# Patient Record
Sex: Male | Born: 1940 | Race: Black or African American | Hispanic: No | Marital: Married | Smoking: Former smoker
Health system: Southern US, Community
[De-identification: ages and names within clinical notes are randomized; demographics above are authoritative.]

## PROBLEM LIST (undated history)

## (undated) DIAGNOSIS — R011 Cardiac murmur, unspecified: Secondary | ICD-10-CM

## (undated) DIAGNOSIS — I639 Cerebral infarction, unspecified: Secondary | ICD-10-CM

## (undated) DIAGNOSIS — I219 Acute myocardial infarction, unspecified: Secondary | ICD-10-CM

## (undated) DIAGNOSIS — I351 Nonrheumatic aortic (valve) insufficiency: Secondary | ICD-10-CM

## (undated) DIAGNOSIS — J449 Chronic obstructive pulmonary disease, unspecified: Secondary | ICD-10-CM

## (undated) DIAGNOSIS — E559 Vitamin D deficiency, unspecified: Secondary | ICD-10-CM

## (undated) DIAGNOSIS — Z961 Presence of intraocular lens: Secondary | ICD-10-CM

## (undated) DIAGNOSIS — I10 Essential (primary) hypertension: Secondary | ICD-10-CM

## (undated) DIAGNOSIS — Z9981 Dependence on supplemental oxygen: Secondary | ICD-10-CM

## (undated) DIAGNOSIS — I340 Nonrheumatic mitral (valve) insufficiency: Secondary | ICD-10-CM

## (undated) DIAGNOSIS — R918 Other nonspecific abnormal finding of lung field: Secondary | ICD-10-CM

## (undated) DIAGNOSIS — I712 Thoracic aortic aneurysm, without rupture, unspecified: Secondary | ICD-10-CM

## (undated) DIAGNOSIS — I722 Aneurysm of renal artery: Secondary | ICD-10-CM

## (undated) DIAGNOSIS — C3490 Malignant neoplasm of unspecified part of unspecified bronchus or lung: Secondary | ICD-10-CM

## (undated) DIAGNOSIS — T8182XA Emphysema (subcutaneous) resulting from a procedure, initial encounter: Secondary | ICD-10-CM

## (undated) DIAGNOSIS — I4891 Unspecified atrial fibrillation: Secondary | ICD-10-CM

## (undated) DIAGNOSIS — J189 Pneumonia, unspecified organism: Secondary | ICD-10-CM

## (undated) DIAGNOSIS — I671 Cerebral aneurysm, nonruptured: Secondary | ICD-10-CM

## (undated) DIAGNOSIS — I509 Heart failure, unspecified: Secondary | ICD-10-CM

## (undated) DIAGNOSIS — I723 Aneurysm of iliac artery: Secondary | ICD-10-CM

## (undated) HISTORY — DX: Cerebral aneurysm, nonruptured: I67.1

## (undated) HISTORY — DX: Thoracic aortic aneurysm, without rupture, unspecified: I71.20

## (undated) HISTORY — DX: Heart failure, unspecified: I50.9

## (undated) HISTORY — DX: Essential (primary) hypertension: I10

## (undated) HISTORY — PX: CORONARY ANGIOPLASTY WITH STENT PLACEMENT: SHX49

## (undated) HISTORY — PX: AORTIC VALVE REPLACEMENT: SHX41

## (undated) HISTORY — DX: Other nonspecific abnormal finding of lung field: R91.8

## (undated) HISTORY — DX: Presence of intraocular lens: Z96.1

## (undated) HISTORY — DX: Nonrheumatic aortic (valve) insufficiency: I35.1

## (undated) HISTORY — DX: Aneurysm of iliac artery: I72.3

## (undated) HISTORY — DX: Unspecified atrial fibrillation: I48.91

## (undated) HISTORY — DX: Aneurysm of renal artery: I72.2

## (undated) HISTORY — PX: INGUINAL HERNIA REPAIR: SUR1180

## (undated) HISTORY — PX: CATARACT EXTRACTION W/ INTRAOCULAR LENS  IMPLANT, BILATERAL: SHX1307

## (undated) HISTORY — DX: Emphysema (subcutaneous) resulting from a procedure, initial encounter: T81.82XA

## (undated) HISTORY — DX: Nonrheumatic mitral (valve) insufficiency: I34.0

## (undated) HISTORY — DX: Cerebral infarction, unspecified: I63.9

## (undated) HISTORY — PX: CARDIAC VALVE REPLACEMENT: SHX585

## (undated) HISTORY — DX: Thoracic aortic aneurysm, without rupture: I71.2

---

## 1997-11-12 ENCOUNTER — Encounter: Payer: Self-pay | Admitting: Internal Medicine

## 1997-11-12 ENCOUNTER — Ambulatory Visit (HOSPITAL_COMMUNITY): Admission: RE | Admit: 1997-11-12 | Discharge: 1997-11-12 | Payer: Self-pay | Admitting: Internal Medicine

## 2005-06-01 ENCOUNTER — Inpatient Hospital Stay (HOSPITAL_COMMUNITY): Admission: EM | Admit: 2005-06-01 | Discharge: 2005-06-04 | Payer: Self-pay | Admitting: Emergency Medicine

## 2007-12-12 ENCOUNTER — Encounter: Admission: RE | Admit: 2007-12-12 | Discharge: 2007-12-12 | Payer: Self-pay | Admitting: Family Medicine

## 2007-12-15 ENCOUNTER — Encounter: Admission: RE | Admit: 2007-12-15 | Discharge: 2007-12-15 | Payer: Self-pay | Admitting: Family Medicine

## 2010-02-02 ENCOUNTER — Encounter: Payer: Self-pay | Admitting: Neurosurgery

## 2010-05-30 NOTE — Discharge Summary (Signed)
NAMESHAILEN, THIELEN NO.:  [id number]   MEDICAL RECORD NO.:  [id number]          PATIENT TYPE:  INP   LOCATION:  3101                         FACILITY:  [facility]   PHYSICIAN:  Clydene Fake, M.D.  DATE OF BIRTH:  07-07-1940   DATE OF ADMISSION:  06/01/2005  DATE OF DISCHARGE:  06/04/2005                                 DISCHARGE SUMMARY   DIAGNOSIS:  Cerebellar intracranial hemorrhage.   REASON FOR ADMISSION:  This 70 year old gentleman felt some headache,  dizziness, and ataxia.  Went to the primary medical office and found to have  high blood pressure.  Given some Lasix and sent to the ER.  Blood pressure  was controlled.  CT was done showing a left cerebellar hemorrhage, minimal  mass effect to the left side of the vermis.  The patient was admitted to ICU  for observation.   Medicine consulted for blood pressure control.  The patient continued to be  neurologically stable.  CT scan of the head was repeated on Jun 03, 2005  showing no change in the hemorrhage, except there were a little slight edema  around it, basically as expected evolution, no new mass effect, no  hydrocephalus.  Blood pressure was controlled.  Medicines were changed to  hydrochlorothiazide 25 daily and Cozaar 100 mg daily.  He remained stable.  He has been up ambulating, neurologically intact, and we will discharge him  home in fair condition.  Follow up with primary doctor in one week for  continued watching of his blood pressure, that is, Dr. Parke Simmers, and to get an  MRI with and without contract and an MRA of his brain in about one month  when the clot resolves to look for any underlying lesions that may have  caused this, but most likely it was uncontrolled hypertension causing this  hemorrhage.           ______________________________  Clydene Fake, M.D.     JRH/MEDQ  D:  06/04/2005  T:  06/04/2005  Job:  [id number]   cc:   [facility]   Renaye Rakers, M.D.  Fax:  [phone]

## 2010-05-30 NOTE — Consult Note (Signed)
[name], Lane NO.:  [id number]   MEDICAL RECORD NO.:  [id number]          PATIENT TYPE:  INP   LOCATION:  1823                         FACILITY:  [facility]   PHYSICIAN:  Lonia Blood, M.D.DATE OF BIRTH:  July 17, 1940   DATE OF CONSULTATION:  06/01/2005  DATE OF DISCHARGE:                                   CONSULTATION   INTERNAL MEDICINE CONSULTATION   DATE OF CONSULTATION:  Jun 01, 2005.   PRIMARY CARE PHYSICIAN:  Renaye Rakers, M.D.   REASON FOR CONSULTATION:  Hypertensive crisis in the setting of intracranial  hemorrhage.   HISTORY OF PRESENT ILLNESS:  Cody Lane. Cody Lane is a very pleasant 70-year-  old gentleman with a long standing history of hypertension and tobacco  abuse.  He was in his usual state of health when he awoke this morning.  He  went to work.  At work he began to suddenly feel significantly off balance  and dizzy.  He tried to relax at work and this did not improve his symptoms.  He then left work and went to see his primary care physician.  There, he was  found to have an elevated blood pressure, though the exact measurement is  not available to me at this time. He was apparently treated in the office  with an antihypertensive and told to go home for the day to relax.  He did  return to work instead but reports that his intention was to only be there  temporarily to pick up a few things.  Nonetheless, upon returning to work he  felt the symptoms had gotten much worse.  As a result, he then presented to  the emergency room for evaluation.  Work up in the emergency room has  included a CT scan which has revealed a cerebellar hemorrhage.  Neurosurgery  has evaluated the patient.  The patient is being admitted to the  neurological intensive care unit for close observation.  It is not clear at  this time if this represents simple hypertensive intracranial hemorrhage or  if there is other pathology at play.  Nonetheless, [facility]  have been consulted, as a representative of Dr. Parke Simmers, to assist with the  acute control of the patient's hypertension.   At the present time the patient is resting comfortably in the emergency room  bed.  He remarkably has no headache. He does continue to feel somewhat  dizzy.  He has no chest pain, no shortness of breath, no fever, chills, no  nausea, vomiting.  He has no other neurological symptoms.   REVIEW OF SYSTEMS:  Comprehensive review of systems is otherwise  unremarkable with the exception of the elements noted in the history of  present illness above.   PAST MEDICAL HISTORY:  1.  Hypertension.  2.  Tobacco abuse in the amount of one pack per day since teen years.  3.  Borderline alcohol use with two shots of [place] daily.   OUTPATIENT MEDICATIONS:  1.  Lexapro 20 mg daily.  2.  Micardis/hydrochlorothiazide 80/12.5 mg daily.   ALLERGIES:  No known drug allergies.   FAMILY HISTORY:  The patient's father is deceased secondary to  cerebrovascular accident in his 57's.  The patient's mother died at age 48  due to a stroke.   SOCIAL HISTORY:  The patient lives in [place].  He inspects cars for a  living.  He is married. He has no children.   LABORATORY DATA:  Hemoglobin, white count, platelet count and MCV are all  normal.  Coag's are normal.  Point of care cardiac markers are negative x1.  Sodium, potassium, chloride, bicarb, BUN, and serum glucose are all normal.  Creatinine is borderline at 1.3, though this likely represents a normal GFR  in this large gentleman.   PHYSICAL EXAMINATION:  VITAL SIGNS:  Temperature 96.7, blood pressure  187/120 initially and now 154/75.  Heart rate 75.  Respiratory rate 16.  Oxygen saturation 87% on room air initially, now 93% on 2L per minute nasal  cannula.  HEENT:  Normocephalic, atraumatic. Pupils equal, round, reactive to light  and accommodation. Extraocular movements intact, bilaterally.  NECK:  No  lymphadenopathy, no jugular venous distention.  LUNGS:  Clear to auscultation bilaterally without wheezes or rhonchi.  CARDIOVASCULAR:  Regular rate and rhythm without appreciable murmur.  ABDOMEN:  Nontender, nondistended.  Soft, bowel sounds present. No  hepatosplenomegaly.  No rebound.  No ascites.  EXTREMITIES:  No significant cyanosis, clubbing or edema bilateral lower  extremities.  NEUROLOGICAL:  5/5 strength bilateral upper extremities and lower  extremities.  Intact sensation to touch throughout.  No Babinski.  Cranial  nerves II-XII are actually intact bilaterally.   ASSESSMENT AND RECOMMENDATIONS:  1.  Hypertensive crisis.  The patient has been dosed with intravenous      Lopressor during his emergency room stay.  This has done a great job in      controlling his blood pressure.  In the setting of long term      hypertension with an acute intracranial hemorrhage, we do not wish to      over-correct the patient's elevated blood pressure.  Our goal will be to      maintain his systolic blood pressure in the 140-160 range.  As the      patient's blood pressure has currently become well controlled on      intravenous beta blocker, I see no reason to change that treatment plan      at the present time.  I agree with continuing his p.o. Micardis.  I      agree with ART line for the most reliable and accurate blood pressure      monitoring.  If the patient's blood pressure becomes difficult to      control on his current regimen, my recommendation would be to initiate      intravenous nitroglycerin to allow for more aggressive control.  If this      fails, my next step would be Nipride.  2.  Mild hypoxia.  At presentation, the patient had an oxygen saturation of      87% on room air.  There is no clear reason for this.  At present he is      in the 93 to 95% range on 2L per minute nasal cannula.  There is no      evidence of jugular venous distention to suggest congestive heart      failure.  The possibility of some pulmonary edema related to the      patient's uncontrolled hypertension is  possible.  Physical examination,      however, does not reveal significant crackles.  We will simply monitor      his oxygen saturation's and followup with a chest x-ray in the morning.  3.  Tobacco abuse.  I have counseled the patient on the need to discontinue      tobacco use.  Tobacco cessation consultation will be requested.  4.  Marginal alcohol use. I have counseled the patient that one shot of      liquor a day would be a more appropriate usage and would help him avoid      potential long term side effects of excessive alcohol use.  He does not,      however, display symptoms of true alcoholism at the present time.      Lonia Blood, M.D.  Electronically Signed     JTM/MEDQ  D:  06/01/2005  T:  06/01/2005  Job:  [id number]   cc:   Renaye Rakers, M.D.  Fax: [phone]

## 2011-07-23 DIAGNOSIS — I671 Cerebral aneurysm, nonruptured: Secondary | ICD-10-CM

## 2011-07-23 HISTORY — DX: Cerebral aneurysm, nonruptured: I67.1

## 2013-08-17 DIAGNOSIS — Z961 Presence of intraocular lens: Secondary | ICD-10-CM

## 2013-08-17 HISTORY — DX: Presence of intraocular lens: Z96.1

## 2014-01-26 DIAGNOSIS — I340 Nonrheumatic mitral (valve) insufficiency: Secondary | ICD-10-CM

## 2014-01-26 DIAGNOSIS — I351 Nonrheumatic aortic (valve) insufficiency: Secondary | ICD-10-CM

## 2014-01-26 DIAGNOSIS — I509 Heart failure, unspecified: Secondary | ICD-10-CM

## 2014-01-26 HISTORY — DX: Nonrheumatic aortic (valve) insufficiency: I35.1

## 2014-01-26 HISTORY — DX: Nonrheumatic mitral (valve) insufficiency: I34.0

## 2014-01-26 HISTORY — DX: Heart failure, unspecified: I50.9

## 2014-03-26 ENCOUNTER — Encounter: Payer: Self-pay | Admitting: *Deleted

## 2014-03-26 ENCOUNTER — Non-Acute Institutional Stay (SKILLED_NURSING_FACILITY): Payer: Medicare Other | Admitting: Adult Health

## 2014-03-26 ENCOUNTER — Encounter: Payer: Self-pay | Admitting: Adult Health

## 2014-03-26 DIAGNOSIS — Z7901 Long term (current) use of anticoagulants: Secondary | ICD-10-CM

## 2014-03-26 DIAGNOSIS — I10 Essential (primary) hypertension: Secondary | ICD-10-CM | POA: Diagnosis not present

## 2014-03-26 DIAGNOSIS — R5381 Other malaise: Secondary | ICD-10-CM | POA: Diagnosis not present

## 2014-03-26 DIAGNOSIS — G4700 Insomnia, unspecified: Secondary | ICD-10-CM

## 2014-03-26 DIAGNOSIS — I509 Heart failure, unspecified: Secondary | ICD-10-CM

## 2014-03-26 DIAGNOSIS — I4891 Unspecified atrial fibrillation: Secondary | ICD-10-CM | POA: Diagnosis not present

## 2014-03-26 DIAGNOSIS — I351 Nonrheumatic aortic (valve) insufficiency: Secondary | ICD-10-CM | POA: Diagnosis not present

## 2014-03-26 DIAGNOSIS — E785 Hyperlipidemia, unspecified: Secondary | ICD-10-CM | POA: Insufficient documentation

## 2014-03-26 DIAGNOSIS — N2889 Other specified disorders of kidney and ureter: Secondary | ICD-10-CM | POA: Insufficient documentation

## 2014-03-26 DIAGNOSIS — I4821 Permanent atrial fibrillation: Secondary | ICD-10-CM | POA: Insufficient documentation

## 2014-03-26 NOTE — Progress Notes (Signed)
Patient ID: Cody Lane, male   DOB: Sep 06, 1940, 74 y.o.   MRN: [id number]   03/26/2014  Facility:  [facility] Location:  [place] Room Number: 1204-P LEVEL OF CARE:  SNF (31)   Chief Complaint  Patient presents with  . Hospitalization Follow-up    Aortic valve insufficiency S/P aortic valve replacement, kidney mass, hypertension long-term use of anticoagulant, atrial fibrillation, hyperlipidemia, CHF and insomnia    HISTORY OF PRESENT ILLNESS:  This is a 74 year old male who was admitted to [facility] on 03/23/14 from [place]. He was brought to the hospital by a friend due to falls twice a day and was having agitated behavior. He was noted to be easily winded with just few steps. He was noncompliant with meds and EF of 45%. He had aortic valve insufficiency and had aortic valve replacement done on 03/08/14. He has past medical history of hypertension, hyperlipidemia and insomnia. He has been admitted for a short-term rehabilitation.  PAST MEDICAL HISTORY:  Past Medical History  Diagnosis Date  . Hypertension   . 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 Aortic valve regurgitation 01/26/14  . Cataract 08/17/2013  . CHF (congestive heart failure) 01/26/2014  . Mitral valve regurgitation 01/26/2014  . Pseudophakia 08/17/2013    CURRENT MEDICATIONS: Reviewed per MAR/see medication list  No Known Allergies   REVIEW OF SYSTEMS:  GENERAL: no change in appetite, no fatigue, no weight changes, no fever, chills or weakness RESPIRATORY: no cough, SOB, DOE, wheezing, hemoptysis CARDIAC: no chest pain, edema or palpitations GI: no abdominal pain, diarrhea, constipation, heart burn, nausea or vomiting  PHYSICAL EXAMINATION  GENERAL: no acute distress, normal body habitus SKIN:  Chest midline incision with staples, bilateral abdominal upper quadrants noted to sutures, dry, no erythema EYES: conjunctivae normal, sclerae normal, normal eye lids NECK: supple,  trachea midline, no neck masses, no thyroid tenderness, no thyromegaly LYMPHATICS: no LAN in the neck, no supraclavicular LAN RESPIRATORY: breathing is even & unlabored, BS CTAB CARDIAC: RRR, no murmur,no extra heart sounds, no edema GI: abdomen soft, normal BS, no masses, no tenderness, no hepatomegaly, no splenomegaly EXTREMITIES: Able to move 4 extremities PSYCHIATRIC: the patient is alert & oriented to person, affect & behavior appropriate  LABS/RADIOLOGY: 02/28/14  creatinine 2.0 glucose 117 BUN 54 sodium 139 potassium 4.3 calcium 8.9 SGOT 112 SGPT 120 magnesium 2.4 WBC 4.5 hemoglobin 14.2 hematocrit 42.8 MCV 95 Chest x-ray shows emphysema; no consolidative opacities nor pleural effusions CT head shows no acute intracranial hemorrhage nor mass effect  ASSESSMENT/PLAN:  Physical deconditioning - for rehabilitation Aortic valve insufficiency S/P Aortic Valve Replacement - monitor at surgical site for any signs of infection; continue oxycodone 5 mg 1 tab by mouth every 4 hours when necessary Atrial fibrillation - rate controlled; continue amiodarone 200 mg by mouth daily and metoprolol 37.5 mg by mouth every 12 hours Kidney mass - incidental finding; will need CT when stable Hypertension - well controlled; continue amlodipine 10 mg by mouth daily and metoprolol 37.5 mg by mouth every 12 hours; BP/HR BID X 1 week CHF - continue Lasix 20 mg by mouth daily; weigh Q Mondays and Wednesdays Hyperlipidemia - continue atorvastatin 20 mg by mouth daily at bedtime Insomnia - continue melatonin 3 mg by mouth every morning and trazodone 50 mg by mouth daily at bedtime when necessary Long-term use of anticoagulant - INR 2.2 - therapeutic; Coumadin held 3 days due to supratherapeutic INR; decrease Coumadin to 6.5 mg by mouth  daily and recheck INR on 03/30/14     Goals of care:  Short-term rehabilitation   Labs/test ordered:  CBC and CMP  Spent 50 minutes in patient  care.   [facility], NP [employer] [phone]

## 2014-03-27 ENCOUNTER — Encounter: Payer: Self-pay | Admitting: *Deleted

## 2014-03-28 ENCOUNTER — Non-Acute Institutional Stay (SKILLED_NURSING_FACILITY): Payer: Medicare Other | Admitting: Internal Medicine

## 2014-03-28 DIAGNOSIS — G4700 Insomnia, unspecified: Secondary | ICD-10-CM

## 2014-03-28 DIAGNOSIS — I351 Nonrheumatic aortic (valve) insufficiency: Secondary | ICD-10-CM

## 2014-03-28 DIAGNOSIS — E785 Hyperlipidemia, unspecified: Secondary | ICD-10-CM

## 2014-03-28 DIAGNOSIS — I4891 Unspecified atrial fibrillation: Secondary | ICD-10-CM | POA: Diagnosis not present

## 2014-03-28 DIAGNOSIS — R5381 Other malaise: Secondary | ICD-10-CM

## 2014-03-28 DIAGNOSIS — I10 Essential (primary) hypertension: Secondary | ICD-10-CM | POA: Diagnosis not present

## 2014-03-28 DIAGNOSIS — N2889 Other specified disorders of kidney and ureter: Secondary | ICD-10-CM | POA: Diagnosis not present

## 2014-03-28 DIAGNOSIS — I509 Heart failure, unspecified: Secondary | ICD-10-CM | POA: Diagnosis not present

## 2014-03-29 NOTE — Progress Notes (Signed)
Patient ID: Cody Lane, male   DOB: 04/16/1940, 74 y.o.   MRN: [id number]     [facility]   PCP: No primary care provider on file.  Code Status: full code  No Known Allergies  Chief Complaint  Patient presents with  . New Admit To SNF     HPI:  74 year old patient is here for short term rehabilitation post hospital admission from [place] 02/28/14-03/23/14. He had aortic insufficiency and underwent aortic valve replacement. He was diagnosed with new  Left renal mass this admission. He is seen in his room today. He complaints of trouble with sleeping at night. Denies any other concerns. He has past medical history of hypertension, hyperlipidemia and insomnia.  Review of Systems:  Constitutional: Negative for fever, chills, diaphoresis.  HENT: Negative for headache, congestion, nasal discharge.  Respiratory: Negative for cough, shortness of breath and wheezing.   Cardiovascular: Negative for chest pain, palpitations, leg swelling.  Gastrointestinal: Negative for heartburn, nausea, vomiting, abdominal pain. Had bowel movement this am Genitourinary: Negative for dysuria Musculoskeletal: Negative for falls Psychiatric/Behavioral: Negative for depression  Past Medical History  Diagnosis Date  . Hypertension   . 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 Aortic valve regurgitation 01/26/14  . Cataract 08/17/2013  . CHF (congestive heart failure) 01/26/2014  . Mitral valve regurgitation 01/26/2014  . Pseudophakia 08/17/2013   Past Surgical History  Procedure Laterality Date  . Aortic valve replacement     Social History:   has no tobacco, alcohol, and drug history on file.  Family History  Problem Relation Age of Onset  . Family history unknown: Yes    Medications: Patient's Medications  New Prescriptions   No medications on file  Previous Medications   ACETAMINOPHEN (TYLENOL) 325 MG TABLET    Take 650 mg by mouth every 8 (eight) hours as  needed. 975 mg PO Q 8 hours PRN   ALBUTEROL (PROVENTIL HFA;VENTOLIN HFA) 108 (90 BASE) MCG/ACT INHALER    Inhale 2 puffs into the lungs every 6 (six) hours as needed for wheezing or shortness of breath.   AMIODARONE (PACERONE) 200 MG TABLET    Take 200 mg by mouth daily.   AMLODIPINE (NORVASC) 10 MG TABLET    Take 10 mg by mouth daily.   ASPIRIN EC 81 MG TABLET    Take 81 mg by mouth daily.   ATORVASTATIN (LIPITOR) 20 MG TABLET    Take 20 mg by mouth at bedtime.   FUROSEMIDE (LASIX) 20 MG TABLET    Take 20 mg by mouth daily.   MELATONIN 3 MG TABS    Take 3 mg by mouth at bedtime.   METOPROLOL TARTRATE (LOPRESSOR) 25 MG TABLET    Take 25 mg by mouth 2 (two) times daily. Take 1 1/2 tab of 25 mg/tab = 37.5 mg PO Q 12 hours   MULTIPLE VITAMINS-MINERALS (MULTIVITAMIN & MINERAL PO)    Take 1 tablet by mouth daily.   OXYCODONE (OXY IR/ROXICODONE) 5 MG IMMEDIATE RELEASE TABLET    Take 5 mg by mouth every 4 (four) hours as needed for severe pain.   THIAMINE 100 MG TABLET    Take 100 mg by mouth daily.   TRAZODONE (DESYREL) 50 MG TABLET    Take 50 mg by mouth at bedtime as needed for sleep.   WARFARIN (COUMADIN) 2.5 MG TABLET    Take 2.5 mg by mouth daily. 6.5 mg PO Q D  Modified Medications   No  medications on file  Discontinued Medications   No medications on file     Physical Exam: Filed Vitals:   03/28/14 1742  BP: 136/84  Pulse: 69  Temp: 96.7 F (35.9 C)  Resp: 16  SpO2: 95%    General- elderly male, in no acute distress Head- normocephalic, atraumatic Throat- moist mucus membrane Neck- no cervical lymphadenopathy Cardiovascular- normal s1,s2, no murmurs, palpable dorsalis pedis and radial pulses, trace leg edema Respiratory- bilateral clear to auscultation, no wheeze, no rhonchi, no crackles, no use of accessory muscles Abdomen- bowel sounds present, soft, non tender Musculoskeletal- able to move all 4 extremities, generalized weakness  Neurological- no focal deficit Skin- warm  and dry, sternal incision with staples in place, 2 abdominal incision with suture, groin incision healing well Psychiatry- alert and oriented to person, place and time, normal mood and affect  LABS/RADIOLOGY: 02/28/14  creatinine 2.0 glucose 117 BUN 54 sodium 139 potassium 4.3 calcium 8.9 SGOT 112 SGPT 120 magnesium 2.4 WBC 4.5 hemoglobin 14.2 hematocrit 42.8 MCV 95 Chest x-ray shows emphysema; no consolidative opacities nor pleural effusions CT head shows no acute intracranial hemorrhage nor mass effect  Assessment/Plan  Physical deconditioning Will have him work with physical therapy and occupational therapy team to help with gait training and muscle strengthening exercises.fall precautions. Skin care. Encourage to be out of bed.   Aortic valve insufficiency  S/P Aortic Valve Replacement. Has f/u with cardiology. To work with therapy team. continue oxycodone 5 mg 1 q4h prn pain, continue skin care  Atrial fibrillation rate controlled. continue amiodarone 200 mg daily and metoprolol 37.5 mg bid. inr today 2.2. Continue coumadin 6 mg daily and recheck inr 03/30/14  Kidney mass Incidental finding, will need f/u with ct scan once strength improves  Hypertension  continue amlodipine 10 mg daily and metoprolol 37.5 mg bid, monitor bp  CHF continue Lasix 20 mg daily with metoprolol 37.5 bid, monitor weight biweekly  Hyperlipidemia continue atorvastatin 20 mg daily  Insomnia continue melatonin 3 mg daily and prn trazodone   Goals of care: short term rehabilitation   Labs/tests ordered: cbc, cmp  Family/ staff Communication: reviewed care plan with patient and nursing supervisor    Blanchie Serve, MD  [facility] [phone] (Monday-Friday 8 am - 5 pm) [phone] (afterhours)

## 2014-04-06 ENCOUNTER — Emergency Department (HOSPITAL_COMMUNITY)
Admission: EM | Admit: 2014-04-06 | Discharge: 2014-04-07 | Disposition: A | Discharge status: Home / Self Care | Payer: Medicare Other | Source: Home / Self Care | Attending: Emergency Medicine | Admitting: Emergency Medicine

## 2014-04-06 ENCOUNTER — Emergency Department (HOSPITAL_COMMUNITY)
Admission: EM | Admit: 2014-04-06 | Discharge: 2014-04-06 | Disposition: A | Discharge status: Home / Self Care | Payer: Medicare Other | Attending: Emergency Medicine | Admitting: Emergency Medicine

## 2014-04-06 ENCOUNTER — Encounter (HOSPITAL_COMMUNITY): Payer: Self-pay

## 2014-04-06 ENCOUNTER — Encounter (HOSPITAL_COMMUNITY): Payer: Self-pay | Admitting: Emergency Medicine

## 2014-04-06 DIAGNOSIS — R040 Epistaxis: Secondary | ICD-10-CM

## 2014-04-06 DIAGNOSIS — I340 Nonrheumatic mitral (valve) insufficiency: Secondary | ICD-10-CM | POA: Insufficient documentation

## 2014-04-06 DIAGNOSIS — E559 Vitamin D deficiency, unspecified: Secondary | ICD-10-CM | POA: Insufficient documentation

## 2014-04-06 DIAGNOSIS — Z87891 Personal history of nicotine dependence: Secondary | ICD-10-CM | POA: Insufficient documentation

## 2014-04-06 DIAGNOSIS — Z9889 Other specified postprocedural states: Secondary | ICD-10-CM | POA: Insufficient documentation

## 2014-04-06 DIAGNOSIS — I10 Essential (primary) hypertension: Secondary | ICD-10-CM

## 2014-04-06 DIAGNOSIS — Z7901 Long term (current) use of anticoagulants: Secondary | ICD-10-CM

## 2014-04-06 DIAGNOSIS — Z79899 Other long term (current) drug therapy: Secondary | ICD-10-CM | POA: Diagnosis not present

## 2014-04-06 DIAGNOSIS — I509 Heart failure, unspecified: Secondary | ICD-10-CM

## 2014-04-06 DIAGNOSIS — Z7982 Long term (current) use of aspirin: Secondary | ICD-10-CM | POA: Insufficient documentation

## 2014-04-06 DIAGNOSIS — I351 Nonrheumatic aortic (valve) insufficiency: Secondary | ICD-10-CM | POA: Insufficient documentation

## 2014-04-06 HISTORY — DX: Vitamin D deficiency, unspecified: E55.9

## 2014-04-06 LAB — CBC WITH DIFFERENTIAL/PLATELET
BASOS ABS: 0.1 10*3/uL (ref 0.0–0.1)
Basophils Relative: 1 % (ref 0–1)
EOS PCT: 6 % — AB (ref 0–5)
Eosinophils Absolute: 0.6 10*3/uL (ref 0.0–0.7)
HEMATOCRIT: 37.4 % — AB (ref 39.0–52.0)
Hemoglobin: 11.9 g/dL — ABNORMAL LOW (ref 13.0–17.0)
LYMPHS PCT: 26 % (ref 12–46)
Lymphs Abs: 2.2 10*3/uL (ref 0.7–4.0)
MCH: 30.8 pg (ref 26.0–34.0)
MCHC: 31.8 g/dL (ref 30.0–36.0)
MCV: 96.9 fL (ref 78.0–100.0)
MONO ABS: 0.6 10*3/uL (ref 0.1–1.0)
Monocytes Relative: 7 % (ref 3–12)
Neutro Abs: 5.2 10*3/uL (ref 1.7–7.7)
Neutrophils Relative %: 60 % (ref 43–77)
Platelets: 243 10*3/uL (ref 150–400)
RBC: 3.86 MIL/uL — ABNORMAL LOW (ref 4.22–5.81)
RDW: 13.9 % (ref 11.5–15.5)
WBC: 8.7 10*3/uL (ref 4.0–10.5)

## 2014-04-06 LAB — PROTIME-INR
INR: 1.32 (ref 0.00–1.49)
PROTHROMBIN TIME: 16.5 s — AB (ref 11.6–15.2)

## 2014-04-06 MED ORDER — HYDROCODONE-ACETAMINOPHEN 5-325 MG PO TABS
2.0000 | ORAL_TABLET | Freq: Once | ORAL | Status: AC
  Administered 2014-04-06: 2 via ORAL
  Filled 2014-04-06: qty 2

## 2014-04-06 MED ORDER — OXYMETAZOLINE HCL 0.05 % NA SOLN
1.0000 | Freq: Once | NASAL | Status: AC
  Administered 2014-04-06: 1 via NASAL
  Filled 2014-04-06: qty 15

## 2014-04-06 NOTE — ED Notes (Signed)
[facility] paged for transport

## 2014-04-06 NOTE — ED Notes (Signed)
Attempted to d'c pt, he is requesting to see Dr Jeneen Rinks, he c/o pain and wants packing removed. Dr Jeneen Rinks notified.

## 2014-04-06 NOTE — ED Notes (Signed)
Patient from [facility]

## 2014-04-06 NOTE — ED Notes (Signed)
Per EMS pt is a resident at [facility]  EMS called out tonight for epistaxis  Upon their arrival pt had no active bleeding  B/P 135/90  Pt started taking coumadin a couple weeks ago  Pt has been having off and on bleeding for the past 2 days  As EMS was pulling in to parking lot pt started having bleeding from left nare

## 2014-04-06 NOTE — ED Notes (Signed)
Report given to Kathlee Nations, Librarian, academic at [employer].

## 2014-04-06 NOTE — ED Notes (Signed)
Patients presents with nosebleed/seen here yesterday for same and packing placed.  Per [facility], oozing blood around packing since 1500 this afternoon.  Per [facility], states that they stopped Coumadin for one dose, but patient continued on aspirin.

## 2014-04-06 NOTE — ED Notes (Signed)
Bed: QX45 Expected date:  Expected time:  Means of arrival:  Comments: EMS 74 yo from SNF-nosebleed-on blood thinner

## 2014-04-06 NOTE — ED Provider Notes (Signed)
CSN: [id number]     Arrival date & time 04/06/14  0509 History   First MD Initiated Contact with Patient 04/06/14 [phone]     Chief Complaint  Patient presents with  . Epistaxis     HPI  She presents evaluation of bleeding. Has had bleeding from his left naris intimately since she has today. Worse through the night. He resides at [facility] facility. He is anticoagulated with warfarin for history of aortic valve replacement. Not dizzy lightheaded or syncopal. Has not been having any other episodes of bleeding. No blood in his stools or blood in his urine. Does bruise.  Past Medical History  Diagnosis Date  . Hypertension   . 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 Aortic valve regurgitation 01/26/14  . Cataract 08/17/2013  . CHF (congestive heart failure) 01/26/2014  . Mitral valve regurgitation 01/26/2014  . Pseudophakia 08/17/2013  . Vitamin D deficiency    Past Surgical History  Procedure Laterality Date  . Aortic valve replacement     Family History  Problem Relation Age of Onset  . Family history unknown: Yes   History  Substance Use Topics  . Smoking status: Former Research scientist (life sciences)  . Smokeless tobacco: Not on file  . Alcohol Use: No    Review of Systems  Constitutional: Negative for fever, chills, diaphoresis, appetite change and fatigue.  HENT: Positive for nosebleeds. Negative for mouth sores, sore throat and trouble swallowing.   Eyes: Negative for visual disturbance.  Respiratory: Negative for cough, chest tightness, shortness of breath and wheezing.   Cardiovascular: Negative for chest pain.  Gastrointestinal: Negative for nausea, vomiting, abdominal pain, diarrhea and abdominal distention.  Endocrine: Negative for polydipsia, polyphagia and polyuria.  Genitourinary: Negative for dysuria, frequency and hematuria.  Musculoskeletal: Negative for gait problem.  Skin: Negative for color change, pallor and rash.  Neurological: Negative for dizziness, syncope,  light-headedness and headaches.  Hematological: Bruises/bleeds easily.  Psychiatric/Behavioral: Negative for behavioral problems and confusion.      Allergies  Review of patient's allergies indicates no known allergies.  Home Medications   Prior to Admission medications   Medication Sig Start Date End Date Taking? Authorizing Provider  acetaminophen (TYLENOL) 325 MG tablet Take 650 mg by mouth every 8 (eight) hours as needed. 975 mg PO Q 8 hours PRN   Yes Historical Provider, MD  albuterol (PROVENTIL HFA;VENTOLIN HFA) 108 (90 BASE) MCG/ACT inhaler Inhale 2 puffs into the lungs every 6 (six) hours as needed for wheezing or shortness of breath.   Yes Historical Provider, MD  amiodarone (PACERONE) 200 MG tablet Take 200 mg by mouth daily.   Yes Historical Provider, MD  amLODipine (NORVASC) 10 MG tablet Take 10 mg by mouth daily.   Yes Historical Provider, MD  aspirin EC 81 MG tablet Take 81 mg by mouth daily.   Yes Historical Provider, MD  atorvastatin (LIPITOR) 20 MG tablet Take 20 mg by mouth at bedtime.   Yes Historical Provider, MD  furosemide (LASIX) 20 MG tablet Take 20 mg by mouth daily.   Yes Historical Provider, MD  Melatonin 3 MG TABS Take 3 mg by mouth at bedtime.   Yes Historical Provider, MD  metoprolol tartrate (LOPRESSOR) 25 MG tablet Take 25 mg by mouth 2 (two) times daily. Take 1 1/2 tab of 25 mg/tab = 37.5 mg PO Q 12 hours   Yes Historical Provider, MD  Multiple Vitamins-Minerals (MULTIVITAMIN & MINERAL PO) Take 1 tablet by mouth daily.   Yes Historical Provider, MD  oxyCODONE (OXY IR/ROXICODONE) 5 MG immediate release tablet Take 5 mg by mouth every 4 (four) hours as needed for severe pain.   Yes Historical Provider, MD  sennosides-docusate sodium (SENOKOT-S) 8.6-50 MG tablet Take 1 tablet by mouth every 12 (twelve) hours.   Yes Historical Provider, MD  thiamine 100 MG tablet Take 100 mg by mouth daily.   Yes Historical Provider, MD  traZODone (DESYREL) 50 MG tablet Take 50  mg by mouth at bedtime as needed for sleep.   Yes Historical Provider, MD  warfarin (COUMADIN) 5 MG tablet Take 5 mg by mouth daily.   Yes Historical Provider, MD   BP 118/96 mmHg  Pulse 76  Temp(Src) 98.2 F (36.8 C) (Oral)  Resp 18  SpO2 91% Physical Exam  Constitutional: He is oriented to person, place, and time. He appears well-developed and well-nourished. No distress.  HENT:  Head: Normocephalic.  Nose:    Mouth/Throat:    Eyes: Conjunctivae are normal. Pupils are equal, round, and reactive to light. No scleral icterus.  Neck: Normal range of motion. Neck supple. No thyromegaly present.  Cardiovascular: Normal rate and regular rhythm.  Exam reveals no gallop and no friction rub.   No murmur heard. Pulmonary/Chest: Effort normal and breath sounds normal. No respiratory distress. He has no wheezes. He has no rales.  Abdominal: Soft. Bowel sounds are normal. He exhibits no distension. There is no tenderness. There is no rebound.  Musculoskeletal: Normal range of motion.  Neurological: He is alert and oriented to person, place, and time.  Skin: Skin is warm and dry. No rash noted.  Psychiatric: He has a normal mood and affect. His behavior is normal.    ED Course  Procedures (including critical care time) Labs Review Labs Reviewed  CBC WITH DIFFERENTIAL/PLATELET - Abnormal; Notable for the following:    RBC 3.86 (*)    Hemoglobin 11.9 (*)    HCT 37.4 (*)    Eosinophils Relative 6 (*)    All other components within normal limits  PROTIME-INR - Abnormal; Notable for the following:    Prothrombin Time 16.5 (*)    All other components within normal limits    Imaging Review No results found.   EKG Interpretation None      MDM   Final diagnoses:  Epistaxis    Contemplation of the left naris for 10 minutes. Then removed. Bleeding noted on the anterior septum. Merocel gauze placed and infiltrated with Neo-Synephrine. After 30 minutes no residual bleeding noted.  Posterior pharynx normal. No clots no bleeding. INR only 1.32. Platelet count adequate at 243. Plan is home. Humidity. ENT follow-up. Return with any re- bleeding.    Tanna Furry, MD 04/06/14 [phone]

## 2014-04-06 NOTE — Discharge Instructions (Signed)
Hold your Coumadin for 48 hours, then restart. Do not remove gauze. Call Dr. Simeon Craft for appointment next week to remove gauze, and discuss cautery. If bleeding recurrs, pinch nose for 15 minutes. If bleeding continues, return to ER  Nosebleed A nosebleed can be caused by many things, including:  Getting hit hard in the nose.  Infections.  Dry nose.  Colds.  Medicines. Your doctor may do lab testing if you get nosebleeds a lot and the cause is not known. HOME CARE   If your nose was packed with material, keep it there until your doctor takes it out. Put the pack back in your nose if the pack falls out.  Do not blow your nose for 12 hours after the nosebleed.  Sit up and bend forward if your nose starts bleeding again. Pinch the front half of your nose nonstop for 20 minutes.  Put petroleum jelly inside your nose every morning if you have a dry nose.  Use a humidifier to make the air less dry.  Do not take aspirin.  Try not to strain, lift, or bend at the waist for many days after the nosebleed. GET HELP RIGHT AWAY IF:   Nosebleeds keep happening and are hard to stop or control.  You have bleeding or bruises that are not normal on other parts of the body.  You have a fever.  The nosebleeds get worse.  You get lightheaded, feel faint, sweaty, or throw up (vomit) blood. MAKE SURE YOU:   Understand these instructions.  Will watch your condition.  Will get help right away if you are not doing well or get worse. Document Released: 10/08/2007 Document Revised: 03/23/2011 Document Reviewed: 10/08/2007 [facility] Patient Information 2015 [place], [place]. This information is not intended to replace advice given to you by your health care provider. Make sure you discuss any questions you have with your health care provider.

## 2014-04-06 NOTE — ED Notes (Signed)
Bleeding to left nare controlled at this time.

## 2014-04-07 LAB — CBC
HCT: 38.7 % — ABNORMAL LOW (ref 39.0–52.0)
HEMOGLOBIN: 12.4 g/dL — AB (ref 13.0–17.0)
MCH: 31.2 pg (ref 26.0–34.0)
MCHC: 32 g/dL (ref 30.0–36.0)
MCV: 97.2 fL (ref 78.0–100.0)
Platelets: 239 10*3/uL (ref 150–400)
RBC: 3.98 MIL/uL — ABNORMAL LOW (ref 4.22–5.81)
RDW: 14 % (ref 11.5–15.5)
WBC: 8.7 10*3/uL (ref 4.0–10.5)

## 2014-04-07 LAB — PROTIME-INR
INR: 1.23 (ref 0.00–1.49)
Prothrombin Time: 15.7 seconds — ABNORMAL HIGH (ref 11.6–15.2)

## 2014-04-07 MED ORDER — TRANEXAMIC ACID 100 MG/ML IV SOLN
500.0000 mg | Freq: Once | INTRAVENOUS | Status: DC
  Filled 2014-04-07: qty 10

## 2014-04-07 NOTE — Discharge Instructions (Signed)
Nosebleed Cody Lane, keep the packing in place until you see ENT in the next 3 days. Your blood counts today are normal. If your bleeding returns and he cannot stop it at home come back to emergency department immediately. Thank you. A nosebleed can be caused by many things, including:  Getting hit hard in the nose.  Infections.  Dry nose.  Colds.  Medicines. Your doctor may do lab testing if you get nosebleeds a lot and the cause is not known. HOME CARE   If your nose was packed with material, keep it there until your doctor takes it out. Put the pack back in your nose if the pack falls out.  Do not blow your nose for 12 hours after the nosebleed.  Sit up and bend forward if your nose starts bleeding again. Pinch the front half of your nose nonstop for 20 minutes.  Put petroleum jelly inside your nose every morning if you have a dry nose.  Use a humidifier to make the air less dry.  Do not take aspirin.  Try not to strain, lift, or bend at the waist for many days after the nosebleed. GET HELP RIGHT AWAY IF:   Nosebleeds keep happening and are hard to stop or control.  You have bleeding or bruises that are not normal on other parts of the body.  You have a fever.  The nosebleeds get worse.  You get lightheaded, feel faint, sweaty, or throw up (vomit) blood. MAKE SURE YOU:   Understand these instructions.  Will watch your condition.  Will get help right away if you are not doing well or get worse. Document Released: 10/08/2007 Document Revised: 03/23/2011 Document Reviewed: 10/08/2007 [facility] Patient Information 2015 [place], [place]. This information is not intended to replace advice given to you by your health care provider. Make sure you discuss any questions you have with your health care provider.

## 2014-04-07 NOTE — ED Provider Notes (Signed)
CSN: [id number]     Arrival date & time 04/06/14  2344 History   First MD Initiated Contact with Patient 04/07/14 0113     Chief Complaint  Patient presents with  . Epistaxis     (Consider location/radiation/quality/duration/timing/severity/associated sxs/prior Treatment) HPI Cody Lane is a 74 y.o. male with past medical history of hypertension, CHF presenting today with epistaxis.  Patient was recent seen in this emergency department for the same, he was discharged home with a nasal packing. He states that he was not able to sleep all night tonight because he began bleeding again. Patient is on Coumadin but one dose was held last night. Patient states that his bleeding never stopped throughout the last 2 days however notes suggest otherwise. Patient is currently still complaining of epistaxis in the room. He denies any lightheadedness or shortness of breath. Patient has no pain anywhere. He has no further complaints.  10 Systems reviewed and are negative for acute change except as noted in the HPI.    Past Medical History  Diagnosis Date  . Hypertension   . 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 Aortic valve regurgitation 01/26/14  . Cataract 08/17/2013  . CHF (congestive heart failure) 01/26/2014  . Mitral valve regurgitation 01/26/2014  . Pseudophakia 08/17/2013  . Vitamin D deficiency    Past Surgical History  Procedure Laterality Date  . Aortic valve replacement     Family History  Problem Relation Age of Onset  . Family history unknown: Yes   History  Substance Use Topics  . Smoking status: Former Research scientist (life sciences)  . Smokeless tobacco: Not on file  . Alcohol Use: No    Review of Systems    Allergies  Review of patient's allergies indicates no known allergies.  Home Medications   Prior to Admission medications   Medication Sig Start Date End Date Taking? Authorizing Provider  acetaminophen (TYLENOL) 325 MG tablet Take 975 mg by mouth every 8 (eight) hours as needed for  mild pain.    Yes Historical Provider, MD  albuterol (PROVENTIL HFA;VENTOLIN HFA) 108 (90 BASE) MCG/ACT inhaler Inhale 2 puffs into the lungs every 6 (six) hours as needed for wheezing or shortness of breath.   Yes Historical Provider, MD  amiodarone (PACERONE) 200 MG tablet Take 200 mg by mouth daily.   Yes Historical Provider, MD  amLODipine (NORVASC) 10 MG tablet Take 10 mg by mouth daily.   Yes Historical Provider, MD  aspirin EC 81 MG tablet Take 81 mg by mouth daily.   Yes Historical Provider, MD  atorvastatin (LIPITOR) 20 MG tablet Take 20 mg by mouth at bedtime.   Yes Historical Provider, MD  furosemide (LASIX) 20 MG tablet Take 20 mg by mouth daily.   Yes Historical Provider, MD  Melatonin 3 MG TABS Take 3 mg by mouth at bedtime.   Yes Historical Provider, MD  metoprolol tartrate (LOPRESSOR) 25 MG tablet Take 37.5 mg by mouth 2 (two) times daily.    Yes Historical Provider, MD  Multiple Vitamins-Minerals (MULTIVITAMIN & MINERAL PO) Take 1 tablet by mouth daily.   Yes Historical Provider, MD  oxyCODONE (OXY IR/ROXICODONE) 5 MG immediate release tablet Take 5 mg by mouth every 4 (four) hours as needed for severe pain.   Yes Historical Provider, MD  sennosides-docusate sodium (SENOKOT-S) 8.6-50 MG tablet Take 1 tablet by mouth every 12 (twelve) hours.   Yes Historical Provider, MD  thiamine 100 MG tablet Take 100 mg by mouth daily.   Yes Historical Provider,  MD  traZODone (DESYREL) 50 MG tablet Take 50 mg by mouth at bedtime as needed for sleep.   Yes Historical Provider, MD  warfarin (COUMADIN) 5 MG tablet Take 5 mg by mouth daily.    Historical Provider, MD   BP 133/98 mmHg  Pulse 77  Temp(Src) 97.4 F (36.3 C) (Oral)  Resp 20  SpO2 90% Physical Exam  Constitutional: He is oriented to person, place, and time. Vital signs are normal. He appears well-developed and well-nourished.  Non-toxic appearance. He does not appear ill. No distress.  HENT:  Head: Normocephalic and atraumatic.   Mouth/Throat: No oropharyngeal exudate.  Bilateral nares with dried blood, left Carlton Adam has packing in place. No active bleeding seen in the oropharynx.  Eyes: Conjunctivae and EOM are normal. Pupils are equal, round, and reactive to light. No scleral icterus.  Neck: Normal range of motion. Neck supple. No tracheal deviation, no edema, no erythema and normal range of motion present. No thyroid mass and no thyromegaly present.  Cardiovascular: Normal rate, regular rhythm, S1 normal, S2 normal, normal heart sounds, intact distal pulses and normal pulses.  Exam reveals no gallop and no friction rub.   No murmur heard. Pulses:      Radial pulses are 2+ on the right side, and 2+ on the left side.       Dorsalis pedis pulses are 2+ on the right side, and 2+ on the left side.  Pulmonary/Chest: Effort normal and breath sounds normal. No respiratory distress. He has no wheezes. He has no rhonchi. He has no rales.  Abdominal: Soft. Normal appearance and bowel sounds are normal. He exhibits no distension, no ascites and no mass. There is no hepatosplenomegaly. There is no tenderness. There is no rebound, no guarding and no CVA tenderness.  Musculoskeletal: Normal range of motion. He exhibits no edema or tenderness.  Lymphadenopathy:    He has no cervical adenopathy.  Neurological: He is alert and oriented to person, place, and time. He has normal strength. No cranial nerve deficit or sensory deficit.  Skin: Skin is warm, dry and intact. No petechiae and no rash noted. He is not diaphoretic. No erythema. No pallor.  Psychiatric: He has a normal mood and affect. His behavior is normal. Judgment normal.  Nursing note and vitals reviewed.   ED Course  Procedures (including critical care time) Labs Review Labs Reviewed  CBC - Abnormal; Notable for the following:    RBC 3.98 (*)    Hemoglobin 12.4 (*)    HCT 38.7 (*)    All other components within normal limits  PROTIME-INR - Abnormal; Notable for the  following:    Prothrombin Time 15.7 (*)    All other components within normal limits    Imaging Review No results found.   EKG Interpretation None      MDM   Final diagnoses:  None    Patient does emergency primary for recurrent epistaxis. Upon my evaluation of the patient, there is no bleeding. I see no bleeding in either naris or the oropharynx. Patient was frustrated at this as he states when he goes home it will simply rebleed. I will observe the patient in the emergency department and there has been no recurrence. Repeat hemoglobin shows a higher value at 12.4 today compared to 11 yesterday. INR is 1.23 despite being on Coumadin therapy. I believe the patient is safe for discharge with ENT follow-up within 3 days. This was relayed to the patient. His vital signs were within  his normal limits and he is safe for discharge.  Everlene Balls, MD 04/07/14 [phone]

## 2014-04-09 ENCOUNTER — Emergency Department (HOSPITAL_COMMUNITY)
Admission: EM | Admit: 2014-04-09 | Discharge: 2014-04-09 | Disposition: A | Discharge status: Home / Self Care | Payer: Medicare Other | Attending: Emergency Medicine | Admitting: Emergency Medicine

## 2014-04-09 ENCOUNTER — Encounter (HOSPITAL_COMMUNITY): Payer: Self-pay | Admitting: Emergency Medicine

## 2014-04-09 DIAGNOSIS — I10 Essential (primary) hypertension: Secondary | ICD-10-CM | POA: Diagnosis not present

## 2014-04-09 DIAGNOSIS — Z8669 Personal history of other diseases of the nervous system and sense organs: Secondary | ICD-10-CM | POA: Diagnosis not present

## 2014-04-09 DIAGNOSIS — R011 Cardiac murmur, unspecified: Secondary | ICD-10-CM | POA: Diagnosis not present

## 2014-04-09 DIAGNOSIS — I509 Heart failure, unspecified: Secondary | ICD-10-CM | POA: Insufficient documentation

## 2014-04-09 DIAGNOSIS — Z7982 Long term (current) use of aspirin: Secondary | ICD-10-CM | POA: Insufficient documentation

## 2014-04-09 DIAGNOSIS — Z87891 Personal history of nicotine dependence: Secondary | ICD-10-CM | POA: Insufficient documentation

## 2014-04-09 DIAGNOSIS — Z7901 Long term (current) use of anticoagulants: Secondary | ICD-10-CM | POA: Insufficient documentation

## 2014-04-09 DIAGNOSIS — R040 Epistaxis: Secondary | ICD-10-CM | POA: Diagnosis present

## 2014-04-09 DIAGNOSIS — Z79899 Other long term (current) drug therapy: Secondary | ICD-10-CM | POA: Diagnosis not present

## 2014-04-09 LAB — CBC
HCT: 35.2 % — ABNORMAL LOW (ref 39.0–52.0)
Hemoglobin: 11.3 g/dL — ABNORMAL LOW (ref 13.0–17.0)
MCH: 30.9 pg (ref 26.0–34.0)
MCHC: 32.1 g/dL (ref 30.0–36.0)
MCV: 96.2 fL (ref 78.0–100.0)
PLATELETS: 186 10*3/uL (ref 150–400)
RBC: 3.66 MIL/uL — AB (ref 4.22–5.81)
RDW: 13.8 % (ref 11.5–15.5)
WBC: 9.4 10*3/uL (ref 4.0–10.5)

## 2014-04-09 LAB — PROTIME-INR
INR: 1.21 (ref 0.00–1.49)
Prothrombin Time: 15.5 seconds — ABNORMAL HIGH (ref 11.6–15.2)

## 2014-04-09 NOTE — Discharge Instructions (Signed)

## 2014-04-09 NOTE — ED Provider Notes (Signed)
CSN: [id number]     Arrival date & time 04/09/14  1513 History   First MD Initiated Contact with Patient 04/09/14 1526     Chief Complaint  Patient presents with  . Epistaxis     (Consider location/radiation/quality/duration/timing/severity/associated sxs/prior Treatment) HPI  74 year old male presents with intermittent epistaxis for the past 1 week. Patient was seen at [facility] on 3/25 and had Merocel gauze placed in the left side. Patient notes intermittent bleeding over the weekend. This morning he started having intermittent bleeding around 4 hours ago and someone at his facility place gauze up the right there. Endorses lightheadedness but states he always is lightheaded. Is currently on Coumadin, has had subtherapeutic INR the last couple times as been checked. Patient denies any chest pain or dyspnea. He is occasionally spitting up blood and states that now there is no more blood coming out of his nose but seems to be running down the back of his throat intermittently.  Past Medical History  Diagnosis Date  . Hypertension   . 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 Aortic valve regurgitation 01/26/14  . Cataract 08/17/2013  . CHF (congestive heart failure) 01/26/2014  . Mitral valve regurgitation 01/26/2014  . Pseudophakia 08/17/2013  . Vitamin D deficiency    Past Surgical History  Procedure Laterality Date  . Aortic valve replacement     Family History  Problem Relation Age of Onset  . Family history unknown: Yes   History  Substance Use Topics  . Smoking status: Former Research scientist (life sciences)  . Smokeless tobacco: Not on file  . Alcohol Use: No    Review of Systems  HENT: Positive for nosebleeds.   Respiratory: Negative for shortness of breath.   Cardiovascular: Negative for chest pain.  Gastrointestinal: Negative for vomiting.  Neurological: Positive for light-headedness (chronic).  All other systems reviewed and are negative.     Allergies  Review of patient's allergies  indicates no known allergies.  Home Medications   Prior to Admission medications   Medication Sig Start Date End Date Taking? Authorizing Provider  acetaminophen (TYLENOL) 325 MG tablet Take 975 mg by mouth every 8 (eight) hours.    Yes Historical Provider, MD  albuterol (PROVENTIL HFA;VENTOLIN HFA) 108 (90 BASE) MCG/ACT inhaler Inhale 2 puffs into the lungs every 6 (six) hours as needed for wheezing or shortness of breath.   Yes Historical Provider, MD  amiodarone (PACERONE) 200 MG tablet Take 200 mg by mouth daily.   Yes Historical Provider, MD  amLODipine (NORVASC) 10 MG tablet Take 10 mg by mouth daily.   Yes Historical Provider, MD  aspirin EC 81 MG tablet Take 81 mg by mouth daily.   Yes Historical Provider, MD  atorvastatin (LIPITOR) 20 MG tablet Take 20 mg by mouth at bedtime.   Yes Historical Provider, MD  furosemide (LASIX) 20 MG tablet Take 20 mg by mouth daily.   Yes Historical Provider, MD  Melatonin 3 MG TABS Take 3 mg by mouth at bedtime.   Yes Historical Provider, MD  metoprolol tartrate (LOPRESSOR) 25 MG tablet Take 37.5 mg by mouth 2 (two) times daily.    Yes Historical Provider, MD  Multiple Vitamins-Minerals (MULTIVITAMIN & MINERAL PO) Take 1 tablet by mouth daily.   Yes Historical Provider, MD  oxyCODONE (OXY IR/ROXICODONE) 5 MG immediate release tablet Take 5 mg by mouth every 4 (four) hours as needed for severe pain.   Yes Historical Provider, MD  sennosides-docusate sodium (SENOKOT-S) 8.6-50 MG tablet Take 1 tablet by mouth  2 (two) times daily as needed for constipation.    Yes Historical Provider, MD  thiamine 100 MG tablet Take 100 mg by mouth daily.   Yes Historical Provider, MD  traZODone (DESYREL) 50 MG tablet Take 50 mg by mouth at bedtime as needed for sleep.   Yes Historical Provider, MD  warfarin (COUMADIN) 5 MG tablet Take 5 mg by mouth daily.     Historical Provider, MD   BP 122/74 mmHg  Pulse 34  Temp(Src) 97 F (36.1 C)  Resp 23  Ht 6\' 5"  (1.956 m)  Wt  164 lb (74.39 kg)  BMI 19.44 kg/m2  SpO2 92% Physical Exam  Constitutional: He is oriented to person, place, and time. He appears well-developed and well-nourished. No distress.  HENT:  Head: Normocephalic and atraumatic.  Right Ear: External ear normal.  Left Ear: External ear normal.  Left nare with Merocel gauze that is red, no active bleeding out of left nare. Right nare with blood soaked gauze, when removed a large clot comes out but no bleeding seen. Right nare normal on examination. Occasionally spits blood.  Eyes: Right eye exhibits no discharge. Left eye exhibits no discharge.  Neck: Neck supple.  Cardiovascular: Normal rate, regular rhythm and intact distal pulses.   Murmur heard. Pulmonary/Chest: Effort normal.  Abdominal: Soft. He exhibits no distension. There is no tenderness.  Musculoskeletal: He exhibits no edema.  Neurological: He is alert and oriented to person, place, and time.  Skin: Skin is warm and dry. He is not diaphoretic. No pallor.  Nursing note and vitals reviewed.   ED Course  Procedures (including critical care time) Labs Review Labs Reviewed  CBC - Abnormal; Notable for the following:    RBC 3.66 (*)    Hemoglobin 11.3 (*)    HCT 35.2 (*)    All other components within normal limits  PROTIME-INR - Abnormal; Notable for the following:    Prothrombin Time 15.5 (*)    All other components within normal limits    Imaging Review No results found.   EKG Interpretation None      MDM   Final diagnoses:  Epistaxis    While in the ER the patient has not had any repeat bleeding out of his right nare. No bleeding past the packing in his left nare. He is occasionally having blood running down the back of his throat. I discussed that our options would be to take out the packing and replace it with likely an anterior/posterior pack. Patient declines this stating the first one hurt too bad. I offered pain medicines but he still declines. He wants  "definitive treatment". He has ENT follow-up tomorrow and does not have any significant blood loss. Due to this, he was to go home and will return if symptoms worsen.    Sherwood Gambler, MD 04/09/14 [phone]

## 2014-04-09 NOTE — ED Notes (Signed)
EMS - Patient coming from [facility] with nosebleed on and off x 1 week.

## 2014-04-09 NOTE — ED Notes (Signed)
[facility] called to notify them of patient's discharge, advised they would need EMS to bring him back to the facility. [facility] called for transport.

## 2014-04-09 NOTE — ED Notes (Signed)
[facility] at bedside.

## 2014-04-10 ENCOUNTER — Encounter (HOSPITAL_COMMUNITY): Payer: Self-pay | Admitting: Neurology

## 2014-04-10 ENCOUNTER — Emergency Department (HOSPITAL_COMMUNITY)
Admission: EM | Admit: 2014-04-10 | Discharge: 2014-04-10 | Disposition: A | Discharge status: Home / Self Care | Payer: Medicare Other | Attending: Emergency Medicine | Admitting: Emergency Medicine

## 2014-04-10 DIAGNOSIS — R040 Epistaxis: Secondary | ICD-10-CM | POA: Insufficient documentation

## 2014-04-10 DIAGNOSIS — I671 Cerebral aneurysm, nonruptured: Secondary | ICD-10-CM | POA: Diagnosis not present

## 2014-04-10 DIAGNOSIS — R011 Cardiac murmur, unspecified: Secondary | ICD-10-CM | POA: Diagnosis not present

## 2014-04-10 DIAGNOSIS — Z87891 Personal history of nicotine dependence: Secondary | ICD-10-CM | POA: Insufficient documentation

## 2014-04-10 DIAGNOSIS — I10 Essential (primary) hypertension: Secondary | ICD-10-CM | POA: Insufficient documentation

## 2014-04-10 DIAGNOSIS — I509 Heart failure, unspecified: Secondary | ICD-10-CM | POA: Diagnosis not present

## 2014-04-10 DIAGNOSIS — Z79899 Other long term (current) drug therapy: Secondary | ICD-10-CM | POA: Insufficient documentation

## 2014-04-10 DIAGNOSIS — Z7982 Long term (current) use of aspirin: Secondary | ICD-10-CM | POA: Diagnosis not present

## 2014-04-10 DIAGNOSIS — Z8669 Personal history of other diseases of the nervous system and sense organs: Secondary | ICD-10-CM | POA: Insufficient documentation

## 2014-04-10 DIAGNOSIS — Z8639 Personal history of other endocrine, nutritional and metabolic disease: Secondary | ICD-10-CM | POA: Insufficient documentation

## 2014-04-10 LAB — BASIC METABOLIC PANEL
ANION GAP: 9 (ref 5–15)
BUN: 27 mg/dL — AB (ref 6–23)
CO2: 25 mmol/L (ref 19–32)
Calcium: 9.3 mg/dL (ref 8.4–10.5)
Chloride: 106 mmol/L (ref 96–112)
Creatinine, Ser: 1.2 mg/dL (ref 0.50–1.35)
GFR calc non Af Amer: 58 mL/min — ABNORMAL LOW (ref >90)
GFR, EST AFRICAN AMERICAN: 67 mL/min — AB (ref >90)
GLUCOSE: 96 mg/dL (ref 70–99)
Potassium: 4.7 mmol/L (ref 3.5–5.1)
Sodium: 140 mmol/L (ref 135–145)

## 2014-04-10 LAB — PROTIME-INR
INR: 1.21 (ref 0.00–1.49)
PROTHROMBIN TIME: 15.4 s — AB (ref 11.6–15.2)

## 2014-04-10 LAB — CBC WITH DIFFERENTIAL/PLATELET
BASOS ABS: 0.1 10*3/uL (ref 0.0–0.1)
BASOS PCT: 1 % (ref 0–1)
Eosinophils Absolute: 0.4 10*3/uL (ref 0.0–0.7)
Eosinophils Relative: 5 % (ref 0–5)
HEMATOCRIT: 36.8 % — AB (ref 39.0–52.0)
Hemoglobin: 11.7 g/dL — ABNORMAL LOW (ref 13.0–17.0)
LYMPHS ABS: 2.1 10*3/uL (ref 0.7–4.0)
Lymphocytes Relative: 24 % (ref 12–46)
MCH: 30.6 pg (ref 26.0–34.0)
MCHC: 31.8 g/dL (ref 30.0–36.0)
MCV: 96.3 fL (ref 78.0–100.0)
MONO ABS: 0.6 10*3/uL (ref 0.1–1.0)
Monocytes Relative: 6 % (ref 3–12)
NEUTROS ABS: 5.6 10*3/uL (ref 1.7–7.7)
NEUTROS PCT: 64 % (ref 43–77)
PLATELETS: 195 10*3/uL (ref 150–400)
RBC: 3.82 MIL/uL — ABNORMAL LOW (ref 4.22–5.81)
RDW: 13.9 % (ref 11.5–15.5)
WBC: 8.7 10*3/uL (ref 4.0–10.5)

## 2014-04-10 NOTE — Discharge Instructions (Signed)

## 2014-04-10 NOTE — ED Notes (Signed)
Dr. Audie Pinto on the phone with Dr. Wilburn Cornelia, ENT.

## 2014-04-10 NOTE — ED Notes (Signed)
Dr. Wilburn Cornelia at bedside working with patient.

## 2014-04-10 NOTE — ED Notes (Signed)
Dr. Wilburn Cornelia has cauterized Cody Lane's nostril. No bleeding at current. Cody Lane is resting in bed, talking with wife.

## 2014-04-10 NOTE — ED Notes (Signed)
Pt comes from [facility], with nose bleed for several days. Had appt with Dr. Wilburn Cornelia at 1:30 but nose bled all through the night. Has cotton absorbent pad in both nostrils.

## 2014-04-10 NOTE — ED Provider Notes (Signed)
CSN: [id number]     Arrival date & time 04/10/14  1010 History   First MD Initiated Contact with Patient 04/10/14 1010     Chief Complaint  Patient presents with  . Epistaxis     HPI Pt comes from [facility], with nose bleed for several days. Had appt with Dr. Wilburn Cornelia at 1:30 but nose bled all through the night. Has cotton absorbent pad in both nostrils Past Medical History  Diagnosis Date  . Hypertension   . 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 Aortic valve regurgitation 01/26/14  . Cataract 08/17/2013  . CHF (congestive heart failure) 01/26/2014  . Mitral valve regurgitation 01/26/2014  . Pseudophakia 08/17/2013  . Vitamin D deficiency    Past Surgical History  Procedure Laterality Date  . Aortic valve replacement     Family History  Problem Relation Age of Onset  . Family history unknown: Yes   History  Substance Use Topics  . Smoking status: Former Research scientist (life sciences)  . Smokeless tobacco: Not on file  . Alcohol Use: No    Review of Systems  All other systems reviewed and are negative.     Allergies  Review of patient's allergies indicates no known allergies.  Home Medications   Prior to Admission medications   Medication Sig Start Date End Date Taking? Authorizing Provider  acetaminophen (TYLENOL) 325 MG tablet Take 975 mg by mouth every 8 (eight) hours.    Yes Historical Provider, MD  albuterol (PROVENTIL HFA;VENTOLIN HFA) 108 (90 BASE) MCG/ACT inhaler Inhale 2 puffs into the lungs every 6 (six) hours as needed for wheezing or shortness of breath.   Yes Historical Provider, MD  amiodarone (PACERONE) 200 MG tablet Take 200 mg by mouth daily.   Yes Historical Provider, MD  amLODipine (NORVASC) 10 MG tablet Take 10 mg by mouth daily.   Yes Historical Provider, MD  aspirin EC 81 MG tablet Take 81 mg by mouth daily.   Yes Historical Provider, MD  atorvastatin (LIPITOR) 20 MG tablet Take 20 mg by mouth at bedtime.   Yes Historical Provider, MD  furosemide (LASIX) 20 MG tablet  Take 20 mg by mouth daily.   Yes Historical Provider, MD  Melatonin 3 MG TABS Take 3 mg by mouth at bedtime.   Yes Historical Provider, MD  metoprolol tartrate (LOPRESSOR) 25 MG tablet Take 37.5 mg by mouth 2 (two) times daily.    Yes Historical Provider, MD  Multiple Vitamins-Minerals (MULTIVITAMIN & MINERAL PO) Take 1 tablet by mouth daily.   Yes Historical Provider, MD  oxyCODONE (OXY IR/ROXICODONE) 5 MG immediate release tablet Take 5 mg by mouth every 4 (four) hours as needed for severe pain.   Yes Historical Provider, MD  sennosides-docusate sodium (SENOKOT-S) 8.6-50 MG tablet Take 1 tablet by mouth 2 (two) times daily as needed for constipation.    Yes Historical Provider, MD  thiamine 100 MG tablet Take 100 mg by mouth daily.   Yes Historical Provider, MD  traZODone (DESYREL) 50 MG tablet Take 50 mg by mouth at bedtime as needed for sleep.   Yes Historical Provider, MD   BP 118/95 mmHg  Pulse 85  Temp(Src) 97.7 F (36.5 C) (Oral)  Resp 17  SpO2 94% Physical Exam  Constitutional: He is oriented to person, place, and time. He appears well-developed and well-nourished. No distress.  HENT:  Head: Normocephalic and atraumatic.  Right Ear: External ear normal.  Left Ear: External ear normal.  Left nare with Merocel gauze that is red, no  active bleeding out of left nare. Right nare with blood soaked gauze, when removed a large clot comes out but no bleeding seen. Right nare normal on examination. Occasionally spits blood.  Eyes: Right eye exhibits no discharge. Left eye exhibits no discharge.  Neck: Neck supple.  Cardiovascular: Normal rate, regular rhythm and intact distal pulses.   Murmur heard. Pulmonary/Chest: Effort normal.  Abdominal: Soft. He exhibits no distension. There is no tenderness.  Musculoskeletal: He exhibits no edema.  Neurological: He is alert and oriented to person, place, and time.  Skin: Skin is warm and dry. He is not diaphoretic. No pallor.  Nursing note and  vitals reviewed.   ED Course  Procedures (including critical care time) Labs Review Labs Reviewed  BASIC METABOLIC PANEL - Abnormal; Notable for the following:    BUN 27 (*)    GFR calc non Af Amer 58 (*)    GFR calc Af Amer 67 (*)    All other components within normal limits  CBC WITH DIFFERENTIAL/PLATELET - Abnormal; Notable for the following:    RBC 3.82 (*)    Hemoglobin 11.7 (*)    HCT 36.8 (*)    All other components within normal limits  PROTIME-INR - Abnormal; Notable for the following:    Prothrombin Time 15.4 (*)    All other components within normal limits    Imaging Review No results found.   EKG Interpretation None     Dr. Wilburn Cornelia from ear nose and throat contacted.  ENT nosebleed cart will be placed at bedside.  He will see patient in emergency department.  Cardiology was consulted concerning restarting his Coumadin.  Patient is in sinus rhythm.  He apparently has a tissue heart valve and according cardiology most likely will not need further anticoagulation.  We contacted his nursing home who have been in contact with his primary care doctors at the [place] and they confirmed that he does not need to be back on anticoagulation.  He was discharged in good condition. MDM   Final diagnoses:  Epistaxis        Leonard Schwartz, MD 04/10/14 1521

## 2014-04-10 NOTE — Consult Note (Signed)
ENT CONSULT:  Reason for Consult: Recurrent epistaxis Referring Physician: EDP  Cody Lane is an 74 y.o. male.  HPI: The patient presents to [facility] emergency department with acute epistaxis. The patient has a very complex past medical history which includes previous aortic valve replacement surgery 1 month ago, performed at the [facility], and chronic anticoagulant therapy. The patient is in a nursing care facility in [place] for postoperative rehabilitation. Over the last 5 days he has had recurrent severe epistaxis, he has been seen in the emergency room on a daily basis with recurrent bleeding. His anticoagulant therapy, Coumadin, was discontinued 4 days ago because of severe bleeding. The patient underwent anterior nasal cautery and nasal packing but continues to have significant recurrent bleeding. No other bleeding abnormalities, bruising, hematuria or other concern.  Past Medical History  Diagnosis Date  . Hypertension   . 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 Aortic valve regurgitation 01/26/14  . Cataract 08/17/2013  . CHF (congestive heart failure) 01/26/2014  . Mitral valve regurgitation 01/26/2014  . Pseudophakia 08/17/2013  . Vitamin D deficiency     Past Surgical History  Procedure Laterality Date  . Aortic valve replacement      Family History  Problem Relation Age of Onset  . Family history unknown: Yes    Social History:  reports that he has quit smoking. He does not have any smokeless tobacco history on file. He reports that he does not drink alcohol or use illicit drugs.  Allergies: No Known Allergies  Medications: I have reviewed the patient's current medications.  Results for orders placed or performed during the hospital encounter of 04/10/14 (from the past 48 hour(s))  Basic metabolic panel     Status: Abnormal   Collection Time: 04/10/14 10:30 AM  Result Value Ref Range   Sodium 140 135 - 145 mmol/L   Potassium 4.7 3.5 - 5.1 mmol/L   Chloride 106 96 - 112 mmol/L   CO2 25 19 - 32 mmol/L   Glucose, Bld 96 70 - 99 mg/dL   BUN 27 (H) 6 - 23 mg/dL   Creatinine, Ser 1.20 0.50 - 1.35 mg/dL   Calcium 9.3 8.4 - 10.5 mg/dL   GFR calc non Af Amer 58 (L) >90 mL/min   GFR calc Af Amer 67 (L) >90 mL/min    Comment: (NOTE) The eGFR has been calculated using the CKD EPI equation. This calculation has not been validated in all clinical situations. eGFR's persistently <90 mL/min signify possible Chronic Kidney Disease.    Anion gap 9 5 - 15  CBC with Differential/Platelet     Status: Abnormal   Collection Time: 04/10/14 10:30 AM  Result Value Ref Range   WBC 8.7 4.0 - 10.5 K/uL   RBC 3.82 (L) 4.22 - 5.81 MIL/uL   Hemoglobin 11.7 (L) 13.0 - 17.0 g/dL   HCT 36.8 (L) 39.0 - 52.0 %   MCV 96.3 78.0 - 100.0 fL   MCH 30.6 26.0 - 34.0 pg   MCHC 31.8 30.0 - 36.0 g/dL   RDW 13.9 11.5 - 15.5 %   Platelets 195 150 - 400 K/uL   Neutrophils Relative % 64 43 - 77 %   Neutro Abs 5.6 1.7 - 7.7 K/uL   Lymphocytes Relative 24 12 - 46 %   Lymphs Abs 2.1 0.7 - 4.0 K/uL   Monocytes Relative 6 3 - 12 %   Monocytes Absolute 0.6 0.1 - 1.0 K/uL   Eosinophils Relative 5 0 -  5 %   Eosinophils Absolute 0.4 0.0 - 0.7 K/uL   Basophils Relative 1 0 - 1 %   Basophils Absolute 0.1 0.0 - 0.1 K/uL  Protime-INR     Status: Abnormal   Collection Time: 04/10/14 10:30 AM  Result Value Ref Range   Prothrombin Time 15.4 (H) 11.6 - 15.2 seconds   INR 1.21 0.00 - 1.49    No results found.  ROS:ROS 12 systems reviewed and negative except as stated in HPI   Blood pressure 135/96, pulse 78, temperature 97.7 F (36.5 C), temperature source Oral, resp. rate 16, SpO2 96 %.  PHYSICAL EXAM: General appearance - alert, well appearing, and in mild distress Mental status - alert, oriented to person, place, and time Nose - normal and patent, no erythema, discharge or polyps and bilateral anterior nasal packing with active anterior posterior epistaxis. Mouth -  mucous membranes moist, pharynx normal without lesions and Active oropharyngeal bleeding with clots and coughing.  Studies Reviewed: Current blood work with stable hemoglobin and hematocrit, platelets and INR of 1.2  PROCEDURE: Endoscopic nasal cautery Packing removed from the patient's nasal cavity bilaterally and endoscopic nasal examination undertaken with flexible endoscope. There is a moderate amount of clotted material within the nasal passageway which was suctioned with active bleeding from the inferior mid aspect of the left floor of nose adjacent to the nasal septum.  Topical Xylocaine and Neo-Synephrine packing placed bilaterally for hemostasis and anesthesia.  0 nasal endoscope used to examine the patient bilaterally, old clotted blood suctioned from the middle meatus bilaterally, no active bleeding. The patient's primary bleeding site appears to be the left mid nasal passageway along the floor of the nose adjacent to the nasal septum. Using monopolar suction cautery at [address] the area of active bleeding was thoroughly cauterized.  No further bleeding, patient tolerated the procedure without difficulty or complication.   Assessment/Plan: The patient presents with a complex series of issues, he has undergone recent valve replacement surgery and has had severe epistaxis over the last several days, his anticoagulant therapy has been discontinued. The patient's active bleeding issue appears to be under good control after endoscopic cautery, no packing was placed. Recommend epistaxis precautions including frequent nasal saline spray, no nose blowing or nasal trauma and topical saline gel. Concerns discussed with Dr. Audie Lane, the ER physician, the patient will require close cardiac monitoring and restart of his anticoagulant therapy with significant risk for recurrent epistaxis. His cardiologist and cardiothoracic surgeon is at the [facility] in [place], he does not have any medical physicians in  [place]. It may be prudent to consider transferring the patient to the [place] for restart of his anticoagulant therapy and close monitoring for recurrent concerns.  [place], Cody Lane 04/10/2014, 1:22 PM

## 2014-04-10 NOTE — ED Notes (Signed)
Spoke with Kathlee Nations, at [facility]. Reports Dr. Wilburn Cornelia is in surgery need to make him aware pt is here so he can some and cauterize bleeding. Dr. Audie Pinto is aware.

## 2014-04-10 NOTE — ED Notes (Signed)
Spoke with Kathlee Nations, RN at [facility]. She has already contacted [facility] cardiology who reports to discontinue the coumadin. Facility made aware of plan. Pt to be discharged back to [facility] via his wife.

## 2014-04-12 ENCOUNTER — Encounter: Payer: Self-pay | Admitting: Adult Health

## 2014-04-12 ENCOUNTER — Non-Acute Institutional Stay (SKILLED_NURSING_FACILITY): Payer: Medicare Other | Admitting: Adult Health

## 2014-04-12 DIAGNOSIS — I351 Nonrheumatic aortic (valve) insufficiency: Secondary | ICD-10-CM

## 2014-04-12 DIAGNOSIS — R040 Epistaxis: Secondary | ICD-10-CM | POA: Diagnosis not present

## 2014-04-12 DIAGNOSIS — G4700 Insomnia, unspecified: Secondary | ICD-10-CM | POA: Diagnosis not present

## 2014-04-12 DIAGNOSIS — E785 Hyperlipidemia, unspecified: Secondary | ICD-10-CM | POA: Diagnosis not present

## 2014-04-12 DIAGNOSIS — N2889 Other specified disorders of kidney and ureter: Secondary | ICD-10-CM | POA: Diagnosis not present

## 2014-04-12 DIAGNOSIS — I10 Essential (primary) hypertension: Secondary | ICD-10-CM | POA: Diagnosis not present

## 2014-04-12 DIAGNOSIS — R5381 Other malaise: Secondary | ICD-10-CM

## 2014-04-12 DIAGNOSIS — I509 Heart failure, unspecified: Secondary | ICD-10-CM

## 2014-04-12 DIAGNOSIS — I4891 Unspecified atrial fibrillation: Secondary | ICD-10-CM

## 2014-04-12 NOTE — Progress Notes (Signed)
Patient ID: Cody Lane, male   DOB: 11-14-1940, 74 y.o.   MRN: [id number]   04/12/2014  Facility:  Nursing Home Location:  [place] Room Number: 1204-P LEVEL OF CARE:  SNF (31)   Chief Complaint  Patient presents with  . Discharge Note    Aortic valve insufficiency S/P aortic valve replacement, kidney mass, hypertension , atrial fibrillation, hyperlipidemia, Epistaxis, CHF and insomnia    HISTORY OF PRESENT ILLNESS:  This is a 74 year old male who is for discharge home with home health PT for balance, ambulation and strengthening and OT for self care skills.  He has been admitted to [facility] on 03/23/14 from [place]. He was brought to the hospital by a friend due to falls twice a day and was having agitated behavior. He was noted to be easily winded with just few steps. He was noncompliant with meds and EF of 45%. He had aortic valve insufficiency and had aortic valve replacement done on 03/08/14. He has past medical history of hypertension, hyperlipidemia and insomnia.   He had epistaxis and has to be sent to the ER 4X and had cauterization on the 4th trip to ER. Coumadin has been discontinued. Epistaxis has now been resolved.  Patient was admitted to this facility for short-term rehabilitation after the patient's recent hospitalization.  Patient has completed SNF rehabilitation and therapy has cleared the patient for discharge.  PAST MEDICAL HISTORY:  Past Medical History  Diagnosis Date  . Hypertension   . 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 Aortic valve regurgitation 01/26/14  . Cataract 08/17/2013  . CHF (congestive heart failure) 01/26/2014  . Mitral valve regurgitation 01/26/2014  . Pseudophakia 08/17/2013  . Vitamin D deficiency     CURRENT MEDICATIONS: Reviewed per MAR/see medication list  No Known Allergies   REVIEW OF SYSTEMS:  GENERAL: no change in appetite, no fatigue, no weight changes, no fever, chills or weakness RESPIRATORY:  no cough, SOB, DOE, wheezing, hemoptysis CARDIAC: no chest pain, edema or palpitations GI: no abdominal pain, diarrhea, constipation, heart burn, nausea or vomiting  PHYSICAL EXAMINATION  GENERAL: no acute distress, normal body habitus SKIN:  Chest midline incision , bilateral abdominal upper quadrants incisions are dry ,healed NECK: supple, trachea midline, no neck masses, no thyroid tenderness, no thyromegaly LYMPHATICS: no LAN in the neck, no supraclavicular LAN RESPIRATORY: breathing is even & unlabored, BS CTAB CARDIAC: RRR, no murmur,no extra heart sounds, no edema GI: abdomen soft, normal BS, no masses, no tenderness, no hepatomegaly, no splenomegaly EXTREMITIES: Able to move 4 extremities PSYCHIATRIC: the patient is alert & oriented to person, affect & behavior appropriate  LABS/RADIOLOGY: Labs reviewed: Basic Metabolic Panel:  Recent Labs  04/10/14 1030  NA 140  K 4.7  CL 106  CO2 25  GLUCOSE 96  BUN 27*  CREATININE 1.20  CALCIUM 9.3   CBC:  Recent Labs  04/06/14 0655 04/06/14 2359 04/09/14 1546 04/10/14 1030  WBC 8.7 8.7 9.4 8.7  NEUTROABS 5.2  --   --  5.6  HGB 11.9* 12.4* 11.3* 11.7*  HCT 37.4* 38.7* 35.2* 36.8*  MCV 96.9 97.2 96.2 96.3  PLT 243 239 186 195    02/28/14  creatinine 2.0 glucose 117 BUN 54 sodium 139 potassium 4.3 calcium 8.9 SGOT 112 SGPT 120 magnesium 2.4 WBC 4.5 hemoglobin 14.2 hematocrit 42.8 MCV 95 Chest x-ray shows emphysema; no consolidative opacities nor pleural effusions CT head shows no acute intracranial hemorrhage nor mass effect  ASSESSMENT/PLAN:  Physical deconditioning - for home health PT, OT and nursing  Aortic valve insufficiency S/P Aortic Valve Replacement - follow-up with cardiology 05/08/14; continue oxycodone 5 mg 1 tab by mouth every 4 hours when necessary Atrial fibrillation - rate controlled; continue amiodarone 200 mg by mouth daily, metoprolol 37.5 mg by mouth every 12 hours and ASA 81 mg PO Q D Kidney mass  - incidental finding; will need CT when stable Hypertension - well controlled; continue amlodipine 10 mg by mouth daily and metoprolol 37.5 mg by mouth every 12 hours CHF - continue Lasix 20 mg by mouth daily Hyperlipidemia - continue atorvastatin 20 mg by mouth daily at bedtime Insomnia - continue melatonin 3 mg by mouth every morning and trazodone 50 mg by mouth daily at bedtime when necessary Epistaxis - S/P cauterization and coumadin was discontinued   I have filled out patient's discharge paperwork and written prescriptions.  Patient will receive home health PT, OT and Nursing.  Total discharge time: Greater than 30 minutes  Discharge time involved coordination of the discharge process with social worker, nursing staff and therapy department. Medical justification for home health services verified.   [facility], NP [employer] [phone]

## 2017-10-12 DIAGNOSIS — I639 Cerebral infarction, unspecified: Secondary | ICD-10-CM

## 2017-10-12 HISTORY — DX: Cerebral infarction, unspecified: I63.9

## 2017-10-15 ENCOUNTER — Inpatient Hospital Stay (HOSPITAL_COMMUNITY)
Admission: EM | Admit: 2017-10-15 | Discharge: 2017-10-18 | DRG: 065 | Disposition: A | Discharge status: Home / Self Care | Payer: Medicare Other | Attending: Internal Medicine | Admitting: Internal Medicine

## 2017-10-15 ENCOUNTER — Observation Stay (HOSPITAL_COMMUNITY): Payer: Medicare Other

## 2017-10-15 ENCOUNTER — Encounter (HOSPITAL_COMMUNITY): Payer: Self-pay | Admitting: Emergency Medicine

## 2017-10-15 ENCOUNTER — Emergency Department (HOSPITAL_COMMUNITY): Payer: Medicare Other

## 2017-10-15 DIAGNOSIS — I679 Cerebrovascular disease, unspecified: Secondary | ICD-10-CM | POA: Diagnosis present

## 2017-10-15 DIAGNOSIS — R402253 Coma scale, best verbal response, oriented, at hospital admission: Secondary | ICD-10-CM | POA: Diagnosis present

## 2017-10-15 DIAGNOSIS — I482 Chronic atrial fibrillation, unspecified: Secondary | ICD-10-CM | POA: Diagnosis present

## 2017-10-15 DIAGNOSIS — R402363 Coma scale, best motor response, obeys commands, at hospital admission: Secondary | ICD-10-CM | POA: Diagnosis not present

## 2017-10-15 DIAGNOSIS — Z87891 Personal history of nicotine dependence: Secondary | ICD-10-CM | POA: Diagnosis not present

## 2017-10-15 DIAGNOSIS — I5022 Chronic systolic (congestive) heart failure: Secondary | ICD-10-CM | POA: Diagnosis not present

## 2017-10-15 DIAGNOSIS — I080 Rheumatic disorders of both mitral and aortic valves: Secondary | ICD-10-CM | POA: Diagnosis not present

## 2017-10-15 DIAGNOSIS — E785 Hyperlipidemia, unspecified: Secondary | ICD-10-CM | POA: Diagnosis present

## 2017-10-15 DIAGNOSIS — I110 Hypertensive heart disease with heart failure: Secondary | ICD-10-CM | POA: Diagnosis not present

## 2017-10-15 DIAGNOSIS — H9202 Otalgia, left ear: Secondary | ICD-10-CM | POA: Diagnosis not present

## 2017-10-15 DIAGNOSIS — R402143 Coma scale, eyes open, spontaneous, at hospital admission: Secondary | ICD-10-CM | POA: Diagnosis not present

## 2017-10-15 DIAGNOSIS — H532 Diplopia: Secondary | ICD-10-CM | POA: Diagnosis not present

## 2017-10-15 DIAGNOSIS — R590 Localized enlarged lymph nodes: Secondary | ICD-10-CM | POA: Diagnosis present

## 2017-10-15 DIAGNOSIS — N400 Enlarged prostate without lower urinary tract symptoms: Secondary | ICD-10-CM | POA: Diagnosis present

## 2017-10-15 DIAGNOSIS — R05 Cough: Secondary | ICD-10-CM | POA: Diagnosis present

## 2017-10-15 DIAGNOSIS — R918 Other nonspecific abnormal finding of lung field: Secondary | ICD-10-CM | POA: Diagnosis present

## 2017-10-15 DIAGNOSIS — H4902 Third [oculomotor] nerve palsy, left eye: Secondary | ICD-10-CM | POA: Diagnosis not present

## 2017-10-15 DIAGNOSIS — R42 Dizziness and giddiness: Secondary | ICD-10-CM

## 2017-10-15 DIAGNOSIS — I6381 Other cerebral infarction due to occlusion or stenosis of small artery: Principal | ICD-10-CM | POA: Diagnosis present

## 2017-10-15 DIAGNOSIS — I351 Nonrheumatic aortic (valve) insufficiency: Secondary | ICD-10-CM | POA: Diagnosis present

## 2017-10-15 DIAGNOSIS — Z823 Family history of stroke: Secondary | ICD-10-CM

## 2017-10-15 DIAGNOSIS — Z953 Presence of xenogenic heart valve: Secondary | ICD-10-CM

## 2017-10-15 DIAGNOSIS — I4821 Permanent atrial fibrillation: Secondary | ICD-10-CM

## 2017-10-15 DIAGNOSIS — I639 Cerebral infarction, unspecified: Secondary | ICD-10-CM

## 2017-10-15 DIAGNOSIS — R29700 NIHSS score 0: Secondary | ICD-10-CM | POA: Diagnosis present

## 2017-10-15 DIAGNOSIS — C7800 Secondary malignant neoplasm of unspecified lung: Secondary | ICD-10-CM | POA: Diagnosis present

## 2017-10-15 DIAGNOSIS — Z8679 Personal history of other diseases of the circulatory system: Secondary | ICD-10-CM

## 2017-10-15 DIAGNOSIS — I4581 Long QT syndrome: Secondary | ICD-10-CM | POA: Diagnosis not present

## 2017-10-15 DIAGNOSIS — R591 Generalized enlarged lymph nodes: Secondary | ICD-10-CM

## 2017-10-15 DIAGNOSIS — I10 Essential (primary) hypertension: Secondary | ICD-10-CM | POA: Diagnosis present

## 2017-10-15 DIAGNOSIS — Z79899 Other long term (current) drug therapy: Secondary | ICD-10-CM | POA: Diagnosis not present

## 2017-10-15 DIAGNOSIS — R059 Cough, unspecified: Secondary | ICD-10-CM | POA: Insufficient documentation

## 2017-10-15 DIAGNOSIS — I509 Heart failure, unspecified: Secondary | ICD-10-CM

## 2017-10-15 LAB — URINALYSIS, ROUTINE W REFLEX MICROSCOPIC
Bilirubin Urine: NEGATIVE
GLUCOSE, UA: NEGATIVE mg/dL
HGB URINE DIPSTICK: NEGATIVE
Ketones, ur: NEGATIVE mg/dL
Leukocytes, UA: NEGATIVE
Nitrite: NEGATIVE
PROTEIN: NEGATIVE mg/dL
SPECIFIC GRAVITY, URINE: 1.032 — AB (ref 1.005–1.030)
pH: 7 (ref 5.0–8.0)

## 2017-10-15 LAB — BASIC METABOLIC PANEL
ANION GAP: 11 (ref 5–15)
BUN: 16 mg/dL (ref 8–23)
CHLORIDE: 106 mmol/L (ref 98–111)
CO2: 26 mmol/L (ref 22–32)
CREATININE: 1.24 mg/dL (ref 0.61–1.24)
Calcium: 9.2 mg/dL (ref 8.9–10.3)
GFR calc Af Amer: >60 mL/min (ref >60)
GFR calc non Af Amer: 54 mL/min — ABNORMAL LOW (ref >60)
Glucose, Bld: 94 mg/dL (ref 70–99)
POTASSIUM: 4.2 mmol/L (ref 3.5–5.1)
Sodium: 143 mmol/L (ref 135–145)

## 2017-10-15 LAB — CBC
HCT: 44 % (ref 39.0–52.0)
Hemoglobin: 14.3 g/dL (ref 13.0–17.0)
MCH: 30.7 pg (ref 26.0–34.0)
MCHC: 32.5 g/dL (ref 30.0–36.0)
MCV: 94.4 fL (ref 78.0–100.0)
PLATELETS: 279 10*3/uL (ref 150–400)
RBC: 4.66 MIL/uL (ref 4.22–5.81)
RDW: 12.1 % (ref 11.5–15.5)
WBC: 8.7 10*3/uL (ref 4.0–10.5)

## 2017-10-15 LAB — CBG MONITORING, ED: Glucose-Capillary: 84 mg/dL (ref 70–99)

## 2017-10-15 MED ORDER — SODIUM CHLORIDE 0.9 % IJ SOLN
INTRAMUSCULAR | Status: AC
  Administered 2017-10-15: 18:00:00
  Filled 2017-10-15: qty 50

## 2017-10-15 MED ORDER — AMLODIPINE BESYLATE 5 MG PO TABS
10.0000 mg | ORAL_TABLET | Freq: Every day | ORAL | Status: DC
Start: 2017-10-16 — End: 2017-10-18
  Administered 2017-10-16 – 2017-10-18 (×3): 10 mg via ORAL
  Filled 2017-10-15 (×3): qty 2

## 2017-10-15 MED ORDER — FLUTICASONE PROPIONATE 50 MCG/ACT NA SUSP
1.0000 | Freq: Every day | NASAL | Status: DC | PRN
  Filled 2017-10-15: qty 16

## 2017-10-15 MED ORDER — IOPAMIDOL (ISOVUE-370) INJECTION 76%
INTRAVENOUS | Status: AC
  Administered 2017-10-15: 18:00:00
  Filled 2017-10-15: qty 100

## 2017-10-15 MED ORDER — ACETAMINOPHEN 650 MG RE SUPP: 650.0000 mg | RECTAL | Status: DC | PRN

## 2017-10-15 MED ORDER — ENOXAPARIN SODIUM 40 MG/0.4ML ~~LOC~~ SOLN
40.0000 mg | Freq: Every day | SUBCUTANEOUS | Status: DC
  Administered 2017-10-16: 40 mg via SUBCUTANEOUS
  Filled 2017-10-15: qty 0.4

## 2017-10-15 MED ORDER — TAMSULOSIN HCL 0.4 MG PO CAPS
0.4000 mg | ORAL_CAPSULE | Freq: Two times a day (BID) | ORAL | Status: DC
  Administered 2017-10-16 – 2017-10-18 (×5): 0.4 mg via ORAL
  Filled 2017-10-15 (×5): qty 1

## 2017-10-15 MED ORDER — ASPIRIN 325 MG PO TABS
325.0000 mg | ORAL_TABLET | Freq: Every day | ORAL | Status: DC
  Administered 2017-10-16 (×2): 325 mg via ORAL
  Filled 2017-10-15 (×2): qty 1

## 2017-10-15 MED ORDER — ADULT MULTIVITAMIN W/MINERALS CH
1.0000 | ORAL_TABLET | Freq: Every day | ORAL | Status: DC
  Administered 2017-10-16 – 2017-10-18 (×3): 1 via ORAL
  Filled 2017-10-15 (×4): qty 1

## 2017-10-15 MED ORDER — ALBUTEROL SULFATE (2.5 MG/3ML) 0.083% IN NEBU: 3.0000 mL | INHALATION_SOLUTION | Freq: Four times a day (QID) | RESPIRATORY_TRACT | Status: DC | PRN

## 2017-10-15 MED ORDER — IOPAMIDOL (ISOVUE-370) INJECTION 76%
100.0000 mL | Freq: Once | INTRAVENOUS | Status: AC | PRN
  Administered 2017-10-15: 100 mL via INTRAVENOUS

## 2017-10-15 MED ORDER — ACETAMINOPHEN 160 MG/5ML PO SOLN: 650.0000 mg | ORAL | Status: DC | PRN

## 2017-10-15 MED ORDER — GABAPENTIN 400 MG PO CAPS
400.0000 mg | ORAL_CAPSULE | Freq: Three times a day (TID) | ORAL | Status: DC
  Administered 2017-10-16 – 2017-10-18 (×8): 400 mg via ORAL
  Filled 2017-10-15 (×8): qty 1

## 2017-10-15 MED ORDER — LORAZEPAM 2 MG/ML IJ SOLN: 1.0000 mg | Freq: Four times a day (QID) | INTRAMUSCULAR | Status: DC | PRN

## 2017-10-15 MED ORDER — THIAMINE HCL 100 MG/ML IJ SOLN: 100.0000 mg | Freq: Every day | INTRAMUSCULAR | Status: DC

## 2017-10-15 MED ORDER — FOLIC ACID 1 MG PO TABS
1.0000 mg | ORAL_TABLET | Freq: Every day | ORAL | Status: DC
  Administered 2017-10-16 – 2017-10-18 (×3): 1 mg via ORAL
  Filled 2017-10-15 (×3): qty 1

## 2017-10-15 MED ORDER — VITAMIN B-1 100 MG PO TABS
100.0000 mg | ORAL_TABLET | Freq: Every day | ORAL | Status: DC
  Administered 2017-10-16 – 2017-10-18 (×3): 100 mg via ORAL
  Filled 2017-10-15 (×3): qty 1

## 2017-10-15 MED ORDER — CEFDINIR 300 MG PO CAPS
300.0000 mg | ORAL_CAPSULE | Freq: Two times a day (BID) | ORAL | Status: DC
  Administered 2017-10-16 – 2017-10-18 (×6): 300 mg via ORAL
  Filled 2017-10-15 (×7): qty 1

## 2017-10-15 MED ORDER — ASPIRIN 300 MG RE SUPP: 300.0000 mg | Freq: Every day | RECTAL | Status: DC

## 2017-10-15 MED ORDER — ACETAMINOPHEN 325 MG PO TABS: 650.0000 mg | ORAL_TABLET | ORAL | Status: DC | PRN

## 2017-10-15 MED ORDER — ATORVASTATIN CALCIUM 20 MG PO TABS
20.0000 mg | ORAL_TABLET | Freq: Every day | ORAL | Status: DC
  Administered 2017-10-16 – 2017-10-17 (×3): 20 mg via ORAL
  Filled 2017-10-15 (×3): qty 1
  Filled 2017-10-15 (×2): qty 2

## 2017-10-15 MED ORDER — METOPROLOL TARTRATE 25 MG PO TABS
37.5000 mg | ORAL_TABLET | Freq: Two times a day (BID) | ORAL | Status: DC
Start: 2017-10-16 — End: 2017-10-18
  Administered 2017-10-16 – 2017-10-18 (×6): 37.5 mg via ORAL
  Filled 2017-10-15 (×6): qty 2

## 2017-10-15 MED ORDER — LORAZEPAM 1 MG PO TABS: 1.0000 mg | ORAL_TABLET | Freq: Four times a day (QID) | ORAL | Status: DC | PRN

## 2017-10-15 NOTE — ED Notes (Signed)
ED Provider at bedside. 

## 2017-10-15 NOTE — ED Notes (Signed)
Attempted to call report.  Secretary states she thinks the room is going to change.  Will call back shortly.

## 2017-10-15 NOTE — ED Notes (Signed)
Teleneuro called. Cart 1 in room

## 2017-10-15 NOTE — ED Notes (Signed)
Hospitalist at bedside 

## 2017-10-15 NOTE — Consult Note (Signed)
TeleNeurology Consult Note  Cody Lane     Consulting Physician: Cody Lane Code Status: No Order Primary Care Physician:  ClinicThayer Lane  DOB:  01/03/1941   Age: 77 y.o.  Date of Service: October 15, 2017 Admit Date:  10/15/2017   Admitting Diagnosis: <principal problem not specified>  Subjective:  Reason for Consultation: double vision  Chief Complaint: double vision  History of present illness: 77 y/o man presents with diploplia since he woke up. Last known well 2100. STAT teleneurology consult requested. CT head and MRI brain done and results. Patient has a brain aneurysm listed on his problem list, but he is not aware of it. 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 Pupils reported to be equal.    Review of Systems  Patient Active Problem List   Diagnosis Date Noted  . Aortic valve insufficiency S/P aortic valve replacement 03/26/2014  . Kidney mass 03/26/2014  . Essential hypertension 03/26/2014  . Atrial fibrillation ([place]) 03/26/2014  . Long term current use of anticoagulant therapy 03/26/2014  . Hyperlipidemia 03/26/2014  . CHF (congestive heart failure) ([place]) 03/26/2014  . Insomnia 03/26/2014   Past Medical History:  Diagnosis Date  . Aortic valve regurgitation 01/26/14  . Cataract 08/17/2013  . 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 CHF (congestive heart failure) ([place]) 01/26/2014  . Hypertension   . Mitral valve regurgitation 01/26/2014  . Pseudophakia 08/17/2013  . Vitamin D deficiency    Past Surgical History:  Procedure Laterality Date  . AORTIC VALVE REPLACEMENT     No Known Allergies  Social History   Socioeconomic History  . Marital status: Married    Spouse name: Not on file  . Number of children: Not on file  . Years of education: Not on file  . Highest education level: Not on file  Occupational History  . Not on file  Social Needs  . Financial resource strain: Not on file  . Food insecurity:    Worry: Not on file    Inability: Not  on file  . Transportation needs:    Medical: Not on file    Non-medical: Not on file  Tobacco Use  . Smoking status: Former Research scientist (life sciences)  . Smokeless tobacco: Never Used  Substance and Sexual Activity  . Alcohol use: No    Alcohol/week: 0.0 standard drinks  . Drug use: No  . Sexual activity: Not on file  Lifestyle  . Physical activity:    Days per week: Not on file    Minutes per session: Not on file  . Stress: Not on file  Relationships  . Social connections:    Talks on phone: Not on file    Gets together: Not on file    Attends religious service: Not on file    Active member of club or organization: Not on file    Attends meetings of clubs or organizations: Not on file    Relationship status: Not on file  . Intimate partner violence:    Fear of current or ex partner: Not on file    Emotionally abused: Not on file    Physically abused: Not on file    Forced sexual activity: Not on file  Other Topics Concern  . Not on file  Social History Narrative  . Not on file   Family History Family History  Family history unknown: Yes      Objective:  Vital signs in last 24 hours: Temp:  [97.6 F (36.4 C)] 97.6 F (  36.4 C) (10/04 1135) Pulse Rate:  [58-106] 71 (10/04 1630) Resp:  [14-20] 20 (10/04 1630) BP: (113-142)/(93-107) 129/107 (10/04 1630) SpO2:  [90 %-99 %] 99 % (10/04 1630) Temp (24hrs), Avg:97.6 F (36.4 C), Min:97.6 F (36.4 C), Max:97.6 F (36.4 C)    Intake/Output last 3 shifts: No intake/output data recorded.  Intake/Output this shift: No intake/output data recorded.  Physical Exam 1a- LOC: Keenly responsive - 0      1b- LOC questions: Answers both questions correctly - 0     1c- LOC commands- Performs both tasks correctly- 0     2- Gaze: Normal; no gaze paresis or gaze deviation - 0     3- Visual Fields: normal, no Visual field deficit - 0     4- Facial movements: left ptosis -1 5a- Right Upper limb motor - no drift -0     5b -Left Upper limb  motor - no drift -0     6a- Right Lower limb motor - no drift - 0      6b- Left Lower limb motor - no drift - 0 7- Limb Coordination: absent ataxia - 0      8- Sensory : no sensory loss - 0      9- Language - No aphasia - 0      10- Speech - No dysarthria -0     11- Neglect / Extinction - none found -0     NIHSS score   1   Diagnostic Findings:  Pertinent Labs:  Recent Labs  Lab 10/15/17 1228  WBC 8.7  MCV 94.4   Recent Labs  Lab 10/15/17 1228  CALCIUM 9.2  BUN 16  GLUCOSE 94  CREATININE 1.24  CO2 26   No results for input(s): AST, ALT, ALBUMIN in the last 168 hours.  Invalid input(s): ALK PHOS, BILIRUBIN TOTAL, BILIRUBIN DIRECT, PROTEIN TOTAL, GLOBULIN No results for input(s): TRIG in the last 168 hours.  Invalid input(s): CHLPL, HDL PML, VLDL CALC, LDL CALC, CHOL/HDL RATIO, LDL/HDL RATIO, NON-HDL CHOLESTEROL Invalid input(s): CK TOTAL, TROPONIN I, CK MB No results for input(s): APTT in the last 168 hours. No results for input(s): INR in the last 168 hours.  Invalid input(s): PROTHROMBIN TIME  Extensive review of previous medical records completed.    Assessment: Patient Active Problem List   Diagnosis Date Noted  . Aortic valve insufficiency S/P aortic valve replacement 03/26/2014  . Kidney mass 03/26/2014  . Essential hypertension 03/26/2014  . Atrial fibrillation ([place]) 03/26/2014  . Long term current use of anticoagulant therapy 03/26/2014  . Hyperlipidemia 03/26/2014  . CHF (congestive heart failure) ([place]) 03/26/2014  . 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  Recommendations: CTA head/neck to r/o vascular lesion - Will need neurosurgery consult if aneurysm is present Keep normotensive Neurochecks DVT prophylaxis    Follow up with Neurology for further testing and evaluation  Medical Decision Making:  - Extensive number of diagnosis or management options are considered above. - Extensive amount of complex data reviewed.  -  High risk of complication and/or morbidity or mortality are associated with differential diagnostic considerations above. - There may be uncertain outcome and increased probability of prolonged functional impairment or high probability of severe prolonged functional impairment associated with some of these differential diagnosis.  Medical Data Reviewed: 1.Data reviewed include clinical labs, radiology, Medical Tests; 2.Tests results discussed w/performing or interpreting physician;    3.Obtaining/reviewing old medical records; 4.Obtaining case history from another source;    5.Independent review of image, tracing or specimen.  Signed: '@MECRED'$ @   10/15/2017  4:40 PM

## 2017-10-15 NOTE — H&P (Addendum)
History and Physical    Cody Lane:[id number] DOB: 11-05-1940 DOA: 10/15/2017  PCP: [facility], [name]  Patient coming from: Home.  Chief Complaint: Diplopia and dizziness.  HPI: Cody Lane is a 77 y.o. male with history of hypertension, hyperlipidemia, cerebral aneurysm, prosthetic aortic valve replacement previous history of tobacco abuse presents to the ER at [facility] with complaints of being having diplopia since morning with dizziness.  Patient has been having some left-sided earache for last 2 days and had gone to urgent care center and was told she had some wax in the ER and had been given some drops and antibiotics.  The left ear ache persists.  And since this morning patient noticed the above symptoms.  Denies any weakness of upper or lower extremity denies any difficulty speaking or swallowing.  ED Course: In the ER patient had tele neurology consult.  Patient had MRI of the brain which was negative for stroke.  Patient had CT angiogram of the head and neck which shows possibility of left proximal internal carotid artery dissection versus thrombus.  ER physician has discussed with vascular surgeon who at this time requested no intervention at this time.  Patient admitted for further observation because given the patient has persistent vertigo and diplopia.  On exam patient is not able to headache to his left eye.  Also has ptosis.  CT angiogram of the head and neck done also shows features concerning for lymphadenopathy in the supraclavicular area which will need further work-up for ruling out lung cancer.  EKG also is showing atrial fibrillation which is new onset.  Review of Systems: As per HPI, rest all negative.   Past Medical History:  Diagnosis Date  . Aortic valve regurgitation 01/26/14  . Cataract 08/17/2013  . 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 CHF (congestive heart failure) ([place]) 01/26/2014  . Hypertension   . Mitral valve regurgitation 01/26/2014    . Pseudophakia 08/17/2013  . Vitamin D deficiency     Past Surgical History:  Procedure Laterality Date  . AORTIC VALVE REPLACEMENT       reports that he has quit smoking. He has never used smokeless tobacco. He reports that he does not drink alcohol or use drugs.  No Known Allergies  Family History  Problem Relation Age of Onset  . Stroke Father     Prior to Admission medications   Medication Sig Start Date End Date Taking? Authorizing Provider  albuterol (PROVENTIL HFA;VENTOLIN HFA) 108 (90 BASE) MCG/ACT inhaler Inhale 2 puffs into the lungs every 6 (six) hours as needed for wheezing or shortness of breath.   Yes [provider]  amLODipine (NORVASC) 10 MG tablet Take 10 mg by mouth daily.   Yes [provider]  atorvastatin (LIPITOR) 40 MG tablet Take 20 mg by mouth at bedtime.    Yes [provider]  carbamide peroxide (DEBROX) 6.5 % OTIC solution Place 3-5 drops into the left ear 2 (two) times daily.   Yes [provider]  cefUROXime (CEFTIN) 500 MG tablet Take 500 mg by mouth 2 (two) times daily.  10/07/17  Yes [provider]  fluticasone (FLONASE) 50 MCG/ACT nasal spray Place 1 spray into both nostrils daily as needed for allergies or rhinitis.   Yes [provider]  furosemide (LASIX) 20 MG tablet Take 20 mg by mouth daily.   Yes [provider]  gabapentin (NEURONTIN) 400 MG capsule Take 400 mg by mouth 3 (three) times daily.  Yes [provider]  loratadine (CLARITIN) 10 MG tablet Take 10 mg by mouth daily.   Yes [provider]  metoprolol tartrate (LOPRESSOR) 25 MG tablet Take 37.5 mg by mouth 2 (two) times daily.    Yes [provider]  tamsulosin (FLOMAX) 0.4 MG CAPS capsule Take 0.4 mg by mouth 2 (two) times daily after a meal.    Yes [provider]    Physical Exam: Vitals:   10/15/17 1900 10/15/17 2000 10/15/17 2030 10/15/17 2100  BP: (!) 117/104 (!) 124/94 (!)  131/93 (!) 104/91  Pulse: 99 75 (!) 112 85  Resp: 16  17 (!) 21  Temp:      SpO2: 94% (!) 88% 99% (!) 87%      Constitutional: Moderately built and nourished. Vitals:   10/15/17 1900 10/15/17 2000 10/15/17 2030 10/15/17 2100  BP: (!) 117/104 (!) 124/94 (!) 131/93 (!) 104/91  Pulse: 99 75 (!) 112 85  Resp: 16  17 (!) 21  Temp:      SpO2: 94% (!) 88% 99% (!) 87%   Eyes: Left eye ptosis and difficulty diabetic diet. ENMT: No discharge from the ears eyes nose or mouth. Neck: No mass or.  No neck rigidity. Respiratory: No rhonchi or crepitations. Cardiovascular: S1-S2 heard no murmurs appreciated. Abdomen: Soft nontender bowel sounds present. Musculoskeletal: No edema.  No joint effusion. Skin: No rash.  Skin appears warm. Neurologic: Alert awake oriented to time place and person.  Left eye is not able to be elected and has ptosis.  Pupils are reacting.  Moves all extremities 5 x 5.  No facial asymmetry.  Tongue is midline. Psychiatric: Appears normal per normal affect.   Labs on Admission: I have personally reviewed following labs and imaging studies  CBC: Recent Labs  Lab 10/15/17 1228  WBC 8.7  HGB 14.3  HCT 44.0  MCV 94.4  PLT 528   Basic Metabolic Panel: Recent Labs  Lab 10/15/17 1228  NA 143  K 4.2  CL 106  CO2 26  GLUCOSE 94  BUN 16  CREATININE 1.24  CALCIUM 9.2   GFR: CrCl cannot be calculated (Unknown ideal weight.). Liver Function Tests: No results for input(s): AST, ALT, ALKPHOS, BILITOT, PROT, ALBUMIN in the last 168 hours. No results for input(s): LIPASE, AMYLASE in the last 168 hours. No results for input(s): AMMONIA in the last 168 hours. Coagulation Profile: No results for input(s): INR, PROTIME in the last 168 hours. Cardiac Enzymes: No results for input(s): CKTOTAL, CKMB, CKMBINDEX, TROPONINI in the last 168 hours. BNP (last 3 results) No results for input(s): PROBNP in the last 8760 hours. HbA1C: No results for input(s): HGBA1C in the  last 72 hours. CBG: Recent Labs  Lab 10/15/17 1139  GLUCAP 84   Lipid Profile: No results for input(s): CHOL, HDL, LDLCALC, TRIG, CHOLHDL, LDLDIRECT in the last 72 hours. Thyroid Function Tests: No results for input(s): TSH, T4TOTAL, FREET4, T3FREE, THYROIDAB in the last 72 hours. Anemia Panel: No results for input(s): VITAMINB12, FOLATE, FERRITIN, TIBC, IRON, RETICCTPCT in the last 72 hours. Urine analysis:    Component Value Date/Time   COLORURINE YELLOW 10/15/2017 1850   APPEARANCEUR CLEAR 10/15/2017 1850   LABSPEC 1.032 (H) 10/15/2017 1850   PHURINE 7.0 10/15/2017 1850   GLUCOSEU NEGATIVE 10/15/2017 1850   HGBUR NEGATIVE 10/15/2017 1850   BILIRUBINUR NEGATIVE 10/15/2017 [place] NEGATIVE 10/15/2017 1850   PROTEINUR NEGATIVE 10/15/2017 1850   NITRITE NEGATIVE 10/15/2017 1850   LEUKOCYTESUR  NEGATIVE 10/15/2017 1850   Sepsis Labs: @LABRCNTIP (procalcitonin:4,lacticidven:4) )No results found for this or any previous visit (from the past 240 hour(s)).   Radiological Exams on Admission: Ct Angio Head W Or Wo Contrast  Result Date: 10/15/2017 CLINICAL DATA:  Diplopia. EXAM: CT ANGIOGRAPHY HEAD AND NECK TECHNIQUE: Multidetector CT imaging of the head and neck was performed using the standard protocol during bolus administration of intravenous contrast. Multiplanar CT image reconstructions and MIPs were obtained to evaluate the vascular anatomy. Carotid stenosis measurements (when applicable) are obtained utilizing NASCET criteria, using the distal internal carotid diameter as the denominator. CONTRAST:  127mL ISOVUE-370 IOPAMIDOL (ISOVUE-370) INJECTION 76% COMPARISON:  Head CT and MRI 10/15/2017 FINDINGS: CTA NECK FINDINGS Aortic arch: Standard 3 vessel aortic arch. Nonstenotic calcified plaque at the left subclavian artery origin. Right carotid system: Patent without evidence of stenosis or dissection. Tortuous cervical ICA with partial retropharyngeal course. Left carotid  system: Patent with minimal nonstenotic atherosclerotic plaque at the carotid bifurcation. Tortuous ICA. Subtle heterogeneous opacification of the proximal ICA with suggestion of a very thin curvilinear filling defect along the posterior wall beginning at the ICA origin and potentially extending over a length of 3 cm. Vertebral arteries: Patent with the left being strongly dominant. No evidence of dissection. Minimal nonstenotic plaque in the proximal left V1 segment. Marked diffuse hypoplasia of the right vertebral artery. Skeleton: Severe diffuse cervical disc degeneration. Other neck: Enlarged left supraclavicular lymph nodes measuring up to 2.9 x 2.0 cm. Upper chest: Mild mediastinal lymphadenopathy with a lower right paratracheal lymph node measuring 1.8 cm. Severe centrilobular emphysema. Review of the MIP images confirms the above findings CTA HEAD FINDINGS Anterior circulation: The internal carotid arteries are patent with mild nonstenotic atherosclerosis bilaterally. ACAs and MCAs are patent without evidence of proximal branch occlusion or significant stenosis. No aneurysm is identified. Posterior circulation: The intracranial left vertebral artery supplies the basilar. There is mild left V4 atherosclerosis without stenosis. The right vertebral artery terminates in PICA. Patent SCA origins are visualized bilaterally. The basilar artery is widely patent. Posterior communicating arteries are diminutive or absent. PCAs are patent without evidence of significant stenosis. No aneurysm is identified. Venous sinuses: As permitted by contrast timing, patent. Anatomic variants: Hypoplastic right vertebral artery ending in PICA. Delayed phase: No abnormal enhancement. Review of the MIP images confirms the above findings IMPRESSION: 1. Subtle curvilinear filling defect in the proximal left ICA which could reflect a thin strand of thrombus or dissection flap. 2. No carotid or vertebral artery stenosis. 3. Patent circle  of Willis without major branch occlusion or significant stenosis. 4. Left supraclavicular and mediastinal lymphadenopathy. Nonemergent chest CT is recommended to evaluate further as lung cancer is a consideration. 5. Aortic Atherosclerosis (ICD10-I70.0) and Emphysema (ICD10-J43.9). Electronically Signed   By: Logan Bores M.D.   On: 10/15/2017 18:48   Ct Head Wo Contrast  Result Date: 10/15/2017 CLINICAL DATA:  Dizziness, focal neural deficit of greater than 6 hours, stroke suspected, history CHF, hypertension, former smoker EXAM: CT HEAD WITHOUT CONTRAST TECHNIQUE: Contiguous axial images were obtained from the base of the skull through the vertex without intravenous contrast. COMPARISON:  CT head 06/03/2005 FINDINGS: Brain: Generalized atrophy. Normal ventricular morphology. No midline shift or mass effect. Small vessel chronic ischemic changes of deep cerebral white matter. Small old infarcts medial LEFT cerebellar hemisphere and LEFT caudate. No intracranial hemorrhage, mass lesion, evidence of acute infarction, or extra-axial fluid collection. Vascular: No hyperdense vessels. Atherosclerotic calcifications of the LEFT vertebral artery. Skull:  Intact Sinuses/Orbits: Clear Other: N/A IMPRESSION: Atrophy with small vessel chronic ischemic changes of deep cerebral white matter. 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 No acute intracranial abnormalities. Electronically Signed   By: Lavonia Dana M.D.   On: 10/15/2017 14:18   Ct Angio Neck W And/or Wo Contrast  Result Date: 10/15/2017 CLINICAL DATA:  Diplopia. EXAM: CT ANGIOGRAPHY HEAD AND NECK TECHNIQUE: Multidetector CT imaging of the head and neck was performed using the standard protocol during bolus administration of intravenous contrast. Multiplanar CT image reconstructions and MIPs were obtained to evaluate the vascular anatomy. Carotid stenosis measurements (when applicable) are obtained utilizing NASCET criteria, using the distal internal  carotid diameter as the denominator. CONTRAST:  176mL ISOVUE-370 IOPAMIDOL (ISOVUE-370) INJECTION 76% COMPARISON:  Head CT and MRI 10/15/2017 FINDINGS: CTA NECK FINDINGS Aortic arch: Standard 3 vessel aortic arch. Nonstenotic calcified plaque at the left subclavian artery origin. Right carotid system: Patent without evidence of stenosis or dissection. Tortuous cervical ICA with partial retropharyngeal course. Left carotid system: Patent with minimal nonstenotic atherosclerotic plaque at the carotid bifurcation. Tortuous ICA. Subtle heterogeneous opacification of the proximal ICA with suggestion of a very thin curvilinear filling defect along the posterior wall beginning at the ICA origin and potentially extending over a length of 3 cm. Vertebral arteries: Patent with the left being strongly dominant. No evidence of dissection. Minimal nonstenotic plaque in the proximal left V1 segment. Marked diffuse hypoplasia of the right vertebral artery. Skeleton: Severe diffuse cervical disc degeneration. Other neck: Enlarged left supraclavicular lymph nodes measuring up to 2.9 x 2.0 cm. Upper chest: Mild mediastinal lymphadenopathy with a lower right paratracheal lymph node measuring 1.8 cm. Severe centrilobular emphysema. Review of the MIP images confirms the above findings CTA HEAD FINDINGS Anterior circulation: The internal carotid arteries are patent with mild nonstenotic atherosclerosis bilaterally. ACAs and MCAs are patent without evidence of proximal branch occlusion or significant stenosis. No aneurysm is identified. Posterior circulation: The intracranial left vertebral artery supplies the basilar. There is mild left V4 atherosclerosis without stenosis. The right vertebral artery terminates in PICA. Patent SCA origins are visualized bilaterally. The basilar artery is widely patent. Posterior communicating arteries are diminutive or absent. PCAs are patent without evidence of significant stenosis. No aneurysm is  identified. Venous sinuses: As permitted by contrast timing, patent. Anatomic variants: Hypoplastic right vertebral artery ending in PICA. Delayed phase: No abnormal enhancement. Review of the MIP images confirms the above findings IMPRESSION: 1. Subtle curvilinear filling defect in the proximal left ICA which could reflect a thin strand of thrombus or dissection flap. 2. No carotid or vertebral artery stenosis. 3. Patent circle of Willis without major branch occlusion or significant stenosis. 4. Left supraclavicular and mediastinal lymphadenopathy. Nonemergent chest CT is recommended to evaluate further as lung cancer is a consideration. 5. Aortic Atherosclerosis (ICD10-I70.0) and Emphysema (ICD10-J43.9). Electronically Signed   By: Logan Bores M.D.   On: 10/15/2017 18:48   [name]  Result Date: 10/15/2017 CLINICAL DATA:  Dizziness and double vision. EXAM: MRI HEAD WITHOUT CONTRAST TECHNIQUE: Multiplanar, multiecho pulse sequences of the brain and surrounding structures were obtained without intravenous contrast. COMPARISON:  CT head 10/15/2017 FINDINGS: Brain: Negative for acute infarct. Moderate chronic microvascular ischemic changes throughout the white matter. Chronic ischemia in the basal ganglia bilaterally, thalamus bilaterally, and pons. Chronic hemorrhage in the left medial cerebellum. Negative for mass or fluid collection. No midline shift. Moderate atrophy. Vascular: Normal arterial flow voids Skull and upper cervical spine: Negative  Sinuses/Orbits: 1 cm Tornwaldt cyst in the nasopharynx. Paranasal sinuses clear. Bilateral cataract surgery. Other: None IMPRESSION: Negative for acute infarct. Moderate atrophy and moderate chronic microvascular ischemia. Electronically Signed   By: Franchot Gallo M.D.   On: 10/15/2017 15:56    EKG: Independently reviewed.  A. fib rate controlled.  Assessment/Plan Principal Problem:   Vertigo Active Problems:   Aortic valve insufficiency S/P  aortic valve replacement   Essential hypertension   CHF (congestive heart failure) (HCC)   Diplopia    1. Diplopia and vertigo -cause not clear.  Discussed with Dr. Lorraine Lax on-call neurologist who advised to admit for observation.  We will get physical therapy consult. 2. Possible left internal carotid artery artery dissection -observe for now.  Will get neurology input.  On aspirin. 3. Left earache could be from possible dissection of the left internal coronary artery.  No obvious discharge from the ears. 4. New onset A. Fib -we will start anticoagulation if okay with neurologist.  Chads 2 vascular score is more than 2.  Patient is on metoprolol for rate control. 5. Hypertension on amlodipine and metoprolol. 6. BPH on Flomax. 7. History of unspecified CHF on Lasix which be on hold for now. 8. History of bioprosthetic aortic valve replacement. 9. Abnormal findings in the CT angiogram of the head and neck concerning for possible lung cancer which will need CT chest in the morning after ensuring creatinine is normal.  Patient just received a CT angiogram with contrast so we will defer the CAT scan from morning.  Discussed with on-call neurologist Dr. Leonel Ramsay who advised to start anticoagulation for A. fib.   DVT prophylaxis: We will start anticoagulation for A. fib if okay with neurologist. Code Status: Full code. Family Communication: Patient's wife. Disposition Plan: Home. Consults called: Neurologist. Admission status: Observation.   Rise Patience MD [facility] Pager [phone].  If 7PM-7AM, please contact night-coverage [email or web address] Password [email or web address]  10/15/2017, 9:52 PM

## 2017-10-15 NOTE — ED Provider Notes (Addendum)
[facility] DEPT Provider Note   CSN: [id number] Arrival date & time: 10/15/17  1125     History   Chief Complaint Chief Complaint  Patient presents with  . Dizziness  . Difficulty Walking    HPI Cody Lane is a 77 y.o. male.  Patient is a 77 year old male with a history of CHF, hypertension, cerebral aneurysm and aortic valve insufficiency status post tissue valve replacement who presents with dizziness and difficulty walking.  He states that this morning he was complaining of some dizziness when he got out of bed.  He did not have any dizziness last night when he went to bed.  He describes it more of a lightheadedness rather than a spinning sensation.  He is having some difficulty with ambulation and says it is because his balance is not good.  He denies any speech deficits.  He is having some double vision which also started this morning.  He closes one eye he only sees one but if he uses both eyes he has double vision.  He denies any numbness or weakness to his extremities.  No numbness to his face.  No recent falls or trauma.  He is not on anticoagulants.  His last seen normal was last night about 9:00 when he went to bed.  When he woke up at 6 AM this morning, he was complaining of the dizziness.     Past Medical History:  Diagnosis Date  . Aortic valve regurgitation 01/26/14  . Cataract 08/17/2013  . 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 CHF (congestive heart failure) ([place]) 01/26/2014  . Hypertension   . Mitral valve regurgitation 01/26/2014  . Pseudophakia 08/17/2013  . Vitamin D deficiency     Patient Active Problem List   Diagnosis Date Noted  . Aortic valve insufficiency S/P aortic valve replacement 03/26/2014  . Kidney mass 03/26/2014  . Essential hypertension 03/26/2014  . Atrial fibrillation ([place]) 03/26/2014  . Long term current use of anticoagulant therapy 03/26/2014  . Hyperlipidemia 03/26/2014  . CHF (congestive heart failure)  ([place]) 03/26/2014  . Insomnia 03/26/2014    Past Surgical History:  Procedure Laterality Date  . AORTIC VALVE REPLACEMENT          Home Medications    Prior to Admission medications   Medication Sig Start Date End Date Taking? Authorizing Provider  albuterol (PROVENTIL HFA;VENTOLIN HFA) 108 (90 BASE) MCG/ACT inhaler Inhale 2 puffs into the lungs every 6 (six) hours as needed for wheezing or shortness of breath.   Yes [provider]  amLODipine (NORVASC) 10 MG tablet Take 10 mg by mouth daily.   Yes [provider]  atorvastatin (LIPITOR) 40 MG tablet Take 20 mg by mouth at bedtime.    Yes [provider]  carbamide peroxide (DEBROX) 6.5 % OTIC solution Place 3-5 drops into the left ear 2 (two) times daily.   Yes [provider]  cefUROXime (CEFTIN) 500 MG tablet Take 500 mg by mouth 2 (two) times daily.  10/07/17  Yes [provider]  fluticasone (FLONASE) 50 MCG/ACT nasal spray Place 1 spray into both nostrils daily as needed for allergies or rhinitis.   Yes [provider]  furosemide (LASIX) 20 MG tablet Take 20 mg by mouth daily.   Yes [provider]  gabapentin (NEURONTIN) 400 MG capsule Take 400 mg by mouth 3 (three) times daily.   Yes [provider]  loratadine (CLARITIN) 10 MG tablet Take 10 mg by mouth daily.  Yes [provider]  metoprolol tartrate (LOPRESSOR) 25 MG tablet Take 37.5 mg by mouth 2 (two) times daily.    Yes [provider]  tamsulosin (FLOMAX) 0.4 MG CAPS capsule Take 0.4 mg by mouth 2 (two) times daily after a meal.    Yes [provider]    Family History Family History  Family history unknown: Yes    Social History Social History   Tobacco Use  . Smoking status: Former Research scientist (life sciences)  . Smokeless tobacco: Never Used  Substance Use Topics  . Alcohol use: No    Alcohol/week: 0.0 standard drinks  . Drug use: No     Allergies   Patient has no known  allergies.   Review of Systems Review of Systems  Constitutional: Negative for chills, diaphoresis, fatigue and fever.  HENT: Negative for congestion, rhinorrhea and sneezing.   Eyes: Positive for visual disturbance.  Respiratory: Negative for cough, chest tightness and shortness of breath.   Cardiovascular: Negative for chest pain and leg swelling.  Gastrointestinal: Negative for abdominal pain, blood in stool, diarrhea, nausea and vomiting.  Genitourinary: Negative for difficulty urinating, flank pain, frequency and hematuria.  Musculoskeletal: Negative for arthralgias and back pain.  Skin: Negative for rash.  Neurological: Positive for dizziness. Negative for speech difficulty, weakness, numbness and headaches.     Physical Exam Updated Vital Signs BP (!) 113/93   Pulse 69   Temp 97.6 F (36.4 C)   Resp 20   SpO2 94%   Physical Exam  Constitutional: He is oriented to person, place, and time. He appears well-developed and well-nourished.  HENT:  Head: Normocephalic and atraumatic.  Eyes: Pupils are equal, round, and reactive to light.  No nystagmus present.  Patient has disconjugate gaze on rightward gaze.  He is not able to fully move his left eye to look right.  Visual fields are normal.  Neck: Normal range of motion. Neck supple.  Cardiovascular: Normal rate, regular rhythm and normal heart sounds.  Pulmonary/Chest: Effort normal and breath sounds normal. No respiratory distress. He has no wheezes. He has no rales. He exhibits no tenderness.  Abdominal: Soft. Bowel sounds are normal. There is no tenderness. There is no rebound and no guarding.  Musculoskeletal: Normal range of motion. He exhibits no edema.  Lymphadenopathy:    He has no cervical adenopathy.  Neurological: He is alert and oriented to person, place, and time.  Motor 5 out of 5 all extremities, sensation grossly intact light touch all extremities, no pronator drift, finger-nose intact, no facial drooping,  visual fields full to confrontation, he has a disruption in his extraocular eye movements as described previously.  Skin: Skin is warm and dry. No rash noted.  Psychiatric: He has a normal mood and affect.     ED Treatments / Results  Labs (all labs ordered are listed, but only abnormal results are displayed) Labs Reviewed  BASIC METABOLIC PANEL - Abnormal; Notable for the following components:      Result Value   GFR calc non Af Amer 54 (*)    All other components within normal limits  CBC  URINALYSIS, ROUTINE W REFLEX MICROSCOPIC  CBG MONITORING, ED    EKG EKG Interpretation  Date/Time:  Friday October 15 2017 11:39:54 EDT Ventricular Rate:  76 PR Interval:    QRS Duration: 95 QT Interval:  431 QTC Calculation: 485 R Axis:   28 Text Interpretation:  Atrial fibrillation Minimal ST depression, inferior leads Borderline prolonged QT interval Baseline wander  in lead(s) V1 Confirmed by Malvin Johns [phone]) on 10/15/2017 1:42:26 PM   Radiology Ct Head Wo Contrast  Result Date: 10/15/2017 CLINICAL DATA:  Dizziness, focal neural deficit of greater than 6 hours, stroke suspected, history CHF, hypertension, former smoker EXAM: CT HEAD WITHOUT CONTRAST TECHNIQUE: Contiguous axial images were obtained from the base of the skull through the vertex without intravenous contrast. COMPARISON:  CT head 06/03/2005 FINDINGS: Brain: Generalized atrophy. Normal ventricular morphology. No midline shift or mass effect. Small vessel chronic ischemic changes of deep cerebral white matter. Small old infarcts medial LEFT cerebellar hemisphere and LEFT caudate. No intracranial hemorrhage, mass lesion, evidence of acute infarction, or extra-axial fluid collection. Vascular: No hyperdense vessels. Atherosclerotic calcifications of the LEFT vertebral artery. Skull: Intact Sinuses/Orbits: Clear Other: N/A IMPRESSION: Atrophy with small vessel chronic ischemic changes of deep cerebral white matter. 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 No acute intracranial abnormalities. Electronically Signed   By: Lavonia Dana M.D.   On: 10/15/2017 14:18    Procedures Procedures (including critical care time)  Medications Ordered in ED Medications - No data to display   Initial Impression / Assessment and Plan / ED Course  I have reviewed the triage vital signs and the nursing notes.  Pertinent labs & imaging results that were available during my care of the patient were reviewed by me and considered in my medical decision making (see chart for details).     Patient is a 77 year old male who presents with dizziness, inability to walk due to his balance and disconjugate gaze.  CT scan does not show any acute abnormality.  MRI is pending.  His labs are non-concerning.   MR normal, will consult teleneuro.  Care turned over to Dr. Sedonia Small. Final Clinical Impressions(s) / ED Diagnoses   Final diagnoses:  None    ED Discharge Orders    None       Malvin Johns, MD 10/15/17 [id number]    Malvin Johns, MD 10/15/17 [phone]

## 2017-10-15 NOTE — ED Notes (Signed)
Pt given a [place] sandwich and ginger ale.  Wife given a sandwich also.

## 2017-10-15 NOTE — ED Notes (Signed)
Pt to CT

## 2017-10-15 NOTE — ED Notes (Signed)
Secretary reports Neurosurgery has been consulted.

## 2017-10-15 NOTE — ED Notes (Signed)
Pt upset with decision for admission.  This Probation officer and EDP attempted to reason w/ patient and provide additional information.  Pt seems most upset that he hasn't been able to eat all day.  This Probation officer will get patient food.

## 2017-10-15 NOTE — ED Triage Notes (Signed)
Patient here with wife with complaints of dizziness and "double vision", difficulty ambulating due to dizzines. Wife states that she thinks it may be from having his ears cleaned yesterday. Currently taking a antibiotic for URI.

## 2017-10-15 NOTE — ED Notes (Signed)
[facility] at bedside 

## 2017-10-16 ENCOUNTER — Observation Stay (HOSPITAL_BASED_OUTPATIENT_CLINIC_OR_DEPARTMENT_OTHER): Payer: Medicare Other

## 2017-10-16 DIAGNOSIS — R591 Generalized enlarged lymph nodes: Secondary | ICD-10-CM | POA: Diagnosis not present

## 2017-10-16 DIAGNOSIS — I679 Cerebrovascular disease, unspecified: Secondary | ICD-10-CM | POA: Diagnosis present

## 2017-10-16 DIAGNOSIS — I340 Nonrheumatic mitral (valve) insufficiency: Secondary | ICD-10-CM | POA: Diagnosis not present

## 2017-10-16 DIAGNOSIS — Z87891 Personal history of nicotine dependence: Secondary | ICD-10-CM | POA: Diagnosis not present

## 2017-10-16 DIAGNOSIS — I10 Essential (primary) hypertension: Secondary | ICD-10-CM

## 2017-10-16 DIAGNOSIS — H4902 Third [oculomotor] nerve palsy, left eye: Secondary | ICD-10-CM | POA: Diagnosis present

## 2017-10-16 DIAGNOSIS — C7800 Secondary malignant neoplasm of unspecified lung: Secondary | ICD-10-CM | POA: Diagnosis present

## 2017-10-16 DIAGNOSIS — R590 Localized enlarged lymph nodes: Secondary | ICD-10-CM | POA: Diagnosis present

## 2017-10-16 DIAGNOSIS — R918 Other nonspecific abnormal finding of lung field: Secondary | ICD-10-CM | POA: Diagnosis present

## 2017-10-16 DIAGNOSIS — Z8679 Personal history of other diseases of the circulatory system: Secondary | ICD-10-CM | POA: Diagnosis not present

## 2017-10-16 DIAGNOSIS — E785 Hyperlipidemia, unspecified: Secondary | ICD-10-CM | POA: Diagnosis present

## 2017-10-16 DIAGNOSIS — H9202 Otalgia, left ear: Secondary | ICD-10-CM | POA: Diagnosis present

## 2017-10-16 DIAGNOSIS — R42 Dizziness and giddiness: Secondary | ICD-10-CM | POA: Diagnosis not present

## 2017-10-16 DIAGNOSIS — I4581 Long QT syndrome: Secondary | ICD-10-CM | POA: Diagnosis present

## 2017-10-16 DIAGNOSIS — I6381 Other cerebral infarction due to occlusion or stenosis of small artery: Secondary | ICD-10-CM | POA: Diagnosis present

## 2017-10-16 DIAGNOSIS — R402143 Coma scale, eyes open, spontaneous, at hospital admission: Secondary | ICD-10-CM | POA: Diagnosis present

## 2017-10-16 DIAGNOSIS — H532 Diplopia: Secondary | ICD-10-CM | POA: Diagnosis present

## 2017-10-16 DIAGNOSIS — I080 Rheumatic disorders of both mitral and aortic valves: Secondary | ICD-10-CM | POA: Diagnosis present

## 2017-10-16 DIAGNOSIS — I4891 Unspecified atrial fibrillation: Secondary | ICD-10-CM

## 2017-10-16 DIAGNOSIS — R05 Cough: Secondary | ICD-10-CM | POA: Diagnosis present

## 2017-10-16 DIAGNOSIS — R402363 Coma scale, best motor response, obeys commands, at hospital admission: Secondary | ICD-10-CM | POA: Diagnosis present

## 2017-10-16 DIAGNOSIS — I482 Chronic atrial fibrillation, unspecified: Secondary | ICD-10-CM | POA: Diagnosis present

## 2017-10-16 DIAGNOSIS — Z823 Family history of stroke: Secondary | ICD-10-CM | POA: Diagnosis not present

## 2017-10-16 DIAGNOSIS — I5022 Chronic systolic (congestive) heart failure: Secondary | ICD-10-CM | POA: Diagnosis present

## 2017-10-16 DIAGNOSIS — I110 Hypertensive heart disease with heart failure: Secondary | ICD-10-CM | POA: Diagnosis present

## 2017-10-16 DIAGNOSIS — I509 Heart failure, unspecified: Secondary | ICD-10-CM | POA: Diagnosis not present

## 2017-10-16 DIAGNOSIS — I639 Cerebral infarction, unspecified: Secondary | ICD-10-CM | POA: Diagnosis not present

## 2017-10-16 DIAGNOSIS — R402253 Coma scale, best verbal response, oriented, at hospital admission: Secondary | ICD-10-CM | POA: Diagnosis present

## 2017-10-16 DIAGNOSIS — R29700 NIHSS score 0: Secondary | ICD-10-CM | POA: Diagnosis present

## 2017-10-16 DIAGNOSIS — Z79899 Other long term (current) drug therapy: Secondary | ICD-10-CM | POA: Diagnosis not present

## 2017-10-16 DIAGNOSIS — N400 Enlarged prostate without lower urinary tract symptoms: Secondary | ICD-10-CM | POA: Diagnosis present

## 2017-10-16 LAB — GLUCOSE, CAPILLARY: Glucose-Capillary: 102 mg/dL — ABNORMAL HIGH (ref 70–99)

## 2017-10-16 LAB — BASIC METABOLIC PANEL
Anion gap: 9 (ref 5–15)
BUN: 10 mg/dL (ref 8–23)
CALCIUM: 8.7 mg/dL — AB (ref 8.9–10.3)
CHLORIDE: 104 mmol/L (ref 98–111)
CO2: 25 mmol/L (ref 22–32)
CREATININE: 1.14 mg/dL (ref 0.61–1.24)
GFR calc non Af Amer: >60 mL/min (ref >60)
Glucose, Bld: 98 mg/dL (ref 70–99)
Potassium: 3.8 mmol/L (ref 3.5–5.1)
Sodium: 138 mmol/L (ref 135–145)

## 2017-10-16 LAB — LIPID PANEL
Cholesterol: 68 mg/dL (ref 0–200)
HDL: 34 mg/dL — AB (ref >40)
LDL CALC: 23 mg/dL (ref 0–99)
Total CHOL/HDL Ratio: 2 RATIO
Triglycerides: 55 mg/dL (ref <150)
VLDL: 11 mg/dL (ref 0–40)

## 2017-10-16 LAB — ECHOCARDIOGRAM COMPLETE
Height: 71 in
Weight: 2934.76 oz

## 2017-10-16 MED ORDER — WARFARIN SODIUM 5 MG PO TABS
5.0000 mg | ORAL_TABLET | Freq: Once | ORAL | Status: AC
  Administered 2017-10-16: 5 mg via ORAL
  Filled 2017-10-16: qty 1

## 2017-10-16 MED ORDER — CARBAMIDE PEROXIDE 6.5 % OT SOLN
3.0000 [drp] | Freq: Two times a day (BID) | OTIC | Status: DC
  Administered 2017-10-16 (×2): 4 [drp] via OTIC
  Administered 2017-10-17: 3 [drp] via OTIC
  Administered 2017-10-17: 5 [drp] via OTIC
  Administered 2017-10-18: 3 [drp] via OTIC
  Filled 2017-10-16: qty 15

## 2017-10-16 MED ORDER — HYPROMELLOSE (GONIOSCOPIC) 2.5 % OP SOLN
2.0000 [drp] | Freq: Four times a day (QID) | OPHTHALMIC | Status: DC | PRN
  Administered 2017-10-16: 2 [drp] via OPHTHALMIC
  Filled 2017-10-16: qty 15

## 2017-10-16 MED ORDER — WARFARIN - PHARMACIST DOSING INPATIENT: Freq: Every day | Status: DC

## 2017-10-16 MED ORDER — HEPARIN (PORCINE) IN NACL 100-0.45 UNIT/ML-% IJ SOLN
1150.0000 [IU]/h | INTRAMUSCULAR | Status: DC
  Administered 2017-10-16: 1150 [IU]/h via INTRAVENOUS
  Filled 2017-10-16: qty 250

## 2017-10-16 MED ORDER — POLYVINYL ALCOHOL 1.4 % OP SOLN: 1.0000 [drp] | OPHTHALMIC | Status: DC | PRN

## 2017-10-16 NOTE — Progress Notes (Signed)
STROKE TEAM PROGRESS NOTE   HISTORY OF PRESENT ILLNESS (per record - Dr Arrie Eastern consult)) Cody Lane is an 77 y.o. male past medical history of hypertension, hyperlipidemia,  congestive heart failure, aortic valve replacement, brain aneurysm, and atrial fibrillation not on anticoagulation presents with gait imbalance on waking up on morning of 10/4.  He was fine when he went to bed around 9 PM the night before.  Waking up the patient had trouble walking to the bathroom and felt little off balance.  He and his wife are planning to go to breakfast in [place] and patient felt worse and he wanted to [facility] emergency room.  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  A CT head and neck was obtained to look for aneurysm.  He however showed a filling defect in the proximal left ICA which likely represents thrombus, no dissection was in the differential diagnosis.  Patient denies any neck pain/trauma.   Date last known well: 10.3.19 Time last known well: 3pm tPA Given: no, outside window NIHSS: 0 Baseline MRS 0   SUBJECTIVE (INTERVAL HISTORY) Doing well, not in any acute distress. Still complains of Diplopia. States his imbalance was more presyncope yesterday and since then has resolved.    OBJECTIVE Vitals:   10/16/17 0116 10/16/17 0230 10/16/17 0430 10/16/17 0630  BP:  (!) 108/95 (!) 107/92 (!) 113/99  Pulse:  79 83 78  Resp:   16 16  Temp: 98.2 F (36.8 C) 97.8 F (36.6 C) 97.7 F (36.5 C) 98.1 F (36.7 C)  TempSrc:  Oral Oral Oral  SpO2:  97% 93% 93%  Weight:      Height:        CBC:  Recent Labs  Lab 10/15/17 1228  WBC 8.7  HGB 14.3  HCT 44.0  MCV 94.4  PLT 242    Basic Metabolic Panel:  Recent Labs  Lab 10/15/17 1228 10/16/17 0721  NA 143 138  K 4.2 3.8  CL 106 104  CO2 26 25  GLUCOSE 94 98  BUN 16 10  CREATININE 1.24 1.14  CALCIUM 9.2 8.7*    Lipid Panel:     Component Value Date/Time   CHOL 68  10/16/2017 0721   TRIG 55 10/16/2017 0721   HDL 34 (L) 10/16/2017 0721   CHOLHDL 2.0 10/16/2017 0721   VLDL 11 10/16/2017 0721   LDLCALC 23 10/16/2017 0721   HgbA1c: No results found for: HGBA1C Urine Drug Screen: No results found for: LABOPIA, COCAINSCRNUR, LABBENZ, AMPHETMU, THCU, LABBARB  Alcohol Level No results found for: ETH    IMAGING  Ct Angio Head W Or Wo Contrast Ct Angio Neck W And/or Wo Contrast 10/15/2017 IMPRESSION:  1. Subtle curvilinear filling defect in the proximal left ICA which could reflect a thin strand of thrombus or dissection flap.  2. No carotid or vertebral artery stenosis.  3. Patent circle of Willis without major branch occlusion or significant stenosis.  4. Left supraclavicular and mediastinal lymphadenopathy. Nonemergent chest CT is recommended to evaluate further as lung cancer is a consideration.  5. Aortic Atherosclerosis (ICD10-I70.0) and Emphysema (ICD10-J43.9).   Ct Head Wo Contrast 10/15/2017 IMPRESSION:  Atrophy with small vessel chronic ischemic changes of deep cerebral white matter.  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  No acute intracranial abnormalities.    [name] 10/15/2017 IMPRESSION:  Negative for acute infarct. Moderate atrophy and moderate chronic microvascular ischemia.    Dg Chest  2 View 10/15/2017 IMPRESSION:  1. Tortuous prominent thoracic aorta with no comparison. Aneurysm is not excluded on this study.  2. Prominent pulmonary arteries raises the possibility of pulmonary arterial hypertension.  3. No acute abnormalities otherwise seen.     Repeat MRI MRV WO Contrast - pending   Transthoracic Echocardiogram - Completed 00/00/00      PHYSICAL EXAM Blood pressure (!) 113/99, pulse 78, temperature 98.1 F (36.7 C), temperature source Oral, resp. rate 16, height 5\' 11"  (1.803 m), weight 83.2 kg, SpO2 93 %.  General: Appears well-developed  Psych: Affect appropriate to situation Eyes: No  scleral injection HENT: No OP obstrucion Head: Normocephalic.  Cardiovascular: Normal rate and regular rhythm.  Respiratory: Effort normal and breath sounds normal to anterior ascultation GI: Soft. No distension. There is no tenderness.  Skin: WDI   Neurological Examination Mental Status: Alert, oriented, thought content appropriate.  Speech fluent without evidence of aphasia. Able to follow 3 step commands without difficulty. Cranial Nerves: II: Visual fields grossly normal,  III,IV, VI: ptosis not present, left eye  Down and out, unable to adduct round, reactive to light and accommodation V,VII: smile symmetric, facial light touch sensation normal bilaterally VIII: hearing normal bilaterally IX,X: uvula rises symmetrically XI: bilateral shoulder shrug XII: midline tongue extension Motor: Right :  Upper extremity   5/5                                      Left:     Upper extremity   5/5             Lower extremity   5/5                                                  Lower extremity   5/5 Tone and bulk:normal tone throughout; no atrophy noted Sensory: Pinprick and light touch intact throughout, bilaterally Deep Tendon Reflexes: 2+ and symmetric throughout Plantars: Right: downgoing                                Left: downgoing Cerebellar: normal finger-to-nose, normal rapid alternating movements and normal heel-to-shin test Gait: normal gait and station     ASSESSMENT/PLAN Mr. VIRAAJ Lane is a 77 y.o. male with history of hypertension, hyperlipidemia,  congestive heart failure, aortic valve replacement, brain aneurysm, and atrial fibrillation not on anticoagulation presenting with gait disturbance and third nerve palsy. He did not receive IV t-PA due to late presentation.  Possible Diabetic third vs Microvascular disease vs stroke   CT head - No acute intracranial abnormalities. 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   MRI head - Negative for acute  infarct.  MRA head - - not performed  CTA H&N -  Subtle curvilinear filling defect in the proximal left ICA which could reflect a thin strand of thrombus or dissection flap.   Carotid Doppler - CTA neck performed or pending - carotid dopplers not indicated.  Repeat MR MRV WO Contrast - pending  2D Echo - Completed  LDL - 23  HgbA1c - pending  VTE prophylaxis - IV Heparin  Diet  - Heart healthy with thin liquids.  No antithrombotic prior to admission, now  on IV heparin drip due to Afib and being bridged to couamdin  Patient counseled to be compliant with his antithrombotic medications  Ongoing aggressive stroke risk factor management  Therapy recommendations:  pending  Disposition:  Pending  Hypertension  BP tends to run low. . Permissive hypertension (OK if < 220/120) but gradually normalize in 5-7 days . Long-term BP goal normotensive  Hyperlipidemia  Lipid lowering medication PTA:  Lipitor 20 mg daily  LDL 23, goal < 70  Current lipid lowering medication: Lipitor 20 mg daily  Continue statin at discharge   Other Stroke Risk Factors  Advanced age  Former cigarette smoker - quit  Hx stroke/TIA by imaging  Family hx stroke (father)   Other Active Problems  Left supraclavicular and mediastinal lymphadenopathy. Nonemergent chest CT is recommended to evaluate further as lung cancer is a consideration.     PLAN  IV heparin -> coumadin per pharmacy for afib.  Repeat MRI / MRV in 24 hrs   Addendum:  Do not strongly feel this is a stroke. A isolated partial third nerve palsy without any other focal deficits is very low probability of stroke  Most likely from diabetes vs microvascular disease  Would hold off on any further stroke workup at this time. Will repeat MRI and MRV in 24hrs to reassess for possible small posterior circulation stroke and assess cavernous sinus. If negative would treat for microvascular disease with lipid control, currently  on anticoagulation and depending on what his A1c is.    Hospital day # 0    To contact Stroke Continuity provider, please refer to [email or web address] After hours, contact General Neurology

## 2017-10-16 NOTE — Progress Notes (Signed)
ANTICOAGULATION CONSULT NOTE - Initial Consult  Pharmacy Consult for warfarin Indication: atrial fibrillation  No Known Allergies  Patient Measurements: Height: 5\' 11"  (180.3 cm) Weight: 183 lb 6.8 oz (83.2 kg) IBW/kg (Calculated) : 75.3 Heparin Dosing Weight: 83.2  Vital Signs: Temp: 98.1 F (36.7 C) (10/05 0630) Temp Source: Oral (10/05 0630) BP: 113/99 (10/05 0630) Pulse Rate: 78 (10/05 0630)  Labs: Recent Labs    10/15/17 1228 10/16/17 0721  HGB 14.3  --   HCT 44.0  --   PLT 279  --   CREATININE 1.24 1.14    Estimated Creatinine Clearance: 57.8 mL/min (by C-G formula based on SCr of 1.14 mg/dL).   Medical History: Past Medical History:  Diagnosis Date  . Aortic valve regurgitation 01/26/14  . Cataract 08/17/2013  . 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 CHF (congestive heart failure) ([place]) 01/26/2014  . Hypertension   . Mitral valve regurgitation 01/26/2014  . Pseudophakia 08/17/2013  . Vitamin D deficiency     Medications:    Assessment: 77 yo M with presented with double vision/ gait imbalance due to probably stroke. Pt was briefly started on heparin for Afib but pharmacy consult changed to warfarin.  No bleeding documented. CBC WNL. Not on anticoagulation PTA.    Goal of Therapy:  INR 2-3 Monitor platelets by anticoagulation protocol: Yes   Plan:  Warfarin 5mg  x1 Daily INR, monitor for s/s bleeding  Harrietta Guardian, PharmD PGY1 Pharmacy Resident 10/16/2017    11:27 AM

## 2017-10-16 NOTE — Evaluation (Signed)
Speech Language Pathology Evaluation Patient Details Name: Cody Lane MRN: [id number] DOB: 02/23/1940 Today's Date: 10/16/2017 Time: 1030-1050 SLP Time Calculation (min) (ACUTE ONLY): 20 min  Problem List:  Patient Active Problem List   Diagnosis Date Noted  . Vertigo 10/15/2017  . Diplopia 10/15/2017  . Cough   . Aortic valve insufficiency S/P aortic valve replacement 03/26/2014  . Kidney mass 03/26/2014  . Essential hypertension 03/26/2014  . Unspecified atrial fibrillation ([place]) 03/26/2014  . Long term current use of anticoagulant therapy 03/26/2014  . Hyperlipidemia 03/26/2014  . CHF (congestive heart failure) ([place]) 03/26/2014  . Insomnia 03/26/2014   Past Medical History:  Past Medical History:  Diagnosis Date  . Aortic valve regurgitation 01/26/14  . Cataract 08/17/2013  . 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 CHF (congestive heart failure) ([place]) 01/26/2014  . Hypertension   . Mitral valve regurgitation 01/26/2014  . Pseudophakia 08/17/2013  . Vitamin D deficiency    Past Surgical History:  Past Surgical History:  Procedure Laterality Date  . AORTIC VALVE REPLACEMENT     HPI:  Patient is a 77 y.o. male with PMH: HTN, hyperlipidemia, prior cerebral aneurysm, prosthetic aortic valve replacement, tobacco abuse in remission, who presented to ED with c/o seeing double since previous morning as well as feeling dizzy and weak on left side. Initial MRI was negative for CVA, but did show moderate atrophy and moderate chronic   Assessment / Plan / Recommendation Clinical Impression  Patient presents with a mild cognitive communication deficit with main difficulties in his decreased anticipatory awareness of impact of deficit and overall slow cognitive processing. Patient's spouse did not indicate any cognitive impairment prior to this admission, but both patient and spouse stated that in the past year, he has significantly decreased in his activity level. This clinician suspects  that patient already had some cognitive impairment prior to this admission that is likely related to aging and moderate cerebral atrophy which was found on CT of head. OT and PT are both recommending home health versus outpatient therapies, but as his spouse manages medications, finances, etc. and is able to monitor and assist him at home,do not feel there is a benefit for home health SLP services at this time.     SLP Assessment  SLP Recommendation/Assessment: Patient does not need any further Speech Lanaguage Pathology Services SLP Visit Diagnosis: Cognitive communication deficit (R41.841)    Follow Up Recommendations  None    Frequency and Duration     N/A      SLP Evaluation Cognition  Overall Cognitive Status: Impaired/Different from baseline Orientation Level: Oriented X4 Memory: Appears intact Awareness: Impaired Awareness Impairment: Anticipatory impairment Safety/Judgment: Impaired Comments: Patient does not appear to be impulsive, but does not seem to fully appreciate the impact his deficits have on his function and safety.       Comprehension  Auditory Comprehension Overall Auditory Comprehension: Appears within functional limits for tasks assessed    Expression Expression Primary Mode of Expression: Verbal Verbal Expression Overall Verbal Expression: Appears within functional limits for tasks assessed Written Expression Dominant Hand: Right   Oral / Motor  Oral Motor/Sensory Function Overall Oral Motor/Sensory Function: Within functional limits Motor Speech Overall Motor Speech: Appears within functional limits for tasks assessed   [place], MA, CCC-SLP 10/16/17 2:26 PM

## 2017-10-16 NOTE — Progress Notes (Signed)
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  Neurology was consulted and recommended admission for observation.  Initial MRI was negative for CVA.  He had a CT angiogram of the head and neck which showed possibility of left proximal internal carotid artery dissection versus thrombus.  Subjective: -Still seeing double this morning, denies any dizziness, denies any left-sided weakness.  No chest pain, no palpitations  Assessment & Plan: Principal Problem:   Vertigo Active Problems:   Aortic valve insufficiency S/P aortic valve replacement   Essential hypertension   Unspecified atrial fibrillation (HCC)   CHF (congestive heart failure) (HCC)   Diplopia   TIA/diplopia/vertigo -Because not clear, I discussed with Dr. Lorraine Lax, suspect a posterior stroke which may not be seen on the MRI, stroke team to see today, appreciate input  Possible left ICA carotid artery dissection -Observe for now  Chronic A. fib -Per prior notes and 16 patient had A. fib back then, and he was on Coumadin.  He does not recall being on blood thinners ever.  Start heparin drip per pharmacy, he is on metoprolol for rate control  Hypertension -On amlodipine and metoprolol  History of unspecified CHF on Lasix -2D echo pending, hold Lasix  History of bioprosthetic valve replacement  Abnormal findings in the CT angiogram of the head and neck concerning for possible lung cancer -will need CT chest in a day after ensuring creatinine is normal.  Patient just received a CT angiogram with contrast so we will defer the CAT  scan for now. Alternatively could get as an outpatient next week  Scheduled Meds: . amLODipine  10 mg Oral Daily  . atorvastatin  20 mg Oral QHS  . cefdinir  300 mg Oral Q12H  . folic acid  1 mg Oral Daily  . gabapentin  400 mg Oral TID  . metoprolol tartrate  37.5 mg Oral BID  . multivitamin with minerals  1 tablet Oral QAC breakfast  . tamsulosin  0.4 mg Oral BID PC  . thiamine  100 mg Oral Daily   Or  . thiamine  100 mg Intravenous Daily  . warfarin  5 mg Oral ONCE-1800  . Warfarin - Pharmacist Dosing Inpatient   Does not apply q1800   Continuous Infusions: PRN Meds:.acetaminophen **OR** acetaminophen (TYLENOL) oral liquid 160 mg/5 mL **OR** acetaminophen, albuterol, fluticasone, LORazepam **OR** LORazepam  DVT prophylaxis: Heparin infusion Code Status: Full code Family Communication: No family present at bedside Disposition Plan: To be determined  Consultants:   Neurology  Procedures:   2D echo: Pending  Antimicrobials:  None  Objective: Vitals:   10/16/17 0116 10/16/17 0230 10/16/17 0430 10/16/17 0630  BP:  (!) 108/95 (!) 107/92 (!) 113/99  Pulse:  79 83 78  Resp:   16 16  Temp: 98.2 F (36.8 C) 97.8 F (36.6 C) 97.7 F (36.5 C) 98.1 F (36.7 C)  TempSrc:  Oral Oral Oral  SpO2:  97% 93% 93%  Weight:      Height:       No intake or output data in the 24 hours ending 10/16/17 1228 Filed Weights   10/15/17 2238  Weight:  83.2 kg    Examination:  Constitutional: NAD Eyes:  lids and conjunctivae normal ENMT: Mucous membranes are moist. No oropharyngeal exudates Neck: normal, supple Respiratory: clear to auscultation bilaterally, no wheezing, no crackles. Normal respiratory effort. No accessory muscle use.  Cardiovascular: irregular, soft 2/6 SEM. No LE edema. 2+ pedal pulses. No carotid bruits.  Abdomen: no tenderness. Bowel sounds positive.  Musculoskeletal: no clubbing / cyanosis.  Skin: no rashes Neurologic: left CN 3 palsy. Strength 5/5 in all  4.  Psychiatric: Normal judgment and insight. Alert and oriented x 3. Normal mood.    Data Reviewed: I have independently reviewed following labs and imaging studies   CBC: Recent Labs  Lab 10/15/17 1228  WBC 8.7  HGB 14.3  HCT 44.0  MCV 94.4  PLT 283   Basic Metabolic Panel: Recent Labs  Lab 10/15/17 1228 10/16/17 0721  NA 143 138  K 4.2 3.8  CL 106 104  CO2 26 25  GLUCOSE 94 98  BUN 16 10  CREATININE 1.24 1.14  CALCIUM 9.2 8.7*   GFR: Estimated Creatinine Clearance: 57.8 mL/min (by C-G formula based on SCr of 1.14 mg/dL). Liver Function Tests: No results for input(s): AST, ALT, ALKPHOS, BILITOT, PROT, ALBUMIN in the last 168 hours. No results for input(s): LIPASE, AMYLASE in the last 168 hours. No results for input(s): AMMONIA in the last 168 hours. Coagulation Profile: No results for input(s): INR, PROTIME in the last 168 hours. Cardiac Enzymes: No results for input(s): CKTOTAL, CKMB, CKMBINDEX, TROPONINI in the last 168 hours. BNP (last 3 results) No results for input(s): PROBNP in the last 8760 hours. HbA1C: No results for input(s): HGBA1C in the last 72 hours. CBG: Recent Labs  Lab 10/15/17 1139  GLUCAP 84   Lipid Profile: Recent Labs    10/16/17 0721  CHOL 68  HDL 34*  LDLCALC 23  TRIG 55  CHOLHDL 2.0   Thyroid Function Tests: No results for input(s): TSH, T4TOTAL, FREET4, T3FREE, THYROIDAB in the last 72 hours. Anemia Panel: No results for input(s): VITAMINB12, FOLATE, FERRITIN, TIBC, IRON, RETICCTPCT in the last 72 hours. Urine analysis:    Component Value Date/Time   COLORURINE YELLOW 10/15/2017 1850   APPEARANCEUR CLEAR 10/15/2017 1850   LABSPEC 1.032 (H) 10/15/2017 1850   PHURINE 7.0 10/15/2017 1850   GLUCOSEU NEGATIVE 10/15/2017 1850   HGBUR NEGATIVE 10/15/2017 1850   BILIRUBINUR NEGATIVE 10/15/2017 1850   KETONESUR NEGATIVE 10/15/2017 1850   PROTEINUR NEGATIVE 10/15/2017 1850   NITRITE NEGATIVE 10/15/2017 1850   LEUKOCYTESUR  NEGATIVE 10/15/2017 1850   Sepsis Labs: Invalid input(s): PROCALCITONIN, LACTICIDVEN  No results found for this or any previous visit (from the past 240 hour(s)).    Radiology Studies: Ct Angio Head W Or Wo Contrast  Result Date: 10/15/2017 CLINICAL DATA:  Diplopia. EXAM: CT ANGIOGRAPHY HEAD AND NECK TECHNIQUE: Multidetector CT imaging of the head and neck was performed using the standard protocol during bolus administration of intravenous contrast. Multiplanar CT image reconstructions and MIPs were obtained to evaluate the vascular anatomy. Carotid stenosis measurements (when applicable) are obtained utilizing NASCET criteria, using the distal internal carotid diameter as the denominator. CONTRAST:  175mL ISOVUE-370 IOPAMIDOL (ISOVUE-370) INJECTION 76% COMPARISON:  Head CT and MRI 10/15/2017 FINDINGS: CTA NECK FINDINGS Aortic arch: Standard 3 vessel aortic arch. Nonstenotic calcified plaque at the left subclavian artery origin. Right carotid system: Patent without evidence of stenosis or dissection. Tortuous cervical ICA with partial retropharyngeal course. Left carotid system: Patent with minimal nonstenotic atherosclerotic  plaque at the carotid bifurcation. Tortuous ICA. Subtle heterogeneous opacification of the proximal ICA with suggestion of a very thin curvilinear filling defect along the posterior wall beginning at the ICA origin and potentially extending over a length of 3 cm. Vertebral arteries: Patent with the left being strongly dominant. No evidence of dissection. Minimal nonstenotic plaque in the proximal left V1 segment. Marked diffuse hypoplasia of the right vertebral artery. Skeleton: Severe diffuse cervical disc degeneration. Other neck: Enlarged left supraclavicular lymph nodes measuring up to 2.9 x 2.0 cm. Upper chest: Mild mediastinal lymphadenopathy with a lower right paratracheal lymph node measuring 1.8 cm. Severe centrilobular emphysema. Review of the MIP images confirms the above  findings CTA HEAD FINDINGS Anterior circulation: The internal carotid arteries are patent with mild nonstenotic atherosclerosis bilaterally. ACAs and MCAs are patent without evidence of proximal branch occlusion or significant stenosis. No aneurysm is identified. Posterior circulation: The intracranial left vertebral artery supplies the basilar. There is mild left V4 atherosclerosis without stenosis. The right vertebral artery terminates in PICA. Patent SCA origins are visualized bilaterally. The basilar artery is widely patent. Posterior communicating arteries are diminutive or absent. PCAs are patent without evidence of significant stenosis. No aneurysm is identified. Venous sinuses: As permitted by contrast timing, patent. Anatomic variants: Hypoplastic right vertebral artery ending in PICA. Delayed phase: No abnormal enhancement. Review of the MIP images confirms the above findings IMPRESSION: 1. Subtle curvilinear filling defect in the proximal left ICA which could reflect a thin strand of thrombus or dissection flap. 2. No carotid or vertebral artery stenosis. 3. Patent circle of Willis without major branch occlusion or significant stenosis. 4. Left supraclavicular and mediastinal lymphadenopathy. Nonemergent chest CT is recommended to evaluate further as lung cancer is a consideration. 5. Aortic Atherosclerosis (ICD10-I70.0) and Emphysema (ICD10-J43.9). Electronically Signed   By: Logan Bores M.D.   On: 10/15/2017 18:48   Dg Chest 2 View  Result Date: 10/15/2017 CLINICAL DATA:  Dizziness. EXAM: CHEST - 2 VIEW COMPARISON:  None. FINDINGS: Patient is status post aortic valve replacement. There is prominence of the aorta with tortuosity. Aneurysm cannot be excluded on this study. No pneumothorax. No overt edema. Probable bleb in the lateral right mid lung. No focal infiltrate, nodule, or mass. The pulmonary arteries are prominent as well. IMPRESSION: 1. Tortuous prominent thoracic aorta with no comparison.  Aneurysm is not excluded on this study. 2. Prominent pulmonary arteries raises the possibility of pulmonary arterial hypertension. 3. No acute abnormalities otherwise seen. Electronically Signed   By: Dorise Bullion III M.D   On: 10/15/2017 22:12   Ct Head Wo Contrast  Result Date: 10/15/2017 CLINICAL DATA:  Dizziness, focal neural deficit of greater than 6 hours, stroke suspected, history CHF, hypertension, former smoker EXAM: CT HEAD WITHOUT CONTRAST TECHNIQUE: Contiguous axial images were obtained from the base of the skull through the vertex without intravenous contrast. COMPARISON:  CT head 06/03/2005 FINDINGS: Brain: Generalized atrophy. Normal ventricular morphology. No midline shift or mass effect. Small vessel chronic ischemic changes of deep cerebral white matter. Small old infarcts medial LEFT cerebellar hemisphere and LEFT caudate. No intracranial hemorrhage, mass lesion, evidence of acute infarction, or extra-axial fluid collection. Vascular: No hyperdense vessels. Atherosclerotic calcifications of the LEFT vertebral artery. Skull: Intact Sinuses/Orbits: Clear Other: N/A IMPRESSION: Atrophy with small vessel chronic ischemic changes of deep cerebral white matter. 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 No acute intracranial abnormalities. Electronically Signed   By: Lavonia Dana M.D.   On: 10/15/2017 14:18  Ct Angio Neck W And/or Wo Contrast  Result Date: 10/15/2017 CLINICAL DATA:  Diplopia. EXAM: CT ANGIOGRAPHY HEAD AND NECK TECHNIQUE: Multidetector CT imaging of the head and neck was performed using the standard protocol during bolus administration of intravenous contrast. Multiplanar CT image reconstructions and MIPs were obtained to evaluate the vascular anatomy. Carotid stenosis measurements (when applicable) are obtained utilizing NASCET criteria, using the distal internal carotid diameter as the denominator. CONTRAST:  131mL ISOVUE-370 IOPAMIDOL (ISOVUE-370) INJECTION 76%  COMPARISON:  Head CT and MRI 10/15/2017 FINDINGS: CTA NECK FINDINGS Aortic arch: Standard 3 vessel aortic arch. Nonstenotic calcified plaque at the left subclavian artery origin. Right carotid system: Patent without evidence of stenosis or dissection. Tortuous cervical ICA with partial retropharyngeal course. Left carotid system: Patent with minimal nonstenotic atherosclerotic plaque at the carotid bifurcation. Tortuous ICA. Subtle heterogeneous opacification of the proximal ICA with suggestion of a very thin curvilinear filling defect along the posterior wall beginning at the ICA origin and potentially extending over a length of 3 cm. Vertebral arteries: Patent with the left being strongly dominant. No evidence of dissection. Minimal nonstenotic plaque in the proximal left V1 segment. Marked diffuse hypoplasia of the right vertebral artery. Skeleton: Severe diffuse cervical disc degeneration. Other neck: Enlarged left supraclavicular lymph nodes measuring up to 2.9 x 2.0 cm. Upper chest: Mild mediastinal lymphadenopathy with a lower right paratracheal lymph node measuring 1.8 cm. Severe centrilobular emphysema. Review of the MIP images confirms the above findings CTA HEAD FINDINGS Anterior circulation: The internal carotid arteries are patent with mild nonstenotic atherosclerosis bilaterally. ACAs and MCAs are patent without evidence of proximal branch occlusion or significant stenosis. No aneurysm is identified. Posterior circulation: The intracranial left vertebral artery supplies the basilar. There is mild left V4 atherosclerosis without stenosis. The right vertebral artery terminates in PICA. Patent SCA origins are visualized bilaterally. The basilar artery is widely patent. Posterior communicating arteries are diminutive or absent. PCAs are patent without evidence of significant stenosis. No aneurysm is identified. Venous sinuses: As permitted by contrast timing, patent. Anatomic variants: Hypoplastic right  vertebral artery ending in PICA. Delayed phase: No abnormal enhancement. Review of the MIP images confirms the above findings IMPRESSION: 1. Subtle curvilinear filling defect in the proximal left ICA which could reflect a thin strand of thrombus or dissection flap. 2. No carotid or vertebral artery stenosis. 3. Patent circle of Willis without major branch occlusion or significant stenosis. 4. Left supraclavicular and mediastinal lymphadenopathy. Nonemergent chest CT is recommended to evaluate further as lung cancer is a consideration. 5. Aortic Atherosclerosis (ICD10-I70.0) and Emphysema (ICD10-J43.9). Electronically Signed   By: Logan Bores M.D.   On: 10/15/2017 18:48   [name]  Result Date: 10/15/2017 CLINICAL DATA:  Dizziness and double vision. EXAM: MRI HEAD WITHOUT CONTRAST TECHNIQUE: Multiplanar, multiecho pulse sequences of the brain and surrounding structures were obtained without intravenous contrast. COMPARISON:  CT head 10/15/2017 FINDINGS: Brain: Negative for acute infarct. Moderate chronic microvascular ischemic changes throughout the white matter. Chronic ischemia in the basal ganglia bilaterally, thalamus bilaterally, and pons. Chronic hemorrhage in the left medial cerebellum. Negative for mass or fluid collection. No midline shift. Moderate atrophy. 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 Paranasal sinuses clear. Bilateral cataract surgery. Other: None IMPRESSION: Negative for acute infarct. Moderate atrophy and moderate chronic microvascular ischemia. Electronically Signed   By: Franchot Gallo M.D.   On: 10/15/2017 15:56  Marzetta Board, MD, PhD [facility] Pager [phone]  If 7PM-7AM, please contact night-coverage [email or web address] Password [email or web address] 10/16/2017, 12:28 PM

## 2017-10-16 NOTE — Progress Notes (Signed)
OT Evaluation  PTA, pt living at home with wife and was independent with ADL and mobility. Pt presents with apparent gait deficit in addition to complaints of double vision due to apparent oculomotor dysfunction. Nasal portion of L eye occluded to reduce symptoms of diplopia and improve functional vision. Began education with wife/pt regarding use of partial occlusion. Recommend pt follow up with OT after DC. Pt currently seeing OT in the outpt setting with the [facility] to work on home modifications. Pt will need to follow up with OT for therapy as well. Will continue to follow acutely.     10/16/17 1100  OT Visit Information  Last OT Received On 10/16/17  Assistance Needed +1  History of Present Illness 77 y.o. male past medical history of hypertension, hyperlipidemia,  congestive heart failure, aortic valve replacement, brain aneurysm, and atrial fibrillation on anticoagulation presents with gait imbalance on waking up on morning of 10/4 and visual deficits as well as vertigo.  Precautions  Precautions Fall  Precaution Comments double vision  Home Living  Family/patient expects to be discharged to: Private residence  Living Arrangements Spouse/significant other  Available Help at Discharge Family;Available 24 hours/day  Type of Home House  Home Access Level entry (threshold step)  Home Layout One level  Bathroom Biomedical scientist Yes  How Accessible Accessible via walker  [place] - 2 wheels;Cane - single point;Shower seat  Additional Comments In process of having bathroom remodeled as handicap accessible  Prior Function  Level of Independence Independent with assistive device(s)  Comments usual use of cane  Communication  Communication HOH  Pain Assessment  Pain Assessment No/denies pain  Cognition  Arousal/Alertness Awake/alert  Behavior During Therapy WFL for tasks assessed/performed  Overall Cognitive Status  Impaired/Different from baseline  Area of Impairment Memory;Attention;Awareness  Current Attention Level Selective  Memory Decreased short-term memory  Awareness Emergent  General Comments slow processing  Upper Extremity Assessment  Upper Extremity Assessment Overall WFL for tasks assessed  Lower Extremity Assessment  Lower Extremity Assessment Defer to PT evaluation  Cervical / Trunk Assessment  Cervical / Trunk Assessment Kyphotic  ADL  Overall ADL's  Needs assistance/impaired  Eating/Feeding Modified independent  Grooming Set up;Supervision/safety;Standing  Upper Body Bathing Supervision/ safety;Set up;Sitting  Lower Body Bathing Minimal assistance;Sit to/from stand  Upper Body Dressing  Set up;Supervision/safety;Sitting  Lower Body Dressing Minimal assistance;Sit to/from Retail buyer Minimal assistance;Ambulation  Toileting- Water quality scientist and Hygiene Supervision/safety;Sit to/from stand  Functional mobility during ADLs Minimal assistance  General ADL Comments LOB when turning to sit on bed, requiring physical assist from therapist to prevent fall  Vision- History  Baseline Vision/History Wears glasses  Wears Glasses Reading only  Patient Visual Report Diplopia;Eye fatigue/eye pain/headache  Vision- Assessment  Vision Assessment? Yes  Eye Alignment Impaired (comment)  Ocular Range of Motion Restricted on the left;Impaired-to be further tested in functional context  Alignment/Gaze Preference Head turned;Head tilt  Tracking/Visual Pursuits Left eye does not track medially;Decreased smoothness of horizontal tracking;Decreased smoothness of vertical tracking;Impaired - to be further tested in functional context  Saccades Decreased speed of saccadic movement;Additional head turns occurred during testing;Impaired - to be further tested in functional context  Convergence Impaired (comment)  Visual Fields No apparent deficits  Diplopia Assessment Disappears with one  eye closed;Objects split side to side;Objects split on top of one another;Other (comment) (inconsistent response)  Depth Perception Overshoots (reaches offcenter @ 5 inches to the right)  Bed  Mobility  General bed mobility comments received EOB  Transfers  Overall transfer level Needs assistance  Equipment used None  Transfers Sit to/from Stand  Sit to Stand Supervision  General transfer comment supervision for safety and stability  Balance  Overall balance assessment Needs assistance  Sitting balance-Leahy Scale Good  Standing balance-Leahy Scale Poor  Standing balance comment needs external support; physical LOB when turning  High Level Balance Comments patient with noted instbaility during higher level task performance, relinace on UE supprt at this time  OT - End of Session  Equipment Utilized During Treatment Gait belt  Activity Tolerance Patient tolerated treatment well  Patient left in bed;with call bell/phone within reach;with family/visitor present  Nurse Communication Mobility status;Other (comment) (use of partial occlusion)  OT Assessment  OT Recommendation/Assessment Patient needs continued OT Services  OT Visit Diagnosis Unsteadiness on feet (R26.81);Muscle weakness (generalized) (M62.81);Other symptoms and signs involving cognitive function  OT Problem List Decreased strength;Impaired balance (sitting and/or standing);Impaired vision/perception;Decreased knowledge of use of DME or AE  OT Plan  OT Frequency (ACUTE ONLY) Min 3X/week  OT Treatment/Interventions (ACUTE ONLY) Self-care/ADL training;Therapeutic activities;DME and/or AE instruction;Neuromuscular education;Cognitive remediation/compensation;Visual/perceptual remediation/compensation;Patient/family education;Balance training  AM-PAC OT "6 Clicks" Daily Activity Outcome Measure  Help from another person eating meals? 4  Help from another person taking care of personal grooming? 3  Help from another person  toileting, which includes using toliet, bedpan, or urinal? 3  Help from another person bathing (including washing, rinsing, drying)? 3  Help from another person to put on and taking off regular upper body clothing? 3  Help from another person to put on and taking off regular lower body clothing? 3  6 Click Score 19  ADL G Code Conversion CK  OT Recommendation  Recommendations for Other Services Speech consult;Other (comment) (follow up with his eye doctor)  Follow Up Recommendations Supervision/Assistance - 24 hour;Outpatient OT  OT Equipment None recommended by OT  Individuals Consulted  Consulted and Agree with Results and Recommendations Patient;Family member/caregiver  Family Member Consulted wife  Acute Rehab OT Goals  Patient Stated Goal to walk better and see  OT Goal Formulation With patient/family  Time For Goal Achievement 10/30/17  Potential to Achieve Goals Good  OT Time Calculation  OT Start Time (ACUTE ONLY) 1005  OT Stop Time (ACUTE ONLY) 1030  OT Time Calculation (min) 25 min  OT General Charges  $OT Visit 1 Visit  OT Evaluation  $OT Eval Moderate Complexity 1 Mod  OT Treatments  $Therapeutic Activity 8-22 mins  Written Expression  Dominant Hand Right  Maurie Boettcher, OT/L   Acute OT Clinical Specialist [place] Pager [phone] Office [phone]

## 2017-10-16 NOTE — Evaluation (Signed)
Physical Therapy Evaluation Patient Details Name: Cody Lane MRN: [id number] DOB: [birth year] Today's Date: 10/16/2017   History of Present Illness  77 y.o. male past medical history of hypertension, hyperlipidemia,  congestive heart failure, aortic valve replacement, brain aneurysm, and atrial fibrillation on anticoagulation presents with gait imbalance on waking up on morning of 10/4 and visual deficits as well as vertigo.  Clinical Impression  Orders received for PT evaluation. Patient demonstrates deficits in functional mobility as indicated below. Will benefit from continued skilled PT to address deficits and maximize function. Will see as indicated and progress as tolerated.  At this time, feel patient would benefit from outpatient PT services to address balance deviations and mobility deficits. Recommend use of his RW upon cute discharge.    Follow Up Recommendations Outpatient PT    Equipment Recommendations  (Use of his RW)    Recommendations for Other Services       Precautions / Restrictions Precautions Precautions: Fall Precaution Comments: visual deficits at this time      Mobility  Bed Mobility               General bed mobility comments: received EOB  Transfers Overall transfer level: Needs assistance Equipment used: None Transfers: Sit to/from Stand Sit to Stand: Supervision         General transfer comment: supervision for safety and stability  Ambulation/Gait Ambulation/Gait assistance: Min guard Gait Distance (Feet): 140 Feet Assistive device: IV Pole Gait Pattern/deviations: Step-through pattern;Decreased stride length;Trunk flexed;Drifts right/left;Narrow base of support Gait velocity: decreased   General Gait Details: noted instability, reliance on UE support at this time  Science writer    Modified Rankin (Stroke Patients Only) Modified Rankin (Stroke Patients Only) Pre-Morbid Rankin Score: No  significant disability Modified Rankin: Moderate disability     Balance Overall balance assessment: Needs assistance   Sitting balance-Leahy Scale: Good       Standing balance-Leahy Scale: Fair               High level balance activites: Backward walking;Direction changes;Turns;Sudden stops;Head turns High Level Balance Comments: patient with noted instbaility during higher level task performance, relinace on UE supprt at this time             Pertinent Vitals/Pain Pain Assessment: No/denies pain    Home Living Family/patient expects to be discharged to:: Private residence Living Arrangements: Spouse/significant other Available Help at Discharge: Family;Available 24 hours/day Type of Home: House Home Access: Level entry(threshold step)     Home Layout: One level Home Equipment: Walker - 2 wheels;Cane - single point;Shower seat      Prior Function Level of Independence: Independent with assistive device(s)         Comments: usual use of cane     Hand Dominance   Dominant Hand: Right    Extremity/Trunk Assessment   Upper Extremity Assessment Upper Extremity Assessment: Overall WFL for tasks assessed    Lower Extremity Assessment Lower Extremity Assessment: Overall WFL for tasks assessed       Communication   Communication: HOH  Cognition Arousal/Alertness: Awake/alert Behavior During Therapy: WFL for tasks assessed/performed Overall Cognitive Status: Within Functional Limits for tasks assessed                                        General Comments  Exercises     Assessment/Plan    PT Assessment Patient needs continued PT services  PT Problem List Decreased strength;Decreased activity tolerance;Decreased balance;Decreased mobility;Decreased coordination       PT Treatment Interventions DME instruction;Gait training;Functional mobility training;Therapeutic activities;Therapeutic exercise;Balance  training;Neuromuscular re-education;Patient/family education    PT Goals (Current goals can be found in the Care Plan section)  Acute Rehab PT Goals Patient Stated Goal: to walk better PT Goal Formulation: With patient/family Time For Goal Achievement: 10/30/17 Potential to Achieve Goals: Good    Frequency Min 3X/week   Barriers to discharge        Co-evaluation               AM-PAC PT "6 Clicks" Daily Activity  Outcome Measure Difficulty turning over in bed (including adjusting bedclothes, sheets and blankets)?: None Difficulty moving from lying on back to sitting on the side of the bed? : A Little Difficulty sitting down on and standing up from a chair with arms (e.g., wheelchair, bedside commode, etc,.)?: A Little Help needed moving to and from a bed to chair (including a wheelchair)?: A Little Help needed walking in hospital room?: A Little Help needed climbing 3-5 steps with a railing? : A Little 6 Click Score: 19    End of Session   Activity Tolerance: Patient tolerated treatment well Patient left: in bed;with call bell/phone within reach;with nursing/sitter in room(sitting EOB) Nurse Communication: Mobility status PT Visit Diagnosis: Unsteadiness on feet (R26.81);Difficulty in walking, not elsewhere classified (R26.2)    Time: 1157-2620 PT Time Calculation (min) (ACUTE ONLY): 19 min   Charges:   PT Evaluation $PT Eval Moderate Complexity: 1 Mod          Cody Lane, PT DPT  Board Certified Neurologic Specialist Acute Rehabilitation Services Pager [phone] Office [phone]   Cody Lane 10/16/2017, 10:22 AM

## 2017-10-16 NOTE — Progress Notes (Signed)
  Echocardiogram 2D Echocardiogram has been performed.  Cody Lane 10/16/2017, 11:36 AM

## 2017-10-16 NOTE — Consult Note (Signed)
Requesting Physician: Dr. Hal Hope    Chief Complaint: Double vision   History obtained from: Patient and Chart     HPI:                                                                                                                                       Cody Lane is an 77 y.o. male past medical history of hypertension, hyperlipidemia,  congestive heart failure, aortic valve replacement, brain aneurysm, and atrial fibrillation on anticoagulation presents with gait imbalance on waking up on morning of 10/4.  He was fine when he went to bed around 9 PM the night before.  Waking up the patient had trouble walking to the bathroom and felt little off balance.  He and his wife are planning to go to breakfast in [place] and patient felt worse and he wanted to [facility] long emergency room.  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  A CT head and neck was obtained to look for aneurysm.  He however showed a filling defect in the proximal left ICA which likely represents thrombus, no dissection was in the differential diagnosis.  Patient denies any neck pain/trauma.    Date last known well: 10.3.19 Time last known well: 3pm tPA Given: no, outside window NIHSS: 0 Baseline MRS 0    Past Medical History:  Diagnosis Date  . Aortic valve regurgitation 01/26/14  . Cataract 08/17/2013  . 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 CHF (congestive heart failure) ([place]) 01/26/2014  . Hypertension   . Mitral valve regurgitation 01/26/2014  . Pseudophakia 08/17/2013  . Vitamin D deficiency     Past Surgical History:  Procedure Laterality Date  . AORTIC VALVE REPLACEMENT      Family History  Problem Relation Age of Onset  . Stroke Father    Social History:  reports that he has quit smoking. He has never used smokeless tobacco. He reports that he does not drink alcohol or use drugs.  Allergies: No Known Allergies  Medications:                                                                                                                         I reviewed home medications   ROS:  14 systems reviewed and negative except above    Examination:                                                                                                      General: Appears well-developed  Psych: Affect appropriate to situation Eyes: No scleral injection HENT: No OP obstrucion Head: Normocephalic.  Cardiovascular: Normal rate and regular rhythm.  Respiratory: Effort normal and breath sounds normal to anterior ascultation GI: Soft.  No distension. There is no tenderness.  Skin: WDI    Neurological Examination Mental Status: Alert, oriented, thought content appropriate.  Speech fluent without evidence of aphasia. Able to follow 3 step commands without difficulty. Cranial Nerves: II: Visual fields grossly normal,  III,IV, VI: ptosis not present, left eye  Down and out, unable to adduct round, reactive to light and accommodation V,VII: smile symmetric, facial light touch sensation normal bilaterally VIII: hearing normal bilaterally IX,X: uvula rises symmetrically XI: bilateral shoulder shrug XII: midline tongue extension Motor: Right : Upper extremity   5/5    Left:     Upper extremity   5/5  Lower extremity   5/5     Lower extremity   5/5 Tone and bulk:normal tone throughout; no atrophy noted Sensory: Pinprick and light touch intact throughout, bilaterally Deep Tendon Reflexes: 2+ and symmetric throughout Plantars: Right: downgoing   Left: downgoing Cerebellar: normal finger-to-nose, normal rapid alternating movements and normal heel-to-shin test Gait: normal gait and station     Lab Results: Basic Metabolic Panel: Recent Labs  Lab 10/15/17 1228  NA 143  K 4.2  CL 106  CO2 26  GLUCOSE 94  BUN 16  CREATININE 1.24   CALCIUM 9.2    CBC: Recent Labs  Lab 10/15/17 1228  WBC 8.7  HGB 14.3  HCT 44.0  MCV 94.4  PLT 279    Coagulation Studies: No results for input(s): LABPROT, INR in the last 72 hours.  Imaging: Ct Angio Head W Or Wo Contrast  Result Date: 10/15/2017 CLINICAL DATA:  Diplopia. EXAM: CT ANGIOGRAPHY HEAD AND NECK TECHNIQUE: Multidetector CT imaging of the head and neck was performed using the standard protocol during bolus administration of intravenous contrast. Multiplanar CT image reconstructions and MIPs were obtained to evaluate the vascular anatomy. Carotid stenosis measurements (when applicable) are obtained utilizing NASCET criteria, using the distal internal carotid diameter as the denominator. CONTRAST:  155mL ISOVUE-370 IOPAMIDOL (ISOVUE-370) INJECTION 76% COMPARISON:  Head CT and MRI 10/15/2017 FINDINGS: CTA NECK FINDINGS Aortic arch: Standard 3 vessel aortic arch. Nonstenotic calcified plaque at the left subclavian artery origin. Right carotid system: Patent without evidence of stenosis or dissection. Tortuous cervical ICA with partial retropharyngeal course. Left carotid system: Patent with minimal nonstenotic atherosclerotic plaque at the carotid bifurcation. Tortuous ICA. Subtle heterogeneous opacification of the proximal ICA with suggestion of a very thin curvilinear filling defect along the posterior wall beginning at the ICA origin and potentially extending over a length of 3 cm. Vertebral arteries: Patent with the left being strongly dominant. No evidence of dissection. Minimal nonstenotic plaque in the proximal  left V1 segment. Marked diffuse hypoplasia of the right vertebral artery. Skeleton: Severe diffuse cervical disc degeneration. Other neck: Enlarged left supraclavicular lymph nodes measuring up to 2.9 x 2.0 cm. Upper chest: Mild mediastinal lymphadenopathy with a lower right paratracheal lymph node measuring 1.8 cm. Severe centrilobular emphysema. Review of the MIP images  confirms the above findings CTA HEAD FINDINGS Anterior circulation: The internal carotid arteries are patent with mild nonstenotic atherosclerosis bilaterally. ACAs and MCAs are patent without evidence of proximal branch occlusion or significant stenosis. No aneurysm is identified. Posterior circulation: The intracranial left vertebral artery supplies the basilar. There is mild left V4 atherosclerosis without stenosis. The right vertebral artery terminates in PICA. Patent SCA origins are visualized bilaterally. The basilar artery is widely patent. Posterior communicating arteries are diminutive or absent. PCAs are patent without evidence of significant stenosis. No aneurysm is identified. Venous sinuses: As permitted by contrast timing, patent. Anatomic variants: Hypoplastic right vertebral artery ending in PICA. Delayed phase: No abnormal enhancement. Review of the MIP images confirms the above findings IMPRESSION: 1. Subtle curvilinear filling defect in the proximal left ICA which could reflect a thin strand of thrombus or dissection flap. 2. No carotid or vertebral artery stenosis. 3. Patent circle of Willis without major branch occlusion or significant stenosis. 4. Left supraclavicular and mediastinal lymphadenopathy. Nonemergent chest CT is recommended to evaluate further as lung cancer is a consideration. 5. Aortic Atherosclerosis (ICD10-I70.0) and Emphysema (ICD10-J43.9). Electronically Signed   By: Logan Bores M.D.   On: 10/15/2017 18:48   Dg Chest 2 View  Result Date: 10/15/2017 CLINICAL DATA:  Dizziness. EXAM: CHEST - 2 VIEW COMPARISON:  None. FINDINGS: Patient is status post aortic valve replacement. There is prominence of the aorta with tortuosity. Aneurysm cannot be excluded on this study. No pneumothorax. No overt edema. Probable bleb in the lateral right mid lung. No focal infiltrate, nodule, or mass. The pulmonary arteries are prominent as well. IMPRESSION: 1. Tortuous prominent thoracic aorta  with no comparison. Aneurysm is not excluded on this study. 2. Prominent pulmonary arteries raises the possibility of pulmonary arterial hypertension. 3. No acute abnormalities otherwise seen. Electronically Signed   By: Dorise Bullion III M.D   On: 10/15/2017 22:12   Ct Head Wo Contrast  Result Date: 10/15/2017 CLINICAL DATA:  Dizziness, focal neural deficit of greater than 6 hours, stroke suspected, history CHF, hypertension, former smoker EXAM: CT HEAD WITHOUT CONTRAST TECHNIQUE: Contiguous axial images were obtained from the base of the skull through the vertex without intravenous contrast. COMPARISON:  CT head 06/03/2005 FINDINGS: Brain: Generalized atrophy. Normal ventricular morphology. No midline shift or mass effect. Small vessel chronic ischemic changes of deep cerebral white matter. Small old infarcts medial LEFT cerebellar hemisphere and LEFT caudate. No intracranial hemorrhage, mass lesion, evidence of acute infarction, or extra-axial fluid collection. Vascular: No hyperdense vessels. Atherosclerotic calcifications of the LEFT vertebral artery. Skull: Intact Sinuses/Orbits: Clear Other: N/A IMPRESSION: Atrophy with small vessel chronic ischemic changes of deep cerebral white matter. 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 No acute intracranial abnormalities. Electronically Signed   By: Lavonia Dana M.D.   On: 10/15/2017 14:18   Ct Angio Neck W And/or Wo Contrast  Result Date: 10/15/2017 CLINICAL DATA:  Diplopia. EXAM: CT ANGIOGRAPHY HEAD AND NECK TECHNIQUE: Multidetector CT imaging of the head and neck was performed using the standard protocol during bolus administration of intravenous contrast. Multiplanar CT image reconstructions and MIPs were obtained to evaluate the vascular anatomy. Carotid stenosis measurements (  when applicable) are obtained utilizing NASCET criteria, using the distal internal carotid diameter as the denominator. CONTRAST:  19mL ISOVUE-370 IOPAMIDOL (ISOVUE-370)  INJECTION 76% COMPARISON:  Head CT and MRI 10/15/2017 FINDINGS: CTA NECK FINDINGS Aortic arch: Standard 3 vessel aortic arch. Nonstenotic calcified plaque at the left subclavian artery origin. Right carotid system: Patent without evidence of stenosis or dissection. Tortuous cervical ICA with partial retropharyngeal course. Left carotid system: Patent with minimal nonstenotic atherosclerotic plaque at the carotid bifurcation. Tortuous ICA. Subtle heterogeneous opacification of the proximal ICA with suggestion of a very thin curvilinear filling defect along the posterior wall beginning at the ICA origin and potentially extending over a length of 3 cm. Vertebral arteries: Patent with the left being strongly dominant. No evidence of dissection. Minimal nonstenotic plaque in the proximal left V1 segment. Marked diffuse hypoplasia of the right vertebral artery. Skeleton: Severe diffuse cervical disc degeneration. Other neck: Enlarged left supraclavicular lymph nodes measuring up to 2.9 x 2.0 cm. Upper chest: Mild mediastinal lymphadenopathy with a lower right paratracheal lymph node measuring 1.8 cm. Severe centrilobular emphysema. Review of the MIP images confirms the above findings CTA HEAD FINDINGS Anterior circulation: The internal carotid arteries are patent with mild nonstenotic atherosclerosis bilaterally. ACAs and MCAs are patent without evidence of proximal branch occlusion or significant stenosis. No aneurysm is identified. Posterior circulation: The intracranial left vertebral artery supplies the basilar. There is mild left V4 atherosclerosis without stenosis. The right vertebral artery terminates in PICA. Patent SCA origins are visualized bilaterally. The basilar artery is widely patent. Posterior communicating arteries are diminutive or absent. PCAs are patent without evidence of significant stenosis. No aneurysm is identified. Venous sinuses: As permitted by contrast timing, patent. Anatomic variants:  Hypoplastic right vertebral artery ending in PICA. Delayed phase: No abnormal enhancement. Review of the MIP images confirms the above findings IMPRESSION: 1. Subtle curvilinear filling defect in the proximal left ICA which could reflect a thin strand of thrombus or dissection flap. 2. No carotid or vertebral artery stenosis. 3. Patent circle of Willis without major branch occlusion or significant stenosis. 4. Left supraclavicular and mediastinal lymphadenopathy. Nonemergent chest CT is recommended to evaluate further as lung cancer is a consideration. 5. Aortic Atherosclerosis (ICD10-I70.0) and Emphysema (ICD10-J43.9). Electronically Signed   By: Logan Bores M.D.   On: 10/15/2017 18:48   [name]  Result Date: 10/15/2017 CLINICAL DATA:  Dizziness and double vision. EXAM: MRI HEAD WITHOUT CONTRAST TECHNIQUE: Multiplanar, multiecho pulse sequences of the brain and surrounding structures were obtained without intravenous contrast. COMPARISON:  CT head 10/15/2017 FINDINGS: Brain: Negative for acute infarct. Moderate chronic microvascular ischemic changes throughout the white matter. Chronic ischemia in the basal ganglia bilaterally, thalamus bilaterally, and pons. Chronic hemorrhage in the left medial cerebellum. Negative for mass or fluid collection. No midline shift. Moderate atrophy. 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 Paranasal sinuses clear. Bilateral cataract surgery. Other: None IMPRESSION: Negative for acute infarct. Moderate atrophy and moderate chronic microvascular ischemia. Electronically Signed   By: Franchot Gallo M.D.   On: 10/15/2017 15:56     ASSESSMENT AND PLAN  With history of atrial fibrillation, hypertension, hyperlipidemia presents to the emergency room with gait imbalance on waking up this morning.  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  While  isolated 3rd nerve palsies do occur, given his gait difficulty I suspect a posterior circulation  stroke likely missed on MRI.  Suspect MRI negative stroke   Recommend # MRI of the brain without contrast #MRA Head and neck  #Transthoracic Echo  # Start patient on ASA 325mg  daily. Considering starting AC  For Afib #Start or continue Atorvastatin 40 mg/other high intensity statin # BP goal: permissive HTN upto 220/120 mmHg ( 185/110 if patient has CHF, CKD) # HBAIC and Lipid profile # Telemetry monitoring # Frequent neuro checks #  stroke swallow screen  Please page stroke NP  Or  PA  Or MD from 8am -4 pm  as this patient from this time will be  followed by the stroke.   You can look them up on [email or web address]  Password [facility]   Samier Jaco [facility] Pager Number [phone]

## 2017-10-17 ENCOUNTER — Inpatient Hospital Stay (HOSPITAL_COMMUNITY): Payer: Medicare Other

## 2017-10-17 ENCOUNTER — Other Ambulatory Visit: Payer: Self-pay

## 2017-10-17 DIAGNOSIS — R918 Other nonspecific abnormal finding of lung field: Secondary | ICD-10-CM

## 2017-10-17 LAB — CBC
HCT: 43.2 % (ref 39.0–52.0)
HEMOGLOBIN: 13.7 g/dL (ref 13.0–17.0)
MCH: 30 pg (ref 26.0–34.0)
MCHC: 31.7 g/dL (ref 30.0–36.0)
MCV: 94.5 fL (ref 78.0–100.0)
Platelets: 233 10*3/uL (ref 150–400)
RBC: 4.57 MIL/uL (ref 4.22–5.81)
RDW: 11.5 % (ref 11.5–15.5)
WBC: 7.5 10*3/uL (ref 4.0–10.5)

## 2017-10-17 LAB — PROTIME-INR
INR: 1.12
PROTHROMBIN TIME: 14.3 s (ref 11.4–15.2)

## 2017-10-17 LAB — HEMOGLOBIN A1C
HEMOGLOBIN A1C: 5.2 % (ref 4.8–5.6)
Mean Plasma Glucose: 103 mg/dL

## 2017-10-17 MED ORDER — WARFARIN SODIUM 7.5 MG PO TABS
7.5000 mg | ORAL_TABLET | Freq: Once | ORAL | Status: AC
  Administered 2017-10-17: 7.5 mg via ORAL
  Filled 2017-10-17: qty 1

## 2017-10-17 MED ORDER — IOHEXOL 350 MG/ML SOLN
75.0000 mL | Freq: Once | INTRAVENOUS | Status: AC | PRN
  Administered 2017-10-17: 75 mL via INTRAVENOUS

## 2017-10-17 NOTE — Progress Notes (Signed)
Physical Therapy Treatment Patient Details Name: Cody Lane MRN: [id number] DOB: 09-07-1940 Today's Date: 10/17/2017    History of Present Illness 77 y.o. male past medical history of hypertension, hyperlipidemia,  congestive heart failure, aortic valve replacement, brain aneurysm, and atrial fibrillation on anticoagulation presents with gait imbalance on waking up on morning of 10/4 and visual deficits as well as vertigo.    PT Comments    Progressing with mobility, improved activity tolerance with taped glasses for visual strategies. Use of RW and without device. Patient with continued instability requiring external support. Improvements noted in activity tolerance. Current POC remains appropriate.   Follow Up Recommendations  Outpatient PT     Equipment Recommendations  (Use of his RW)    Recommendations for Other Services       Precautions / Restrictions Precautions Precautions: Fall Precaution Comments: double vision, poor safety awareness     Mobility  Bed Mobility Overal bed mobility: Needs Assistance Bed Mobility: Supine to Sit;Sit to Supine     Supine to sit: Supervision Sit to supine: Supervision      Transfers Overall transfer level: Needs assistance Equipment used: Rolling walker (2 wheeled) Transfers: Sit to/from [facility] Sit to Stand: Supervision Stand pivot transfers: Min guard       General transfer comment: assist for safety   Ambulation/Gait Ambulation/Gait assistance: Min guard;Min assist Gait Distance (Feet): 210 Feet Assistive device: Rolling walker (2 wheeled);1 person hand held assist Gait Pattern/deviations: Step-through pattern;Decreased stride length;Trunk flexed;Drifts right/left;Narrow base of support Gait velocity: decreased   General Gait Details: Max cues for positioning within RW, come instability and staggering noted, 2 episodes of balance checks. 38 ft with HHA and pacing with some improvements in  stability   Stairs             Wheelchair Mobility    Modified Rankin (Stroke Patients Only) Modified Rankin (Stroke Patients Only) Pre-Morbid Rankin Score: No significant disability Modified Rankin: Moderate disability     Balance Overall balance assessment: Needs assistance Sitting-balance support: Feet supported Sitting balance-Leahy Scale: Good     Standing balance support: Single extremity supported Standing balance-Leahy Scale: Poor Standing balance comment: requires external support                            Cognition Arousal/Alertness: Awake/alert Behavior During Therapy: WFL for tasks assessed/performed Overall Cognitive Status: Impaired/Different from baseline Area of Impairment: Memory;Attention;Safety/judgement;Awareness;Problem solving                   Current Attention Level: Selective Memory: Decreased short-term memory   Safety/Judgement: Decreased awareness of deficits;Decreased awareness of safety Awareness: Intellectual Problem Solving: Slow processing;Difficulty sequencing;Requires verbal cues;Requires tactile cues General Comments: patient with some decreased carryover of education, multi modal cues throughout session. Wife continues to report this is not baseline cognition      Exercises      General Comments General comments (skin integrity, edema, etc.): Discussed addition of grab bars in bathroom.  Wife reports they are working with [facility] for modifications to new bathroom.  Discussed that optimally a walk in shower would be best if they have that option       Pertinent Vitals/Pain Pain Assessment: No/denies pain    Home Living                      Prior Function            PT  Goals (current goals can now be found in the care plan section) Acute Rehab PT Goals Patient Stated Goal: to walk better PT Goal Formulation: With patient/family Time For Goal Achievement: 10/30/17 Potential to Achieve Goals:  Good Progress towards PT goals: Progressing toward goals    Frequency    Min 3X/week      PT Plan Current plan remains appropriate    Co-evaluation              AM-PAC PT "6 Clicks" Daily Activity  Outcome Measure  Difficulty turning over in bed (including adjusting bedclothes, sheets and blankets)?: None Difficulty moving from lying on back to sitting on the side of the bed? : A Little Difficulty sitting down on and standing up from a chair with arms (e.g., wheelchair, bedside commode, etc,.)?: A Little Help needed moving to and from a bed to chair (including a wheelchair)?: A Little Help needed walking in hospital room?: A Little Help needed climbing 3-5 steps with a railing? : A Little 6 Click Score: 19    End of Session   Activity Tolerance: Patient tolerated treatment well Patient left: in bed;with call bell/phone within reach;with nursing/sitter in room(sitting EOB) Nurse Communication: Mobility status PT Visit Diagnosis: Unsteadiness on feet (R26.81);Difficulty in walking, not elsewhere classified (R26.2)     Time: 6568-1275 PT Time Calculation (min) (ACUTE ONLY): 20 min  Charges:  $Gait Training: 8-22 mins                     Alben Deeds, PT DPT  Board Certified Neurologic Specialist [place] Pager [phone] Office [place] 10/17/2017, 3:45 PM

## 2017-10-17 NOTE — Progress Notes (Signed)
Short Note:  Neurology team spoke with patient and his wife in great length regarding the need for repeating an MRI brain due to possibility of brainstem strokes that can be negative in the first 24hrs.  After a long discussion, patient decided to stay to complete testing  MRV showed-  Negative for intracranial venous sinus thrombosis.  Dominant right IJ. Non dominant left IJ which might be occluded in the neck, uncertain, perhaps related to the left thoracic inlet lymphadenopathy demonstrated on CTA yesterday.  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 Repeat MRI will help determine etiology, stroke vs microvascular disease

## 2017-10-17 NOTE — Progress Notes (Signed)
Occupational Therapy Treatment Patient Details Name: Cody Lane MRN: [id number] DOB: [birth year] Today's Date: 10/17/2017    History of present illness 77 y.o. male past medical history of hypertension, hyperlipidemia,  congestive heart failure, aortic valve replacement, brain aneurysm, and atrial fibrillation on anticoagulation presents with gait imbalance on waking up on morning of 10/4 and visual deficits as well as vertigo.   OT comments  Pt continues with dysconjugate gaze, and diplopia, although he is unaware of the diplopia unless he is assessed directly.  When he is asked, he states "I don't know. I don't remember" Occlusion glasses appear to improve his balance and function.   He does require close min guard assist for functional mobility and transfers, and min A for tub transfers.  Reinforced to wife that pt needs direct assist when he is up/ambulating and provided her with a gait belt for use at home.  Wife reports pt with no cognitive or behavioral changes.   Upon discussion with her, she does indicate he has been having memory difficulties and attentional deficits PTA.  Continue to recommend [place].   Follow Up Recommendations  Supervision/Assistance - 24 hour;Outpatient OT    Equipment Recommendations  None recommended by OT    Recommendations for Other Services Speech consult    Precautions / Restrictions Precautions Precautions: Fall Precaution Comments: double vision, poor safety awareness        Mobility Bed Mobility Overal bed mobility: Needs Assistance Bed Mobility: Supine to Sit     Supine to sit: Supervision        Transfers Overall transfer level: Needs assistance Equipment used: Rolling walker (2 wheeled) Transfers: Sit to/from [facility] Sit to Stand: Supervision Stand pivot transfers: Min guard       General transfer comment: assist for safety     Balance Overall balance assessment: Needs assistance Sitting-balance support:  Feet supported Sitting balance-Leahy Scale: Good     Standing balance support: Single extremity supported Standing balance-Leahy Scale: Poor Standing balance comment: requires UE support and close min guard assist                            ADL either performed or assessed with clinical judgement   ADL Overall ADL's : Needs assistance/impaired     Grooming: Wash/dry hands;Wash/dry face;Oral care;Brushing hair;Standing;Supervision/safety   Upper Body Bathing: Supervision/ safety;Set up;Sitting   Lower Body Bathing: Min guard;Sit to/from stand Lower Body Bathing Details (indicate cue type and reason): wife does confirm pt has tub seat and sits to shower  Upper Body Dressing : Set up;Supervision/safety;Sitting   Lower Body Dressing: Min guard;Sit to/from stand   Toilet Transfer: Min guard;Ambulation;Comfort height toilet;Grab bars;RW   Toileting- Clothing Manipulation and Hygiene: Supervision/safety;Sit to/from stand   Tub/ Shower Transfer: Tub transfer;Minimal assistance;Ambulation;Shower seat;Rolling walker   Functional mobility during ADLs: Min guard;Rolling walker General ADL Comments: wife denies that pt has been using RW in hospital, but therapy notes indicate he has been.  Instructed her and pt to use RW at all times when at home, and that she needs to be with him at all times when he is walking due to imbalance.  She was provided with gait belt, and instruction how to use it.  Also reinforced that she assist him with shower transfer, and she states she feels she can do this.       Vision   Vision Assessment?: Yes Eye Alignment: Impaired (comment) Ocular Range of Motion:  Restricted on the left;Impaired-to be further tested in functional context Alignment/Gaze Preference: Head turned;Head tilt Tracking/Visual Pursuits: Left eye does not track medially;Decreased smoothness of horizontal tracking;Decreased smoothness of vertical tracking;Impaired - to be further  tested in functional context Convergence: Impaired (comment) Diplopia Assessment: Disappears with one eye closed;Only with right gaze Additional Comments: Wife reports pt has been wearing occlusion glasses.  Continue to recommend use of occlusion glasses.   Wife reports understanding of how to tape glasses and reports she has placed tape in her purse.   During binocular pursuits Lt eye does not track medially    Perception     Praxis      Cognition Arousal/Alertness: Awake/alert Behavior During Therapy: WFL for tasks assessed/performed Overall Cognitive Status: Impaired/Different from baseline Area of Impairment: Memory;Attention;Safety/judgement;Awareness;Problem solving                   Current Attention Level: Selective Memory: Decreased short-term memory   Safety/Judgement: Decreased awareness of deficits;Decreased awareness of safety Awareness: Intellectual Problem Solving: Slow processing;Difficulty sequencing;Requires verbal cues;Requires tactile cues General Comments: When asked if pt has diplopia, he states he is not sure.   Occlusion glasses removed and replaced to provide a comparison for him.  After this, he does endorse diplopia with Rt gaze .  Lt eye does not track medial with binocular pursuits and obvious dysconjugate gaze noted.   Pt frequently runs into items with RW and requires mod cues to problem solve and correct.   Spoke with wife re: baseline functioning.  She reports she has not noticed a change in his cognition of behaviors, and denies that he exhibited deficits, however, she later states, "you know, he seems to remember things 50 years ago better than he remembers things that happen now", and "I always have to tell him to pay attention, pay attention"         Exercises     Shoulder Instructions       General Comments Discussed addition of grab bars in bathroom.  Wife reports they are working with [facility] for modifications to new bathroom.  Discussed that  optimally a walk in shower would be best if they have that option     Pertinent Vitals/ Pain       Pain Assessment: No/denies pain  Home Living                                          Prior Functioning/Environment              Frequency  Min 3X/week        Progress Toward Goals  OT Goals(current goals can now be found in the care plan section)  Progress towards OT goals: Progressing toward goals     Plan Discharge plan remains appropriate    Co-evaluation                 AM-PAC PT "6 Clicks" Daily Activity     Outcome Measure   Help from another person eating meals?: None Help from another person taking care of personal grooming?: A Little Help from another person toileting, which includes using toliet, bedpan, or urinal?: A Little Help from another person bathing (including washing, rinsing, drying)?: A Little Help from another person to put on and taking off regular upper body clothing?: A Little Help from another person to put on and taking off regular  lower body clothing?: A Little 6 Click Score: 19    End of Session Equipment Utilized During Treatment: Gait belt  OT Visit Diagnosis: Unsteadiness on feet (R26.81);Muscle weakness (generalized) (M62.81);Other symptoms and signs involving cognitive function   Activity Tolerance Patient tolerated treatment well   Patient Left Other (comment)(with transporter for radiology )   Nurse Communication Mobility status        Time: 1561-5379 OT Time Calculation (min): 27 min  Charges: OT General Charges $OT Visit: 1 Visit OT Treatments $Self Care/Home Management : 23-37 mins  Lucille Passy, OTR/L [place] Pager [phone] Office [phone]    Lucille Passy M 10/17/2017, 1:45 PM

## 2017-10-17 NOTE — Progress Notes (Signed)
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  Neurology was consulted and recommended admission for observation.  Initial MRI was negative for CVA.  He had a CT angiogram of the head and neck which showed possibility of left proximal internal carotid artery dissection versus thrombus.  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 They declined that and prefer all the workup be done at the [place] where he gets most of his care.  -primary MD for the patient is Loney Hering, [place] or [phone]. 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 fib -Per prior notes and 16 patient had A. fib back then, and he was on Coumadin.  He does not  recall being on blood thinners ever.  -started on Coumadin, continue as he is not wishing to have his biopsy here  Hypertension -On amlodipine and metoprolol  Chronic systolic CHF on Lasix -has history of CHF, suspect chronic, 2D echo as below -he is euvolemic   History of bioprosthetic valve replacement   Scheduled Meds: . amLODipine  10 mg Oral Daily  . atorvastatin  20 mg Oral QHS  . carbamide peroxide  3-5 drop Left EAR BID  . cefdinir  300 mg Oral Q12H  . folic acid  1 mg Oral Daily  . gabapentin  400 mg Oral TID  . metoprolol tartrate  37.5 mg Oral BID  . multivitamin with minerals  1 tablet Oral QAC breakfast  . tamsulosin  0.4 mg Oral BID PC  . thiamine  100 mg Oral Daily   Or  . thiamine  100 mg Intravenous Daily  . warfarin  7.5 mg Oral ONCE-1800  . Warfarin - Pharmacist Dosing Inpatient   Does not apply q1800   Continuous Infusions: PRN Meds:.acetaminophen **OR** acetaminophen (TYLENOL) oral liquid 160 mg/5 mL **OR** acetaminophen, albuterol, fluticasone, hydroxypropyl methylcellulose / hypromellose, LORazepam **OR** LORazepam  DVT prophylaxis: Coumadin Code Status: Full code Family Communication: wife at bedside  Disposition Plan: To be determined  Consultants:   Neurology  Procedures:   2D echo:  Study Conclusions - Left ventricle: The cavity size was normal. There was moderate concentric hypertrophy. Systolic function was mildly reduced. The estimated ejection fraction was in the range of 45% to 50%. Wall motion was normal; there were no regional wall motion abnormalities. - Aortic valve: A bioprosthesis was present and functioning  normally. There was no regurgitation. Valve area (VTI): 3.39 cm^2. Valve area (Vmax): 2.99 cm^2. Valve area (Vmean): 2.92 cm^2. - Aorta: Ascending aortic diameter: 51 mm (S) at the sinus of Valsalva. - Ascending aorta: The ascending aorta was moderately dilated. - Mitral valve: There was mild regurgitation. - Left atrium: The  atrium was moderately dilated. Volume/bsa, S: 39.7 ml/m^2. - Pulmonary arteries: Systolic pressure was mildly increased. PA peak pressure: 33 mm Hg (S).   Antimicrobials:  None  Objective: Vitals:   10/17/17 0357 10/17/17 0739 10/17/17 1218 10/17/17 1617  BP: 94/80 100/88 108/87 118/81  Pulse: 80 91 88 64  Resp: 15 15 16 17   Temp: 98.4 F (36.9 C) 98.6 F (37 C) 98.7 F (37.1 C) 98.3 F (36.8 C)  TempSrc: Oral Oral Oral Oral  SpO2: 93% 91% 92% 96%  Weight:      Height:       No intake or output data in the 24 hours ending 10/17/17 1650 Filed Weights   10/15/17 2238  Weight: 83.2 kg    Examination:  Constitutional: NAD Eyes: no scleral icterus  ENMT: mmm Neck: normal, supple Respiratory: CTA biL, no wheezing, no crackles, good air movement.  Cardiovascular: irregular, soft 2/6 SEM, no edema, good pulses Abdomen: soft, NT, ND, BS+ Skin: no rashes seen Neurologic: left CN3 palsy. Strength equal Psychiatric: AxOx3   Data Reviewed: I have independently reviewed following labs and imaging studies   CBC: Recent Labs  Lab 10/15/17 1228 10/17/17 0803  WBC 8.7 7.5  HGB 14.3 13.7  HCT 44.0 43.2  MCV 94.4 94.5  PLT 279 742   Basic Metabolic Panel: Recent Labs  Lab 10/15/17 1228 10/16/17 0721  NA 143 138  K 4.2 3.8  CL 106 104  CO2 26 25  GLUCOSE 94 98  BUN 16 10  CREATININE 1.24 1.14  CALCIUM 9.2 8.7*   GFR: Estimated Creatinine Clearance: 57.8 mL/min (by C-G formula based on SCr of 1.14 mg/dL). Liver Function Tests: No results for input(s): AST, ALT, ALKPHOS, BILITOT, PROT, ALBUMIN in the last 168 hours. No results for input(s): LIPASE, AMYLASE in the last 168 hours. No results for input(s): AMMONIA in the last 168 hours. Coagulation Profile: Recent Labs  Lab 10/17/17 1037  INR 1.12   Cardiac Enzymes: No results for input(s): CKTOTAL, CKMB, CKMBINDEX, TROPONINI in the last 168 hours. BNP (last 3 results) No results for input(s): PROBNP in the  last 8760 hours. HbA1C: Recent Labs    10/16/17 0721  HGBA1C 5.2   CBG: Recent Labs  Lab 10/15/17 1139 10/16/17 2218  GLUCAP 84 102*   Lipid Profile: Recent Labs    10/16/17 0721  CHOL 68  HDL 34*  LDLCALC 23  TRIG 55  CHOLHDL 2.0   Thyroid Function Tests: No results for input(s): TSH, T4TOTAL, FREET4, T3FREE, THYROIDAB in the last 72 hours. Anemia Panel: No results for input(s): VITAMINB12, FOLATE, FERRITIN, TIBC, IRON, RETICCTPCT in the last 72 hours. Urine analysis:    Component Value Date/Time   COLORURINE YELLOW 10/15/2017 1850   APPEARANCEUR CLEAR 10/15/2017 1850   LABSPEC 1.032 (H) 10/15/2017 1850   PHURINE 7.0 10/15/2017 1850   GLUCOSEU NEGATIVE 10/15/2017 1850   HGBUR NEGATIVE 10/15/2017 1850   BILIRUBINUR NEGATIVE 10/15/2017 1850   KETONESUR NEGATIVE 10/15/2017 1850   PROTEINUR NEGATIVE 10/15/2017 1850   NITRITE NEGATIVE 10/15/2017 1850   LEUKOCYTESUR NEGATIVE 10/15/2017 1850   Sepsis Labs: Invalid input(s): PROCALCITONIN, LACTICIDVEN  No results found for this or any  previous visit (from the past 240 hour(s)).    Radiology Studies: Ct Angio Head W Or Wo Contrast  Result Date: 10/15/2017 CLINICAL DATA:  Diplopia. EXAM: CT ANGIOGRAPHY HEAD AND NECK TECHNIQUE: Multidetector CT imaging of the head and neck was performed using the standard protocol during bolus administration of intravenous contrast. Multiplanar CT image reconstructions and MIPs were obtained to evaluate the vascular anatomy. Carotid stenosis measurements (when applicable) are obtained utilizing NASCET criteria, using the distal internal carotid diameter as the denominator. CONTRAST:  11mL ISOVUE-370 IOPAMIDOL (ISOVUE-370) INJECTION 76% COMPARISON:  Head CT and MRI 10/15/2017 FINDINGS: CTA NECK FINDINGS Aortic arch: Standard 3 vessel aortic arch. Nonstenotic calcified plaque at the left subclavian artery origin. Right carotid system: Patent without evidence of stenosis or dissection.  Tortuous cervical ICA with partial retropharyngeal course. Left carotid system: Patent with minimal nonstenotic atherosclerotic plaque at the carotid bifurcation. Tortuous ICA. Subtle heterogeneous opacification of the proximal ICA with suggestion of a very thin curvilinear filling defect along the posterior wall beginning at the ICA origin and potentially extending over a length of 3 cm. Vertebral arteries: Patent with the left being strongly dominant. No evidence of dissection. Minimal nonstenotic plaque in the proximal left V1 segment. Marked diffuse hypoplasia of the right vertebral artery. Skeleton: Severe diffuse cervical disc degeneration. Other neck: Enlarged left supraclavicular lymph nodes measuring up to 2.9 x 2.0 cm. Upper chest: Mild mediastinal lymphadenopathy with a lower right paratracheal lymph node measuring 1.8 cm. Severe centrilobular emphysema. Review of the MIP images confirms the above findings CTA HEAD FINDINGS Anterior circulation: The internal carotid arteries are patent with mild nonstenotic atherosclerosis bilaterally. ACAs and MCAs are patent without evidence of proximal branch occlusion or significant stenosis. No aneurysm is identified. Posterior circulation: The intracranial left vertebral artery supplies the basilar. There is mild left V4 atherosclerosis without stenosis. The right vertebral artery terminates in PICA. Patent SCA origins are visualized bilaterally. The basilar artery is widely patent. Posterior communicating arteries are diminutive or absent. PCAs are patent without evidence of significant stenosis. No aneurysm is identified. Venous sinuses: As permitted by contrast timing, patent. Anatomic variants: Hypoplastic right vertebral artery ending in PICA. Delayed phase: No abnormal enhancement. Review of the MIP images confirms the above findings IMPRESSION: 1. Subtle curvilinear filling defect in the proximal left ICA which could reflect a thin strand of thrombus or  dissection flap. 2. No carotid or vertebral artery stenosis. 3. Patent circle of Willis without major branch occlusion or significant stenosis. 4. Left supraclavicular and mediastinal lymphadenopathy. Nonemergent chest CT is recommended to evaluate further as lung cancer is a consideration. 5. Aortic Atherosclerosis (ICD10-I70.0) and Emphysema (ICD10-J43.9). Electronically Signed   By: Logan Bores M.D.   On: 10/15/2017 18:48   Dg Chest 2 View  Result Date: 10/15/2017 CLINICAL DATA:  Dizziness. EXAM: CHEST - 2 VIEW COMPARISON:  None. FINDINGS: Patient is status post aortic valve replacement. There is prominence of the aorta with tortuosity. Aneurysm cannot be excluded on this study. No pneumothorax. No overt edema. Probable bleb in the lateral right mid lung. No focal infiltrate, nodule, or mass. The pulmonary arteries are prominent as well. IMPRESSION: 1. Tortuous prominent thoracic aorta with no comparison. Aneurysm is not excluded on this study. 2. Prominent pulmonary arteries raises the possibility of pulmonary arterial hypertension. 3. No acute abnormalities otherwise seen. Electronically Signed   By: Dorise Bullion III M.D   On: 10/15/2017 22:12   Ct Angio Neck W And/or Wo Contrast  Result Date:  10/15/2017 CLINICAL DATA:  Diplopia. EXAM: CT ANGIOGRAPHY HEAD AND NECK TECHNIQUE: Multidetector CT imaging of the head and neck was performed using the standard protocol during bolus administration of intravenous contrast. Multiplanar CT image reconstructions and MIPs were obtained to evaluate the vascular anatomy. Carotid stenosis measurements (when applicable) are obtained utilizing NASCET criteria, using the distal internal carotid diameter as the denominator. CONTRAST:  111mL ISOVUE-370 IOPAMIDOL (ISOVUE-370) INJECTION 76% COMPARISON:  Head CT and MRI 10/15/2017 FINDINGS: CTA NECK FINDINGS Aortic arch: Standard 3 vessel aortic arch. Nonstenotic calcified plaque at the left subclavian artery origin. Right  carotid system: Patent without evidence of stenosis or dissection. Tortuous cervical ICA with partial retropharyngeal course. Left carotid system: Patent with minimal nonstenotic atherosclerotic plaque at the carotid bifurcation. Tortuous ICA. Subtle heterogeneous opacification of the proximal ICA with suggestion of a very thin curvilinear filling defect along the posterior wall beginning at the ICA origin and potentially extending over a length of 3 cm. Vertebral arteries: Patent with the left being strongly dominant. No evidence of dissection. Minimal nonstenotic plaque in the proximal left V1 segment. Marked diffuse hypoplasia of the right vertebral artery. Skeleton: Severe diffuse cervical disc degeneration. Other neck: Enlarged left supraclavicular lymph nodes measuring up to 2.9 x 2.0 cm. Upper chest: Mild mediastinal lymphadenopathy with a lower right paratracheal lymph node measuring 1.8 cm. Severe centrilobular emphysema. Review of the MIP images confirms the above findings CTA HEAD FINDINGS Anterior circulation: The internal carotid arteries are patent with mild nonstenotic atherosclerosis bilaterally. ACAs and MCAs are patent without evidence of proximal branch occlusion or significant stenosis. No aneurysm is identified. Posterior circulation: The intracranial left vertebral artery supplies the basilar. There is mild left V4 atherosclerosis without stenosis. The right vertebral artery terminates in PICA. Patent SCA origins are visualized bilaterally. The basilar artery is widely patent. Posterior communicating arteries are diminutive or absent. PCAs are patent without evidence of significant stenosis. No aneurysm is identified. Venous sinuses: As permitted by contrast timing, patent. Anatomic variants: Hypoplastic right vertebral artery ending in PICA. Delayed phase: No abnormal enhancement. Review of the MIP images confirms the above findings IMPRESSION: 1. Subtle curvilinear filling defect in the  proximal left ICA which could reflect a thin strand of thrombus or dissection flap. 2. No carotid or vertebral artery stenosis. 3. Patent circle of Willis without major branch occlusion or significant stenosis. 4. Left supraclavicular and mediastinal lymphadenopathy. Nonemergent chest CT is recommended to evaluate further as lung cancer is a consideration. 5. Aortic Atherosclerosis (ICD10-I70.0) and Emphysema (ICD10-J43.9). Electronically Signed   By: Logan Bores M.D.   On: 10/15/2017 18:48   Ct Chest W Contrast  Result Date: 10/17/2017 CLINICAL DATA:  Thoracic adenopathy.  Inpatient. EXAM: CT CHEST WITH CONTRAST TECHNIQUE: Multidetector CT imaging of the chest was performed during intravenous contrast administration. CONTRAST:  73mL OMNIPAQUE IOHEXOL 350 MG/ML SOLN COMPARISON:  10/15/2017 chest radiograph. FINDINGS: Cardiovascular: Mild cardiomegaly. No significant pericardial effusion/thickening. Aortic valvular prosthesis is in place. Atherosclerotic thoracic aorta with 5.0 cm ascending thoracic aortic aneurysm. Dilated main pulmonary artery (4.0 cm diameter). No central pulmonary emboli. Mediastinum/Nodes: No discrete thyroid nodules. Small amount of oral contrast layering in the thoracic esophagus. No pathologically enlarged axillary nodes. Enlarged 2.0 cm left supraclavicular node (series 3/image 26). Right paratracheal adenopathy measuring up to 2.2 cm (series 3/image 71). Enlarged 2.0 cm subcarinal node (series 3/image 84). Enlarged 1.2 cm AP window node (series 3/image 84). Enlarged 2.3 cm right hilar node (series 3/image 83). Enlarged 1.5 cm left  hilar node (series 3/image 87). Lungs/Pleura: No pneumothorax. No pleural effusion. Central left lower lobe 4.5 x 3.0 cm lung mass with associated mild extrinsic narrowing of the distal left mainstem bronchus and proximal left upper and left lower lobe airways. Severe centrilobular and paraseptal emphysema with mild diffuse bronchial wall thickening. Several  solid pulmonary nodules scattered in both lungs, largest 8 mm in the right lower lobe (series 7/image 99). Upper abdomen: No acute abnormality. Musculoskeletal: No aggressive appearing focal osseous lesions. Symmetric mild gynecomastia. Intact sternotomy wires. Marked thoracic spondylosis. IMPRESSION: 1. Central left lower lobe 4.5 cm lung mass with associated mild extrinsic narrowing of the distal left mainstem bronchus and proximal left upper and left lower lobe airways. Primary bronchogenic carcinoma is the diagnosis of exclusion. 2. Several subcentimeter solid pulmonary nodules scattered in both lungs, suspicious for pulmonary metastases. 3. Bilateral hilar, subcarinal, right paratracheal, AP window and left supraclavicular adenopathy suspicious for metastatic disease. 4. Multidisciplinary thoracic oncology consultation suggested. 5. Dilated main pulmonary artery, suggesting pulmonary arterial hypertension. 6. Mild cardiomegaly. 7. 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 Ascending thoracic aortic aneurysm. Recommend semi-annual imaging followup by CTA or MRA and referral to cardiothoracic surgery if not already obtained. This recommendation follows 2010 ACCF/AHA/AATS/ACR/ASA/SCA/SCAI/SIR/STS/SVM Guidelines for the Diagnosis and Management of Patients With Thoracic Aortic Disease. Circulation. 2010; 121: U440-H474. Aortic Atherosclerosis (ICD10-I70.0) and Emphysema (ICD10-J43.9). Electronically Signed   By: Ilona Sorrel M.D.   On: 10/17/2017 11:59   Mr Mrv Head Wo Cm  Result Date: 10/17/2017 CLINICAL DATA:  77 year old male with dizziness and diplopia. EXAM: MR VENOGRAM the HEAD WITHOUT CONTRAST TECHNIQUE: Angiographic images of the intracranial venous structures were obtained using MRV technique without intravenous contrast. COMPARISON:  Brain MRI, CTA head and neck 10/15/2017. FINDINGS: 3D time-of-flight technique utilized. Preserved flow signal in the superior sagittal sinus, the torcula, the straight  sinus, the vein of Galen, the internal cerebral veins, and basal veins of Rosenthal. Preserved flow signal in the dominant right transverse and sigmoid, and non dominant left transverse and sigmoid sinuses. Preserved flow signal in the dominant right IJ bulb. The non dominant left IJ is diminutive and poorly visible on the CTA of the neck yesterday, and might be occluded due to left thoracic inlet lymphadenopathy, uncertain. IMPRESSION: Negative for intracranial venous sinus thrombosis. Dominant right IJ. Non dominant left IJ which might be occluded in the neck, uncertain, perhaps related to the left thoracic inlet lymphadenopathy demonstrated on CTA yesterday. Electronically Signed   By: Genevie Ann M.D.   On: 10/17/2017 10:15   Time spent: 45 minutes, more than 50% bedside in 2 separate visits, discussed CT chest findings with the family, potential significance and possibility of biopsy as detailed above.   Marzetta Board, MD, PhD [facility] Pager [phone]  If 7PM-7AM, please contact night-coverage [email or web address] Password [email or web address] 10/17/2017, 4:50 PM

## 2017-10-17 NOTE — Progress Notes (Addendum)
ANTICOAGULATION CONSULT NOTE - Initial Consult  Pharmacy Consult for warfarin Indication: atrial fibrillation  No Known Allergies  Patient Measurements: Height: 5\' 11"  (180.3 cm) Weight: 183 lb 6.8 oz (83.2 kg) IBW/kg (Calculated) : 75.3 Heparin Dosing Weight: 83.2  Vital Signs: Temp: 98.6 F (37 C) (10/06 0739) Temp Source: Oral (10/06 0739) BP: 100/88 (10/06 0739) Pulse Rate: 91 (10/06 0739)  Labs: Recent Labs    10/15/17 1228 10/16/17 0721 10/17/17 0803 10/17/17 1037  HGB 14.3  --  13.7  --   HCT 44.0  --  43.2  --   PLT 279  --  233  --   LABPROT  --   --   --  14.3  INR  --   --   --  1.12  CREATININE 1.24 1.14  --   --     Estimated Creatinine Clearance: 57.8 mL/min (by C-G formula based on SCr of 1.14 mg/dL).   Medical History: Past Medical History:  Diagnosis Date  . Aortic valve regurgitation 01/26/14  . Cataract 08/17/2013  . 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 CHF (congestive heart failure) ([place]) 01/26/2014  . Hypertension   . Mitral valve regurgitation 01/26/2014  . Pseudophakia 08/17/2013  . Vitamin D deficiency     Medications:    Assessment: 77 yo M with presented with double vision/ gait imbalance and found to be in a Afib, not on anticoagulation PTA. Marland Kitchen Pt was briefly started on heparin for Afib but pharmacy consult changed to warfarin.  First dose 10/5.   No bleeding documented. CBC WNL. Today's INR 1.12   Goal of Therapy:  INR 2-3 Monitor platelets by anticoagulation protocol: Yes   Plan:  Warfarin 7.5mg  x1 Daily INR, monitor for s/s bleeding  Harrietta Guardian, PharmD PGY1 Pharmacy Resident 10/17/2017    11:54 AM

## 2017-10-18 ENCOUNTER — Inpatient Hospital Stay (HOSPITAL_COMMUNITY): Payer: Medicare Other

## 2017-10-18 DIAGNOSIS — I480 Paroxysmal atrial fibrillation: Secondary | ICD-10-CM

## 2017-10-18 DIAGNOSIS — I6312 Cerebral infarction due to embolism of basilar artery: Secondary | ICD-10-CM

## 2017-10-18 DIAGNOSIS — I639 Cerebral infarction, unspecified: Secondary | ICD-10-CM

## 2017-10-18 DIAGNOSIS — I5022 Chronic systolic (congestive) heart failure: Secondary | ICD-10-CM

## 2017-10-18 LAB — CBC
HCT: 40.5 % (ref 39.0–52.0)
Hemoglobin: 12.6 g/dL — ABNORMAL LOW (ref 13.0–17.0)
MCH: 29.9 pg (ref 26.0–34.0)
MCHC: 31.1 g/dL (ref 30.0–36.0)
MCV: 96 fL (ref 78.0–100.0)
PLATELETS: 238 10*3/uL (ref 150–400)
RBC: 4.22 MIL/uL (ref 4.22–5.81)
RDW: 11.6 % (ref 11.5–15.5)
WBC: 7.2 10*3/uL (ref 4.0–10.5)

## 2017-10-18 LAB — PROTIME-INR
INR: 1.18
PROTHROMBIN TIME: 14.9 s (ref 11.4–15.2)

## 2017-10-18 MED ORDER — ATORVASTATIN CALCIUM 10 MG PO TABS: 10.0000 mg | ORAL_TABLET | Freq: Every day | ORAL | Status: DC

## 2017-10-18 MED ORDER — ATORVASTATIN CALCIUM 10 MG PO TABS: 10.0000 mg | ORAL_TABLET | Freq: Every day | ORAL | 0 refills | Status: DC

## 2017-10-18 MED ORDER — GADOBUTROL 1 MMOL/ML IV SOLN
8.0000 mL | Freq: Once | INTRAVENOUS | Status: AC | PRN
  Administered 2017-10-18: 7.5 mL via INTRAVENOUS

## 2017-10-18 MED ORDER — STROKE: EARLY STAGES OF RECOVERY BOOK
Freq: Once | Status: AC
  Administered 2017-10-18: 12:00:00

## 2017-10-18 MED ORDER — WARFARIN SODIUM 7.5 MG PO TABS: 7.5000 mg | ORAL_TABLET | Freq: Once | ORAL | Status: DC

## 2017-10-18 MED ORDER — APIXABAN 5 MG PO TABS: 5.0000 mg | ORAL_TABLET | Freq: Two times a day (BID) | ORAL | Status: DC

## 2017-10-18 MED ORDER — APIXABAN 5 MG PO TABS: 5.0000 mg | ORAL_TABLET | Freq: Two times a day (BID) | ORAL | 1 refills | Status: DC

## 2017-10-18 MED ORDER — APIXABAN 5 MG PO TABS: 5.0000 mg | ORAL_TABLET | Freq: Two times a day (BID) | ORAL | 1 refills | Status: AC

## 2017-10-18 MED ORDER — METOPROLOL TARTRATE 25 MG PO TABS: 25.0000 mg | ORAL_TABLET | Freq: Two times a day (BID) | ORAL | Status: DC

## 2017-10-18 NOTE — Progress Notes (Signed)
ANTICOAGULATION CONSULT NOTE - Initial Consult  Pharmacy Consult for Eliquis Indication: Afib + new CVA  No Known Allergies  Patient Measurements: Height: 5\' 11"  (180.3 cm) Weight: 183 lb 6.8 oz (83.2 kg) IBW/kg (Calculated) : 75.3  Vital Signs: Temp: 98.3 F (36.8 C) (10/07 0735) Temp Source: Oral (10/07 0735) BP: 106/85 (10/07 1030) Pulse Rate: 78 (10/07 1030)  Labs: Recent Labs    10/16/17 0721 10/17/17 0803 10/17/17 1037 10/18/17 0637  HGB  --  13.7  --  12.6*  HCT  --  43.2  --  40.5  PLT  --  233  --  238  LABPROT  --   --  14.3 14.9  INR  --   --  1.12 1.18  CREATININE 1.14  --   --   --     Estimated Creatinine Clearance: 57.8 mL/min (by C-G formula based on SCr of 1.14 mg/dL).   Medical History: Past Medical History:  Diagnosis Date  . Aortic valve regurgitation 01/26/14  . Cataract 08/17/2013  . 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 CHF (congestive heart failure) ([place]) 01/26/2014  . Hypertension   . Mitral valve regurgitation 01/26/2014  . Pseudophakia 08/17/2013  . Vitamin D deficiency    Assessment:  Anticoag:Warfarin>Eliquis for afib with new CVA. CBC WNL - 77 y/o, 83kg, Scr 1.14, CrCl 57  Goal of Therapy:   Therapeutic oral anticoagulation   Plan:  D/c Warfarin Change to Eliquis 5mg  BID. Counsel may need to hold around lung [facility]   Maryela Tapper S. Alford Highland, PharmD, BCPS Clinical Staff Pharmacist  Eilene Ghazi Stillinger 10/18/2017,1:26 PM

## 2017-10-18 NOTE — Progress Notes (Signed)
ANTICOAGULATION CONSULT NOTE - Initial Consult  Pharmacy Consult for warfarin Indication: atrial fibrillation  No Known Allergies  Patient Measurements: Height: 5\' 11"  (180.3 cm) Weight: 183 lb 6.8 oz (83.2 kg) IBW/kg (Calculated) : 75.3   Vital Signs: Temp: 98.3 F (36.8 C) (10/07 0735) Temp Source: Oral (10/07 0735) BP: 115/90 (10/07 0735) Pulse Rate: 69 (10/07 0735)  Labs: Recent Labs    10/15/17 1228 10/16/17 0721 10/17/17 0803 10/17/17 1037 10/18/17 0637  HGB 14.3  --  13.7  --  12.6*  HCT 44.0  --  43.2  --  40.5  PLT 279  --  233  --  238  LABPROT  --   --   --  14.3 14.9  INR  --   --   --  1.12 1.18  CREATININE 1.24 1.14  --   --   --     Estimated Creatinine Clearance: 57.8 mL/min (by C-G formula based on SCr of 1.14 mg/dL).   Medical History: Past Medical History:  Diagnosis Date  . Aortic valve regurgitation 01/26/14  . Cataract 08/17/2013  . 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 CHF (congestive heart failure) ([place]) 01/26/2014  . Hypertension   . Mitral valve regurgitation 01/26/2014  . Pseudophakia 08/17/2013  . Vitamin D deficiency     Medications:    Assessment: 77 yo M with presented with double vision/ gait imbalance and found to be in a Afib, not on anticoagulation PTA. Marland Kitchen Pt was briefly started on heparin for Afib but pharmacy consult changed to warfarin.  First dose 10/5.   INR subtherapeutic at 1.18 on day 3 s/p initiation of therapy on 10/5.  CBC stable, no bleeding reported at this time.  Goal of Therapy:  INR 2-3 Monitor platelets by anticoagulation protocol: Yes   Plan:  Warfarin 7.5mg  PO x1 tonight Daily INR, CBC, s/s bleeding  Bertis Ruddy, PharmD Clinical Pharmacist Please check AMION for all [place] numbers 10/18/2017 9:57 AM

## 2017-10-18 NOTE — Discharge Instructions (Signed)
Follow with [facility], [facility] in 1 week  Please get a complete blood count and chemistry panel checked by your Primary MD at your next visit, and again as instructed by your Primary MD. Please get your medications reviewed and adjusted by your Primary MD.  Please request your Primary MD to go over all Hospital Tests and Procedure/Radiological results at the follow up, please get all Hospital records sent to your Prim MD by signing hospital release before you go home.  If you had Pneumonia of Lung problems at the Hospital: Please get a 2 view Chest X ray done in 6-8 weeks after hospital discharge or sooner if instructed by your Primary MD.  If you have Congestive Heart Failure: Please call your Cardiologist or Primary MD anytime you have any of the following symptoms:  1) 3 pound weight gain in 24 hours or 5 pounds in 1 week  2) shortness of breath, with or without a dry hacking cough  3) swelling in the hands, feet or stomach  4) if you have to sleep on extra pillows at night in order to breathe  Follow cardiac low salt diet and 1.5 lit/day fluid restriction.  If you have diabetes Accuchecks 4 times/day, Once in AM empty stomach and then before each meal. Log in all results and show them to your primary doctor at your next visit. If any glucose reading is under 80 or above 300 call your primary MD immediately.  If you have Seizure/Convulsions/Epilepsy: Please do not drive, operate heavy machinery, participate in activities at heights or participate in high speed sports until you have seen by Primary MD or a Neurologist and advised to do so again.  If you had Gastrointestinal Bleeding: Please ask your Primary MD to check a complete blood count within one week of discharge or at your next visit. Your endoscopic/colonoscopic biopsies that are pending at the time of discharge, will also need to followed by your Primary MD.  Get Medicines reviewed and adjusted. Please take all your  medications with you for your next visit with your Primary MD  Please request your Primary MD to go over all hospital tests and procedure/radiological results at the follow up, please ask your Primary MD to get all Hospital records sent to his/her office.  If you experience worsening of your admission symptoms, develop shortness of breath, life threatening emergency, suicidal or homicidal thoughts you must seek medical attention immediately by calling 911 or calling your MD immediately  if symptoms less severe.  You must read complete instructions/literature along with all the possible adverse reactions/side effects for all the Medicines you take and that have been prescribed to you. Take any new Medicines after you have completely understood and accpet all the possible adverse reactions/side effects.   Do not drive or operate heavy machinery when taking Pain medications.   Do not take more than prescribed Pain, Sleep and Anxiety Medications  Special Instructions: If you have smoked or chewed Tobacco  in the last 2 yrs please stop smoking, stop any regular Alcohol  and or any Recreational drug use.  Wear Seat belts while driving.  Please note You were cared for by a hospitalist during your hospital stay. If you have any questions about your discharge medications or the care you received while you were in the hospital after you are discharged, you can call the unit and asked to speak with the hospitalist on call if the hospitalist that took care of you is not available. Once  you are discharged, your primary care physician will handle any further medical issues. Please note that NO REFILLS for any discharge medications will be authorized once you are discharged, as it is imperative that you return to your primary care physician (or establish a relationship with a primary care physician if you do not have one) for your aftercare needs so that they can reassess your need for medications and monitor your  lab values.  You can reach the hospitalist office at phone [phone] or fax [phone]   If you do not have a primary care physician, you can call [phone] for a physician referral.  Activity: As tolerated with Full fall precautions use walker/cane & assistance as needed  Diet: heart healthy  Disposition Home

## 2017-10-18 NOTE — Care Management Note (Addendum)
Case Management Note  Patient Details  Name: Cody Lane MRN: [id number] Date of Birth: [birth year]  Subjective/Objective:   Pt admitted with a CVA. He is from home with his spouse. Pt sees Dr Ishmael Holter as his PcP through [place], [place] at [facility]. Wife denies any transportation issues. Pt obtains him medications through the [place].               Action/Plan: Recommendations are for Outpatient therapy. CM met with the patient and his spouse and they would like to attend [facility]. Orders placed in Epic and information on the AVS.  W[facility] concerned about [facility] getting information needed post d/c. CM assisted in filling out Release of Information form and faxed if to Medical records. Wife also has a copy so she can obtain records after d/c as needed.  Wife can provide 24 hour supervision at home and transportation to home.  Addendum: pt started on Eliquis. Pt and wife provided the 30 day free card. Wife to f/u with appt at the [place].  Expected Discharge Date:                  Expected Discharge Plan:  OP Rehab  In-House Referral:     Discharge planning Services  CM Consult  Post Acute Care Choice:    Choice offered to:     DME Arranged:    DME Agency:     HH Arranged:    [place] Agency:     Status of Service:  Completed, signed off  If discussed at [employer] of Stay Meetings, dates discussed:    Additional Comments:  Pollie Friar, RN 10/18/2017, 11:45 AM

## 2017-10-18 NOTE — Progress Notes (Signed)
STROKE TEAM PROGRESS NOTE   SUBJECTIVE (INTERVAL HISTORY) Wife at bedside. Pt is eating lunch in bed. He still has diplopia, not changed from yesterday. On exam showed left INO. MRI showed dorsal midbrain small infarct. Family and pt now agree with DOACs for the PAF. She needs to schedule early appointment with [place] PCP.    OBJECTIVE Vitals:   10/18/17 0050 10/18/17 0405 10/18/17 0735 10/18/17 1030  BP: 106/85 95/67 115/90 106/85  Pulse: 85 88 69 78  Resp: 18 17 18 18   Temp: 99.1 F (37.3 C) 98.1 F (36.7 C) 98.3 F (36.8 C)   TempSrc: Oral Oral Oral   SpO2: 94% 92% 90%   Weight:      Height:        CBC:  Recent Labs  Lab 10/17/17 0803 10/18/17 0637  WBC 7.5 7.2  HGB 13.7 12.6*  HCT 43.2 40.5  MCV 94.5 96.0  PLT 233 614    Basic Metabolic Panel:  Recent Labs  Lab 10/15/17 1228 10/16/17 0721  NA 143 138  K 4.2 3.8  CL 106 104  CO2 26 25  GLUCOSE 94 98  BUN 16 10  CREATININE 1.24 1.14  CALCIUM 9.2 8.7*    Lipid Panel:     Component Value Date/Time   CHOL 68 10/16/2017 0721   TRIG 55 10/16/2017 0721   HDL 34 (L) 10/16/2017 0721   CHOLHDL 2.0 10/16/2017 0721   VLDL 11 10/16/2017 0721   LDLCALC 23 10/16/2017 0721   HgbA1c:  Lab Results  Component Value Date   HGBA1C 5.2 10/16/2017   Urine Drug Screen: No results found for: LABOPIA, COCAINSCRNUR, LABBENZ, AMPHETMU, THCU, LABBARB  Alcohol Level No results found for: ETH    IMAGING  Ct Angio Head W Or Wo Contrast Ct Angio Neck W And/or Wo Contrast 10/15/2017 IMPRESSION:  1. Subtle curvilinear filling defect in the proximal left ICA which could reflect a thin strand of thrombus or dissection flap.  2. No carotid or vertebral artery stenosis.  3. Patent circle of Willis without major branch occlusion or significant stenosis.  4. Left supraclavicular and mediastinal lymphadenopathy. Nonemergent chest CT is recommended to evaluate further as lung cancer is a consideration.  5. Aortic Atherosclerosis  (ICD10-I70.0) and Emphysema (ICD10-J43.9).   Ct Head Wo Contrast 10/15/2017 IMPRESSION:  Atrophy with small vessel chronic ischemic changes of deep cerebral white matter.  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  No acute intracranial abnormalities.    [name] 10/15/2017 IMPRESSION:  Negative for acute infarct. Moderate atrophy and moderate chronic microvascular ischemia.    Dg Chest 2 View 10/15/2017 IMPRESSION:  1. Tortuous prominent thoracic aorta with no comparison. Aneurysm is not excluded on this study.  2. Prominent pulmonary arteries raises the possibility of pulmonary arterial hypertension.  3. No acute abnormalities otherwise seen.    Mr Jeri Cos Wo Contrast Result Date: 10/18/2017 CLINICAL DATA:  Recently diagnosed malignancy.  Dizziness. EXAM: MRI HEAD WITHOUT AND WITH CONTRAST TECHNIQUE: Multiplanar, multiecho pulse sequences of the brain and surrounding structures were obtained without and with intravenous contrast. CONTRAST:  7.5 cc Gadavist intravenous COMPARISON:  10/15/2017 FINDINGS: Brain: Acute lacunar infarct in the midbrain located just anterior to the cerebral aqua duct. There is a background of chronic small vessel ischemia with gliosis in the cerebral white matter and pons. Remote lacunar infarcts in the left centrum semiovale and left thalamus. Multiple remote micro hemorrhages seen both along the hemispheres and more extensively in the  deep left cerebellum and deep gray nuclei. No acute hemorrhage, hydrocephalus, or collection. Vascular: Major flow voids and vascular enhancements are preserved. Skull and upper cervical spine: No evidence of marrow lesion Sinuses/Orbits: Bilateral cataract resection.  Tornwaldt cyst IMPRESSION: 1. Acute lacunar infarct in the midbrain. 2. Chronic small vessel ischemia. 3. Remote micro hemorrhages, likely hypertensive. Electronically Signed   By: Monte Fantasia M.D.   On: 10/18/2017 03:15    Transthoracic  Echocardiogram  - Left ventricle: The cavity size was normal. There was moderate   concentric hypertrophy. Systolic function was mildly reduced. The   estimated ejection fraction was in the range of 45% to 50%. Wall   motion was normal; there were no regional wall motion   abnormalities. - Aortic valve: A bioprosthesis was present and functioning   normally. There was no regurgitation. Valve area (VTI): 3.39   cm^2. Valve area (Vmax): 2.99 cm^2. Valve area (Vmean): 2.92   cm^2. - Aorta: Ascending aortic diameter: 51 mm (S) at the sinus of   Valsalva. - Ascending aorta: The ascending aorta was moderately dilated. - Mitral valve: There was mild regurgitation. - Left atrium: The atrium was moderately dilated. Volume/bsa, S:   39.7 ml/m^2. - Pulmonary arteries: Systolic pressure was mildly increased. PA   peak pressure: 33 mm Hg (S).     PHYSICAL EXAM Blood pressure 106/85, pulse 78, temperature 98.3 F (36.8 C), temperature source Oral, resp. rate 18, height 5\' 11"  (1.803 m), weight 83.2 kg, SpO2 90 %.  General: Appears well-developed  Psych: Affect appropriate to situation Eyes: No scleral injection HENT: No OP obstrucion Head: Normocephalic.  Cardiovascular: Normal rate and regular rhythm, not in afib.  Respiratory: Effort normal and breath sounds normal to anterior ascultation GI: Soft. No distension. There is no tenderness.  Skin: WDI   Neurological Examination Mental Status: Alert, oriented, thought content appropriate.  Speech fluent without evidence of aphasia. Able to follow 3 step commands without difficulty. Cranial Nerves: II: Visual fields grossly normal,  III,IV, VI: ptosis not present, left eye unable to adduct, consistent with INO, round, reactive to light and accommodation V,VII: smile symmetric, facial light touch sensation normal bilaterally VIII: hearing normal bilaterally IX,X: uvula rises symmetrically XI: bilateral shoulder shrug XII: midline  tongue extension Motor: Right :  Upper extremity   5/5                                      Left:     Upper extremity   5/5             Lower extremity   5/5                                                  Lower extremity   5/5 Tone and bulk:normal tone throughout; no atrophy noted Sensory: Pinprick and light touch intact throughout, bilaterally Deep Tendon Reflexes: 2+ and symmetric throughout Plantars: Right: downgoing                                Left: downgoing Cerebellar: normal finger-to-nose, normal rapid alternating movements  Gait: normal gait and station    ASSESSMENT/PLAN [name] is a 77 y.o.  male with history of hypertension, hyperlipidemia,  congestive heart failure, aortic valve replacement, brain aneurysm, and atrial fibrillation not on anticoagulation presenting with gait disturbance and third nerve palsy. He did not receive IV t-PA due to late presentation.  Stroke - left dorsal lower midbrain small infarct - likely due to PAF not on AC vs. Small vessel diseaswe  CT head - No acute intracranial abnormalities. 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   MRI head 10/17/17 - Negative for acute infarct.  MRI brain 10/18/17 - left dorsal lower midbrain small infarct  CTA H&N -  Subtle curvilinear filling defect in the proximal left ICA which could reflect a thin strand of thrombus or dissection flap.   2D Echo - EF 45-50%  LDL - 23  HgbA1c - 5.2  VTE prophylaxis - IV Heparin  Diet  - Heart healthy with thin liquids.  No antithrombotic prior to admission, now coumadin. Discussed with pt and wife, they agree with DOACs for anticoagulation. They will schedule early appointment with [place] PCP to continue prescribe either Xarelto or Eliquis.   Patient counseled to be compliant with his antithrombotic medications  Ongoing aggressive stroke risk factor management  Therapy recommendations:  outpt PT  Disposition:  Pending  PAF  Diagnosed before  2016, was on coumadin but not sure why not continued - likely noncompliance  Currently not in afib and rate controlled  Discussed with pt and wife, they agree with DOACs for anticoagulation. They will schedule early appointment with [place] PCP to continue prescribe either Xarelto or Eliquis.   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 . D/c norvasc and lower metoprolol to 25mg  bid . Long-term BP goal normotensive  Hyperlipidemia  Lipid lowering medication PTA:  Lipitor 20 mg daily  LDL 23, goal < 70  Decrease Lipitor to 10 mg daily  Continue statin at discharge  Left ICA curvilinear filling defect  CTA findings of faint left proximal ICA strand like structure  Asymptomatic at this time  Old dissection vs. Carotid web vs. Artifact  No need intervention at this time  Will need repeat CTA neck in about 4 weeks  Other Stroke Risk Factors  Advanced age  Former cigarette smoker - quit  Alcohol use PTA - on FA/B1/MVI - recommend to limit 2 drinks per day  Hx stroke/TIA by imaging  Family hx stroke (father)  Hospital day # 2  Neurology will sign off. Please call with questions. [facility] in about 2-3 weeks. Thanks for the consult.  Rosalin Hawking, MD PhD Stroke Neurology 10/18/2017 1:04 PM    To contact Stroke Continuity provider, please refer to [email or web address] After hours, contact General Neurology

## 2017-10-18 NOTE — Discharge Summary (Signed)
Physician Discharge Summary  Cody Lane JJO:[id number] DOB: 1940/02/27 DOA: 10/15/2017  PCP: [name]  Admit date: 10/15/2017 Discharge date: 10/18/2017  Admitted From: Home Disposition: Home  Recommendations for Outpatient Follow-up:  1. Follow up with PCP in 1-2 weeks 2. Please obtain BMP/CBC in one week 3. Please follow up on the following pending results:  Home Health: none Equipment/Devices: none  Discharge Condition: stable CODE STATUS: Full code Diet recommendation: heart healthy  HPI: Per Dr. Glyn Ade, Cody Lane is a 77 y.o. male with history of hypertension, hyperlipidemia, cerebral aneurysm, prosthetic aortic valve replacement previous history of tobacco abuse presents to the ER at [facility] with complaints of being having diplopia since morning with dizziness.  Patient has been having some left-sided earache for last 2 days and had gone to urgent care center and was told she had some wax in the ER and had been given some drops and antibiotics.  The left ear ache persists.  And since this morning patient noticed the above symptoms.  Denies any weakness of upper or lower extremity denies any difficulty speaking or swallowing.  Hospital Course: Acute CVA /diplopia -patient was admitted to the hospital with diplopia of sudden onset as well as dizziness.  Neurology was consulted and followed patient while hospitalized.  While initial MRI did not show any evidence of acute CVA, due to persistent symptoms and high clinical suspicion he underwent a repeat MRI at 48 hours which did show left dorsal lower midbrain small infarct, likely due to paroxysmal A. fib not on anticoagulation versus small vessel disease.  CT angiogram of the head and neck showed small filling defect in the proximal left ICA possibly thrombus versus small dissection flap.  This was managed conservatively, but will need to be followed up with a repeat CT angiogram in 4 weeks.  He  underwent 2D echo which showed an EF of 45 to 50%.  His LDL was found to be low at 23, he is on a statin, his A1c was 5.2.  He was placed on anticoagulation Eliquis for coexisting PAF.  Due to diplopia he was advised not to drive until cleared by ophthalmology or neurology Paroxsymal atrial fibrillation - patient's CHA2DS2-VASc Score for Stroke Risk is >2, he does have a history of atrial fibrillation diagnosed as far back as 2016, and at that time he was on Coumadin.  It is unclear as to why he is not on anticoagulation right now.  He was placed on Eliquis.  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  I have discussed these findings over the phone personally with her PCP, Dr. Maylon Cos from [facility] [phone], [place] or extension [phone]).  She will follow-up and arrange the next steps Chronic systolic CHF -euvolemic, continue Lasix  Discharge Diagnoses:  Principal Problem:   Vertigo Active Problems:   Aortic valve insufficiency S/P aortic valve replacement   Essential hypertension   Unspecified atrial fibrillation (HCC)   CHF (congestive heart failure) [facility])   Diplopia     Discharge Instructions  Discharge Instructions    Ambulatory referral to Occupational Therapy   Complete by:  As directed    Ambulatory referral to Physical Therapy   Complete by:  As directed      Allergies as of 10/18/2017   No Known Allergies     Medication List    STOP taking these medications   amLODipine 10 MG tablet Commonly known as:  NORVASC     TAKE these medications     albuterol 108 (90 Base) MCG/ACT inhaler Commonly known as:  PROVENTIL HFA;VENTOLIN HFA Inhale 2 puffs into the lungs every 6 (six) hours as needed for wheezing or shortness of breath.   apixaban 5 MG Tabs tablet Commonly known as:  ELIQUIS Take 1 tablet (5 mg total) by mouth 2 (two) times daily.   atorvastatin 10 MG tablet Commonly known as:  LIPITOR Take 1 tablet (10 mg total) by mouth at bedtime. What changed:    medication strength  how much to take   carbamide peroxide 6.5 % OTIC solution Commonly known as:  DEBROX Place 3-5 drops into the left ear 2 (two) times daily.   cefUROXime 500 MG tablet Commonly known as:  CEFTIN Take 500 mg by mouth 2 (two) times daily.   fluticasone 50 MCG/ACT nasal spray Commonly known as:  FLONASE Place 1 spray into both nostrils daily as needed for allergies or rhinitis.   furosemide 20 MG tablet Commonly known as:  LASIX Take 20 mg by mouth daily.   gabapentin 400 MG capsule Commonly known as:  NEURONTIN Take 400 mg by mouth 3 (three) times daily.   loratadine 10 MG tablet Commonly known as:  CLARITIN Take 10 mg by mouth daily.   metoprolol tartrate 25 MG tablet Commonly known as:  LOPRESSOR Take 37.5 mg by mouth 2 (two) times daily.   tamsulosin 0.4 MG Caps capsule Commonly known as:  FLOMAX Take 0.4 mg by mouth 2 (two) times daily after a meal.      Follow-up Information    [facility].   Specialty:  Rehabilitation Why:  They will contact you for the first appointment Contact information: [address] [place]. Schedule an appointment as soon as possible for a visit in 1 week(s).   Contact information: [place]  [phone]       [place] [facility]. Schedule an appointment as soon as possible for a visit in 4 week(s).            Consultations:  Neurology  Procedures/Studies:  2D echo  Study Conclusions - Left ventricle: The cavity size was normal. There was moderate concentric hypertrophy. Systolic function was mildly reduced. The estimated ejection fraction was in the range of 45% to 50%. Wall motion was normal; there were no regional wall motion abnormalities. - Aortic valve: A bioprosthesis was present and functioning normally. There was no regurgitation. Valve area (VTI): 3.39 cm^2. Valve area (Vmax): 2.99 cm^2. Valve area (Vmean): 2.92 cm^2. - Aorta: Ascending aortic diameter: 51 mm (S) at the sinus of Valsalva. - Ascending aorta: The ascending aorta was moderately dilated. - Mitral valve: There was mild regurgitation. - Left atrium: The atrium was moderately dilated. Volume/bsa, S: 39.7 ml/m^2. - Pulmonary arteries: Systolic pressure was mildly increased. PA peak pressure: 33 mm Hg (S).   Ct Angio Head W Or Wo Contrast  Result Date: 10/15/2017 CLINICAL DATA:  Diplopia. EXAM: CT ANGIOGRAPHY HEAD AND NECK TECHNIQUE: Multidetector CT imaging of the head and neck was performed using the  standard protocol during bolus administration of intravenous contrast. Multiplanar CT image reconstructions and MIPs were obtained to evaluate the vascular anatomy. Carotid stenosis measurements (when applicable) are obtained utilizing NASCET criteria, using the distal internal carotid diameter as the denominator. CONTRAST:  158mL ISOVUE-370 IOPAMIDOL (ISOVUE-370) INJECTION 76% COMPARISON:  Head CT and MRI 10/15/2017 FINDINGS: CTA NECK FINDINGS Aortic arch: Standard 3 vessel aortic arch. Nonstenotic calcified plaque at the left subclavian artery origin. Right carotid system: Patent without evidence of stenosis or dissection. Tortuous cervical ICA with partial retropharyngeal course. Left carotid system: Patent with minimal nonstenotic atherosclerotic plaque at the carotid bifurcation. Tortuous ICA. Subtle heterogeneous  opacification of the proximal ICA with suggestion of a very thin curvilinear filling defect along the posterior wall beginning at the ICA origin and potentially extending over a length of 3 cm. Vertebral arteries: Patent with the left being strongly dominant. No evidence of dissection. Minimal nonstenotic plaque in the proximal left V1 segment. Marked diffuse hypoplasia of the right vertebral artery. Skeleton: Severe diffuse cervical disc degeneration. Other neck: Enlarged left supraclavicular lymph nodes measuring up to 2.9 x 2.0 cm. Upper chest: Mild mediastinal lymphadenopathy with a lower right paratracheal lymph node measuring 1.8 cm. Severe centrilobular emphysema. Review of the MIP images confirms the above findings CTA HEAD FINDINGS Anterior circulation: The internal carotid arteries are patent with mild nonstenotic atherosclerosis bilaterally. ACAs and MCAs are patent without evidence of proximal branch occlusion or significant stenosis. No aneurysm is identified. Posterior circulation: The intracranial left vertebral artery supplies the basilar. There is mild left V4 atherosclerosis without stenosis. The right vertebral artery terminates in PICA. Patent SCA origins are visualized bilaterally. The basilar artery is widely patent. Posterior communicating arteries are diminutive or absent. PCAs are patent without evidence of significant stenosis. No aneurysm is identified. Venous sinuses: As permitted by contrast timing, patent. Anatomic variants: Hypoplastic right vertebral artery ending in PICA. Delayed phase: No abnormal enhancement. Review of the MIP images confirms the above findings IMPRESSION: 1. Subtle curvilinear filling defect in the proximal left ICA which could reflect a thin strand of thrombus or dissection flap. 2. No carotid or vertebral artery stenosis. 3. Patent circle of Willis without major branch occlusion or significant stenosis. 4. Left supraclavicular and mediastinal lymphadenopathy.  Nonemergent chest CT is recommended to evaluate further as lung cancer is a consideration. 5. Aortic Atherosclerosis (ICD10-I70.0) and Emphysema (ICD10-J43.9). Electronically Signed   By: Logan Bores M.D.   On: 10/15/2017 18:48   Dg Chest 2 View  Result Date: 10/15/2017 CLINICAL DATA:  Dizziness. EXAM: CHEST - 2 VIEW COMPARISON:  None. FINDINGS: Patient is status post aortic valve replacement. There is prominence of the aorta with tortuosity. Aneurysm cannot be excluded on this study. No pneumothorax. No overt edema. Probable bleb in the lateral right mid lung. No focal infiltrate, nodule, or mass. The pulmonary arteries are prominent as well. IMPRESSION: 1. Tortuous prominent thoracic aorta with no comparison. Aneurysm is not excluded on this study. 2. Prominent pulmonary arteries raises the possibility of pulmonary arterial hypertension. 3. No acute abnormalities otherwise seen. Electronically Signed   By: Dorise Bullion III M.D   On: 10/15/2017 22:12   Ct Head Wo Contrast  Result Date: 10/15/2017 CLINICAL DATA:  Dizziness, focal neural deficit of greater than 6 hours, stroke suspected, history CHF, hypertension, former smoker EXAM: CT HEAD WITHOUT CONTRAST TECHNIQUE: Contiguous axial images were obtained from the base of the skull through the vertex without intravenous contrast. COMPARISON:  CT head 06/03/2005 FINDINGS:  Brain: Generalized atrophy. Normal ventricular morphology. No midline shift or mass effect. Small vessel chronic ischemic changes of deep cerebral white matter. Small old infarcts medial LEFT cerebellar hemisphere and LEFT caudate. No intracranial hemorrhage, mass lesion, evidence of acute infarction, or extra-axial fluid collection. Vascular: No hyperdense vessels. Atherosclerotic calcifications of the LEFT vertebral artery. Skull: Intact Sinuses/Orbits: Clear Other: N/A IMPRESSION: Atrophy with small vessel chronic ischemic changes of deep cerebral white matter. 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 No acute intracranial abnormalities. Electronically Signed   By: Lavonia Dana M.D.   On: 10/15/2017 14:18   Ct Angio Neck W And/or Wo Contrast  Result Date: 10/15/2017 CLINICAL DATA:  Diplopia. EXAM: CT ANGIOGRAPHY HEAD AND NECK TECHNIQUE: Multidetector CT imaging of the head and neck was performed using the standard protocol during bolus administration of intravenous contrast. Multiplanar CT image reconstructions and MIPs were obtained to evaluate the vascular anatomy. Carotid stenosis measurements (when applicable) are obtained utilizing NASCET criteria, using the distal internal carotid diameter as the denominator. CONTRAST:  180mL ISOVUE-370 IOPAMIDOL (ISOVUE-370) INJECTION 76% COMPARISON:  Head CT and MRI 10/15/2017 FINDINGS: CTA NECK FINDINGS Aortic arch: Standard 3 vessel aortic arch. Nonstenotic calcified plaque at the left subclavian artery origin. Right carotid system: Patent without evidence of stenosis or dissection. Tortuous cervical ICA with partial retropharyngeal course. Left carotid system: Patent with minimal nonstenotic atherosclerotic plaque at the carotid bifurcation. Tortuous ICA. Subtle heterogeneous opacification of the proximal ICA with suggestion of a very thin curvilinear filling defect along the posterior wall beginning at the ICA origin and potentially extending over a length of 3 cm. Vertebral arteries: Patent with the left being strongly dominant. No evidence of dissection. Minimal nonstenotic plaque in the proximal left V1 segment. Marked diffuse hypoplasia of the right vertebral artery. Skeleton: Severe diffuse cervical disc degeneration. Other neck: Enlarged left supraclavicular lymph nodes measuring up to 2.9 x 2.0 cm. Upper chest: Mild mediastinal lymphadenopathy with a lower right paratracheal lymph node measuring 1.8 cm. Severe centrilobular emphysema. Review of the MIP images confirms the above findings CTA HEAD FINDINGS Anterior circulation: The  internal carotid arteries are patent with mild nonstenotic atherosclerosis bilaterally. ACAs and MCAs are patent without evidence of proximal branch occlusion or significant stenosis. No aneurysm is identified. Posterior circulation: The intracranial left vertebral artery supplies the basilar. There is mild left V4 atherosclerosis without stenosis. The right vertebral artery terminates in PICA. Patent SCA origins are visualized bilaterally. The basilar artery is widely patent. Posterior communicating arteries are diminutive or absent. PCAs are patent without evidence of significant stenosis. No aneurysm is identified. Venous sinuses: As permitted by contrast timing, patent. Anatomic variants: Hypoplastic right vertebral artery ending in PICA. Delayed phase: No abnormal enhancement. Review of the MIP images confirms the above findings IMPRESSION: 1. Subtle curvilinear filling defect in the proximal left ICA which could reflect a thin strand of thrombus or dissection flap. 2. No carotid or vertebral artery stenosis. 3. Patent circle of Willis without major branch occlusion or significant stenosis. 4. Left supraclavicular and mediastinal lymphadenopathy. Nonemergent chest CT is recommended to evaluate further as lung cancer is a consideration. 5. Aortic Atherosclerosis (ICD10-I70.0) and Emphysema (ICD10-J43.9). Electronically Signed   By: Logan Bores M.D.   On: 10/15/2017 18:48   Ct Chest W Contrast  Result Date: 10/17/2017 CLINICAL DATA:  Thoracic adenopathy.  Inpatient. EXAM: CT CHEST WITH CONTRAST TECHNIQUE: Multidetector CT imaging of the chest was performed during intravenous contrast administration. CONTRAST:  75mL OMNIPAQUE IOHEXOL 350  MG/ML SOLN COMPARISON:  10/15/2017 chest radiograph. FINDINGS: Cardiovascular: Mild cardiomegaly. No significant pericardial effusion/thickening. Aortic valvular prosthesis is in place. Atherosclerotic thoracic aorta with 5.0 cm ascending thoracic aortic aneurysm. Dilated  main pulmonary artery (4.0 cm diameter). No central pulmonary emboli. Mediastinum/Nodes: No discrete thyroid nodules. Small amount of oral contrast layering in the thoracic esophagus. No pathologically enlarged axillary nodes. Enlarged 2.0 cm left supraclavicular node (series 3/image 26). Right paratracheal adenopathy measuring up to 2.2 cm (series 3/image 71). Enlarged 2.0 cm subcarinal node (series 3/image 84). Enlarged 1.2 cm AP window node (series 3/image 84). Enlarged 2.3 cm right hilar node (series 3/image 83). Enlarged 1.5 cm left hilar node (series 3/image 87). Lungs/Pleura: No pneumothorax. No pleural effusion. Central left lower lobe 4.5 x 3.0 cm lung mass with associated mild extrinsic narrowing of the distal left mainstem bronchus and proximal left upper and left lower lobe airways. Severe centrilobular and paraseptal emphysema with mild diffuse bronchial wall thickening. Several solid pulmonary nodules scattered in both lungs, largest 8 mm in the right lower lobe (series 7/image 99). Upper abdomen: No acute abnormality. Musculoskeletal: No aggressive appearing focal osseous lesions. Symmetric mild gynecomastia. Intact sternotomy wires. Marked thoracic spondylosis. IMPRESSION: 1. Central left lower lobe 4.5 cm lung mass with associated mild extrinsic narrowing of the distal left mainstem bronchus and proximal left upper and left lower lobe airways. Primary bronchogenic carcinoma is the diagnosis of exclusion. 2. Several subcentimeter solid pulmonary nodules scattered in both lungs, suspicious for pulmonary metastases. 3. Bilateral hilar, subcarinal, right paratracheal, AP window and left supraclavicular adenopathy suspicious for metastatic disease. 4. Multidisciplinary thoracic oncology consultation suggested. 5. Dilated main pulmonary artery, suggesting pulmonary arterial hypertension. 6. Mild cardiomegaly. 7. 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 Ascending thoracic aortic aneurysm. Recommend  semi-annual imaging followup by CTA or MRA and referral to cardiothoracic surgery if not already obtained. This recommendation follows 2010 ACCF/AHA/AATS/ACR/ASA/SCA/SCAI/SIR/STS/SVM Guidelines for the Diagnosis and Management of Patients With Thoracic Aortic Disease. Circulation. 2010; 121: D638-V564. Aortic Atherosclerosis (ICD10-I70.0) and Emphysema (ICD10-J43.9). Electronically Signed   By: Ilona Sorrel M.D.   On: 10/17/2017 11:59   [name]  Result Date: 10/15/2017 CLINICAL DATA:  Dizziness and double vision. EXAM: MRI HEAD WITHOUT CONTRAST TECHNIQUE: Multiplanar, multiecho pulse sequences of the brain and surrounding structures were obtained without intravenous contrast. COMPARISON:  CT head 10/15/2017 FINDINGS: Brain: Negative for acute infarct. Moderate chronic microvascular ischemic changes throughout the white matter. Chronic ischemia in the basal ganglia bilaterally, thalamus bilaterally, and pons. Chronic hemorrhage in the left medial cerebellum. Negative for mass or fluid collection. No midline shift. Moderate atrophy. 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 Paranasal sinuses clear. Bilateral cataract surgery. Other: None IMPRESSION: Negative for acute infarct. Moderate atrophy and moderate chronic microvascular ischemia. Electronically Signed   By: Franchot Gallo M.D.   On: 10/15/2017 15:56   Mr Jeri Cos PP Contrast  Result Date: 10/18/2017 CLINICAL DATA:  Recently diagnosed malignancy.  Dizziness. EXAM: MRI HEAD WITHOUT AND WITH CONTRAST TECHNIQUE: Multiplanar, multiecho pulse sequences of the brain and surrounding structures were obtained without and with intravenous contrast. CONTRAST:  7.5 cc Gadavist intravenous COMPARISON:  10/15/2017 FINDINGS: Brain: Acute lacunar infarct in the midbrain located just anterior to the cerebral aqua duct. There is a background of chronic small vessel  ischemia with gliosis in the cerebral white matter and pons. Remote lacunar infarcts in the left centrum semiovale and left thalamus.  Multiple remote micro hemorrhages seen both along the hemispheres and more extensively in the deep left cerebellum and deep gray nuclei. No acute hemorrhage, hydrocephalus, or collection. Vascular: Major flow voids and vascular enhancements are preserved. Skull and upper cervical spine: No evidence of marrow lesion Sinuses/Orbits: Bilateral cataract resection.  Tornwaldt cyst IMPRESSION: 1. Acute lacunar infarct in the midbrain. 2. Chronic small vessel ischemia. 3. Remote micro hemorrhages, likely hypertensive. Electronically Signed   By: Monte Fantasia M.D.   On: 10/18/2017 03:15   Mr Mrv Head Wo Cm  Result Date: 10/17/2017 CLINICAL DATA:  77 year old male with dizziness and diplopia. EXAM: MR VENOGRAM the HEAD WITHOUT CONTRAST TECHNIQUE: Angiographic images of the intracranial venous structures were obtained using MRV technique without intravenous contrast. COMPARISON:  Brain MRI, CTA head and neck 10/15/2017. FINDINGS: 3D time-of-flight technique utilized. Preserved flow signal in the superior sagittal sinus, the torcula, the straight sinus, the vein of Galen, the internal cerebral veins, and basal veins of Rosenthal. Preserved flow signal in the dominant right transverse and sigmoid, and non dominant left transverse and sigmoid sinuses. Preserved flow signal in the dominant right IJ bulb. The non dominant left IJ is diminutive and poorly visible on the CTA of the neck yesterday, and might be occluded due to left thoracic inlet lymphadenopathy, uncertain. IMPRESSION: Negative for intracranial venous sinus thrombosis. Dominant right IJ. Non dominant left IJ which might be occluded in the neck, uncertain, perhaps related to the left thoracic inlet lymphadenopathy demonstrated on CTA yesterday. Electronically Signed   By: Genevie Ann M.D.   On: 10/17/2017 10:15     Subjective: -  no chest pain, shortness of breath, no abdominal pain, nausea or vomiting.  Still seeing double  Discharge Exam: Vitals:   10/18/17 0735 10/18/17 1030  BP: 115/90 106/85  Pulse: 69 78  Resp: 18 18  Temp: 98.3 F (36.8 C)   SpO2: 90%     General: Pt is alert, awake, not in acute distress Cardiovascular: irregular, no rubs, no gallops Respiratory: CTA bilaterally, no wheezing, no rhonchi Abdominal: Soft, NT, ND, bowel sounds + Extremities: no edema, no cyanosis    The results of significant diagnostics from this hospitalization (including imaging, microbiology, ancillary and laboratory) are listed below for reference.     Microbiology: No results found for this or any previous visit (from the past 240 hour(s)).   Labs: BNP (last 3 results) No results for input(s): BNP in the last 8760 hours. Basic Metabolic Panel: Recent Labs  Lab 10/15/17 1228 10/16/17 0721  NA 143 138  K 4.2 3.8  CL 106 104  CO2 26 25  GLUCOSE 94 98  BUN 16 10  CREATININE 1.24 1.14  CALCIUM 9.2 8.7*   Liver Function Tests: No results for input(s): AST, ALT, ALKPHOS, BILITOT, PROT, ALBUMIN in the last 168 hours. No results for input(s): LIPASE, AMYLASE in the last 168 hours. No results for input(s): AMMONIA in the last 168 hours. CBC: Recent Labs  Lab 10/15/17 1228 10/17/17 0803 10/18/17 0637  WBC 8.7 7.5 7.2  HGB 14.3 13.7 12.6*  HCT 44.0 43.2 40.5  MCV 94.4 94.5 96.0  PLT 279 233 238   Cardiac Enzymes: No results for input(s): CKTOTAL, CKMB, CKMBINDEX, TROPONINI in the last 168 hours. BNP: Invalid input(s): POCBNP CBG: Recent Labs  Lab 10/15/17 1139 10/16/17 2218  GLUCAP 84 102*   D-Dimer No results for input(s): DDIMER in the last 72 hours. Hgb A1c Recent Labs    10/16/17 0721  HGBA1C 5.2   Lipid Profile Recent Labs    10/16/17 0721  CHOL 68  HDL 34*  LDLCALC 23  TRIG 55  CHOLHDL 2.0   Thyroid function studies No results for input(s): TSH, T4TOTAL, T3FREE,  THYROIDAB in the last 72 hours.  Invalid input(s): FREET3 Anemia work up No results for input(s): VITAMINB12, FOLATE, FERRITIN, TIBC, IRON, RETICCTPCT in the last 72 hours. Urinalysis    Component Value Date/Time   COLORURINE YELLOW 10/15/2017 1850   APPEARANCEUR CLEAR 10/15/2017 1850   LABSPEC 1.032 (H) 10/15/2017 1850   PHURINE 7.0 10/15/2017 1850   GLUCOSEU NEGATIVE 10/15/2017 1850   HGBUR NEGATIVE 10/15/2017 1850   BILIRUBINUR NEGATIVE 10/15/2017 1850   KETONESUR NEGATIVE 10/15/2017 1850   PROTEINUR NEGATIVE 10/15/2017 1850   NITRITE NEGATIVE 10/15/2017 1850   LEUKOCYTESUR NEGATIVE 10/15/2017 1850   Sepsis Labs Invalid input(s): PROCALCITONIN,  WBC,  LACTICIDVEN   Time coordinating discharge: 45 minutes  SIGNED:  Marzetta Board, MD  [facility] 10/18/2017, 3:33 PM Pager [phone]  If 7PM-7AM, please contact night-coverage [email or web address] Password [email or web address]

## 2017-11-01 ENCOUNTER — Encounter: Payer: Self-pay | Admitting: Cardiology

## 2017-11-04 ENCOUNTER — Encounter: Payer: Self-pay | Admitting: Neurology

## 2017-11-05 ENCOUNTER — Encounter: Payer: Self-pay | Admitting: Pulmonary Disease

## 2017-11-05 ENCOUNTER — Ambulatory Visit (INDEPENDENT_AMBULATORY_CARE_PROVIDER_SITE_OTHER): Payer: Non-veteran care | Admitting: Pulmonary Disease

## 2017-11-05 VITALS — BP 118/72 | HR 87 | Ht 71.0 in | Wt 180.0 lb

## 2017-11-05 DIAGNOSIS — R911 Solitary pulmonary nodule: Secondary | ICD-10-CM

## 2017-11-05 DIAGNOSIS — R918 Other nonspecific abnormal finding of lung field: Secondary | ICD-10-CM

## 2017-11-05 DIAGNOSIS — I639 Cerebral infarction, unspecified: Secondary | ICD-10-CM | POA: Diagnosis not present

## 2017-11-05 DIAGNOSIS — J432 Centrilobular emphysema: Secondary | ICD-10-CM

## 2017-11-05 NOTE — Patient Instructions (Signed)
Will arrange for PET scan Will get copy of breathing test from [facility] Follow up in 4 weeks

## 2017-11-05 NOTE — Progress Notes (Signed)
Pulmonary, Critical Care, and Sleep Medicine  Chief Complaint  Patient presents with  . Pulm Consult    Referred by [facility] for lung mass. Patient denies any breathing or SOB.      Constitutional:  BP 118/72 (BP Location: Left Arm, Patient Position: Sitting, Cuff Size: Normal)   Pulse 87   Ht 5\' 11"  (1.803 m)   Wt 180 lb (81.6 kg)   SpO2 91%   BMI 25.10 kg/m   Past Medical History:  S/p AVR, Cataract, CVA, Systolic CHF, HTN, Vit D deficiency, HLD, A fib  Brief Summary:  Cody Lane is a 77 y.o. male former smoker with lung mass.  He is a English as a second language teacher.  He was in hospital earlier this month for an acute lacunar infarct in the midbrain.  He had neuroimaging studies that showed adenopathy.  He had CT chest that showed adenopathy, LLL mass, and severe emphysema (imaging studies reviewed by me).    He had PFT at [facility] on 11/04/17.  He has albuterol inhaler, but doesn't use much.  He quit smoking several years ago.  Gets winded with walking.  Uses a walker to help with his balance.  Has cough with clear sputum.  Denies headache, blurred vision, hoarseness, chest pain, fever, hemoptysis, joint swelling, or leg swelling.  Has lost about 45 lbs in the past several months.  He has noticed feeling colder since he lost weight.   Physical Exam:   Appearance - thin  ENMT - clear nasal mucosa, midline nasal  septum, no oral exudates, no LAN, trachea midline  Respiratory - normal chest wall, normal respiratory effort, no accessory muscle use, decreased breath sounds, scattered rhonchi, no wheeze/rales  CV - irregular, 2/6 systolic murmurs, no peripheral edema, radial pulses symmetric  GI - soft, non tender, no masses  Lymph - no adenopathy noted in neck and axillary areas  MSK - uses a walker  Ext - no cyanosis, clubbing, or joint inflammation noted  Skin - no rashes, lesions, or ulcers  Neuro - normal strength, oriented x 3  Psych - normal mood and  affect  Discussion:  He has history of smoking.  Found to have adenopathy, severe emphysema, and LLL mass on CT chest.  Concern is for malignancy.  Assessment/Plan:   Lt lower lung mass and adenopathy. - will arrange for PET scan to assess optimal approach for biopsy  Severe centrilobular and paraseptal emphysema. - prn albuterol - will get copy of PFT from [facility], s/p AVR, dilated thoracic aorta, chronic systolic CHF. - will need to hold eliquis prior to biopsy - f/u with cardiology  Embolic CVA. - f/u with neurology   Patient Instructions  Will arrange for PET scan Will get copy of breathing test from [facility] Follow up in 4 weeks    [name], MD [place] Pager: [phone] 11/05/2017, 5:24 PM  Flow Sheet     Pulmonary tests:  CT chest 10/17/17 >> 5 cm ascending thoracic aorta, main PA 4 cm, 2 cm Lt [place] node, 2.2 cm Rt paratracheal node, 2 cm subcarinal node, 2.3 cm Rt hilar node, central lt lower lobe 4.5 cm mass with narrowing of bronchus, severe centrilobular and paraseptal emphysema, scattered nodules  Cardiac tests:  Echo 10/16/17 >> EF 45 to 50%, bioprosthetic AV, 51 mm ascending aorta, mild MR, PAS 33 mmHg   Review of Systems:  Negative except in HPI.  Medications:   Allergies as of 11/05/2017  No Known Allergies     Medication List        Accurate as of 11/05/17  5:24 PM. Always use your most recent med list.          albuterol 108 (90 Base) MCG/ACT inhaler Commonly known as:  PROVENTIL HFA;VENTOLIN HFA Inhale 2 puffs into the lungs every 6 (six) hours as needed for wheezing or shortness of breath.   apixaban 5 MG Tabs tablet Commonly known as:  ELIQUIS Take 1 tablet (5 mg total) by mouth 2 (two) times daily.   atorvastatin 10 MG tablet Commonly known as:  LIPITOR Take 1 tablet (10 mg total) by mouth at bedtime.   carbamide peroxide 6.5 % OTIC solution Commonly known as:   DEBROX Place 3-5 drops into the left ear 2 (two) times daily.   cefUROXime 500 MG tablet Commonly known as:  CEFTIN Take 500 mg by mouth 2 (two) times daily.   fluticasone 50 MCG/ACT nasal spray Commonly known as:  FLONASE Place 1 spray into both nostrils daily as needed for allergies or rhinitis.   furosemide 20 MG tablet Commonly known as:  LASIX Take 20 mg by mouth daily.   gabapentin 400 MG capsule Commonly known as:  NEURONTIN Take 400 mg by mouth 3 (three) times daily.   loratadine 10 MG tablet Commonly known as:  CLARITIN Take 10 mg by mouth daily.   metoprolol tartrate 25 MG tablet Commonly known as:  LOPRESSOR Take 37.5 mg by mouth 2 (two) times daily.   tamsulosin 0.4 MG Caps capsule Commonly known as:  FLOMAX Take 0.4 mg by mouth 2 (two) times daily after a meal.       Past Surgical History:  He  has a past surgical history that includes Aortic valve replacement.  Family History:  His family history includes Stroke in his father.  Social History:  He  reports that he has quit smoking. He has never used smokeless tobacco. He reports that he does not drink alcohol or use drugs.

## 2017-11-15 NOTE — Progress Notes (Signed)
NEUROLOGY CONSULTATION NOTE  [employer] MRN: [id number] DOB: [birth year]  Referring provider: [name], MD Primary care provider: [facility]  Reason for consult:  Stroke  HISTORY OF PRESENT ILLNESS: Cody Lane is a 77 year old right-handed male with paroxysmal atrial fibrillation, hypertension, mitral valve and aortic valve regurgitation, CHF, and cerebral aneurysm who presents for recent stroke.  History supplemented by hospital notes.  Mr. Cardosa was admitted to [facility] from 10/15/2017 to 10/18/2017 after presenting with sudden onset of diplopia, dizziness, and unsteady gait.  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  CT of the head was personally reviewed and revealed old left cerebellar and left caudate infarcts but no acute abnormalities.  He did not receive tPA due to late presentation.  MRI of the brain was personally reviewed and revealed a small acute infarct in the left dorsal lower midbrain.  CTA of the head and neck revealed subtle curvilinear filling defect in the proximal left ICA which could represent old dissection versus carotid web or artifact.  MRV of head was negative for intracranial venous sinus thrombosis.  2D echocardiogram revealed ejection fraction of 45 to 50%.  LDL was 23.  Hemoglobin A1c was 5.2.  He was diagnosed with paroxysmal atrial fibrillation in 2016 but was not on anticoagulation.  While in the hospital, he was rate controlled.  He is now on Eliquis.  Lipitor was decreased from 20mg  to 10mg  daily.    He was also found to have lung mass on CT hest wall, suspicious for metastasis for which he has followed up with outpatient pulmonology.  PET scan is pending.  Since the stroke, his wife notes he is more fatigued and forgetful.  She has to repeat things to him.  He says he still sometimes has double vision.  He was discharged with referral for PT/OT but has never received a call to set up appointment.  PAST MEDICAL  HISTORY: Past Medical History:  Diagnosis Date  . Aortic valve regurgitation 01/26/14  . Cataract 08/17/2013  . 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 CHF (congestive heart failure) ([place]) 01/26/2014  . Hypertension   . Mitral valve regurgitation 01/26/2014  . Pseudophakia 08/17/2013  . Vitamin D deficiency     PAST SURGICAL HISTORY: Past Surgical History:  Procedure Laterality Date  . AORTIC VALVE REPLACEMENT      MEDICATIONS: Current Outpatient Medications on File Prior to Visit  Medication Sig Dispense Refill  . albuterol (PROVENTIL HFA;VENTOLIN HFA) 108 (90 BASE) MCG/ACT inhaler Inhale 2 puffs into the lungs every 6 (six) hours as needed for wheezing or shortness of breath.    Marland Kitchen apixaban (ELIQUIS) 5 MG TABS tablet Take 1 tablet (5 mg total) by mouth 2 (two) times daily. 60 tablet 1  . atorvastatin (LIPITOR) 10 MG tablet Take 1 tablet (10 mg total) by mouth at bedtime. 30 tablet 0  . carbamide peroxide (DEBROX) 6.5 % OTIC solution Place 3-5 drops into the left ear 2 (two) times daily.    . cefUROXime (CEFTIN) 500 MG tablet Take 500 mg by mouth 2 (two) times daily.   0  . fluticasone (FLONASE) 50 MCG/ACT nasal spray Place 1 spray into both nostrils daily as needed for allergies or rhinitis.    . furosemide (LASIX) 20 MG tablet Take 20 mg by mouth daily.    Marland Kitchen gabapentin (NEURONTIN) 400 MG capsule Take 400 mg by mouth 3 (three) times daily.    Marland Kitchen loratadine (CLARITIN) 10 MG  tablet Take 10 mg by mouth daily.    . metoprolol tartrate (LOPRESSOR) 25 MG tablet Take 37.5 mg by mouth 2 (two) times daily.     . tamsulosin (FLOMAX) 0.4 MG CAPS capsule Take 0.4 mg by mouth 2 (two) times daily after a meal.      No current facility-administered medications on file prior to visit.     ALLERGIES: No Known Allergies  FAMILY HISTORY: Family History  Problem Relation Age of Onset  . Stroke Father    SOCIAL HISTORY: Social History   Socioeconomic History  . Marital status: Married    Spouse  name: Not on file  . Number of children: Not on file  . Years of education: Not on file  . Highest education level: Not on file  Occupational History  . Not on file  Social Needs  . Financial resource strain: Not on file  . Food insecurity:    Worry: Not on file    Inability: Not on file  . Transportation needs:    Medical: Not on file    Non-medical: Not on file  Tobacco Use  . Smoking status: Former Research scientist (life sciences)  . Smokeless tobacco: Never Used  Substance and Sexual Activity  . Alcohol use: No    Alcohol/week: 0.0 standard drinks  . Drug use: No  . Sexual activity: Not on file  Lifestyle  . Physical activity:    Days per week: Not on file    Minutes per session: Not on file  . Stress: Not on file  Relationships  . Social connections:    Talks on phone: Not on file    Gets together: Not on file    Attends religious service: Not on file    Active member of club or organization: Not on file    Attends meetings of clubs or organizations: Not on file    Relationship status: Not on file  . Intimate partner violence:    Fear of current or ex partner: Not on file    Emotionally abused: Not on file    Physically abused: Not on file    Forced sexual activity: Not on file  Other Topics Concern  . Not on file  Social History Narrative  . Not on file    REVIEW OF SYSTEMS: Constitutional: No fevers, chills, or sweats, no generalized fatigue, change in appetite Eyes: No visual changes, double vision, eye pain Ear, nose and throat: No hearing loss, ear pain, nasal congestion, sore throat Cardiovascular: No chest pain, palpitations Respiratory:  No shortness of breath at rest or with exertion, wheezes GastrointestinaI: No nausea, vomiting, diarrhea, abdominal pain, fecal incontinence Genitourinary:  No dysuria, urinary retention or frequency Musculoskeletal:  No neck pain, back pain Integumentary: No rash, pruritus, skin lesions Neurological: as above Psychiatric: No depression,  insomnia, anxiety Endocrine: No palpitations, fatigue, diaphoresis, mood swings, change in appetite, change in weight, increased thirst Hematologic/Lymphatic:  No purpura, petechiae. Allergic/Immunologic: no itchy/runny eyes, nasal congestion, recent allergic reactions, rashes  PHYSICAL EXAM: Blood pressure 110/82, pulse 69, height 6' (1.829 m), weight 182 lb (82.6 kg), SpO2 94 %. General: No acute distress.  Patient appears well-groomed.   Head:  Normocephalic/atraumatic Eyes:  fundi examined but not visualized Neck: supple, no paraspinal tenderness, full range of motion Back: No paraspinal tenderness Heart: regular rate and rhythm Lungs: Clear to auscultation bilaterally. Vascular: No carotid bruits. Neurological Exam: Mental status: alert and oriented to person, place, and time, recent and remote memory intact, fund of knowledge  intact, attention and concentration intact, speech fluent and not dysarthric, language intact. Cranial nerves: CN I: not tested CN II: pupils equal, round and reactive to light, visual fields intact CN III, IV, VI:  Left INO, no nystagmus, no ptosis CN V: facial sensation intact CN VII: upper and lower face symmetric CN VIII: hearing intact CN IX, X: gag intact, uvula midline CN XI: sternocleidomastoid and trapezius muscles intact CN XII: tongue midline Bulk & Tone: normal, no fasciculations. Motor:  5/5 throughout  Sensation:  Pinprick and vibration sensation intact. Deep Tendon Reflexes:  2+ throughout, toes downgoing. Finger to nose testing:  Without dysmetria.  Heel to shin:  Without dysmetria.  Gait:  Normal station and stride.  Romberg negative.  IMPRESSION: 1.  Left lower midbrain infarct, cryptogenic.   2.  Paroxysmal atrial fibrillation 3.  Hypertension 4.  Hyperlipidemia. 5.  Left ICA curvilinear filling defect.  PLAN: 1.  Continue Eliquis for secondary stroke prevention 2.  Continue atorvastatin 10mg  daily (LDL goal less than 70) 3.   Continue blood pressure management 4.  Advised to contact PT to set up appointment 5.  Consider wearing patch on eye to prevent diplopia 6.  Check CTA of neck to follow up on left ICA curvilinear filling defect 7.  Follow up in 5 months.  Thank you for allowing me to take part in the care of this patient.  Metta Clines, DO  CC:  [facility]

## 2017-11-16 ENCOUNTER — Other Ambulatory Visit: Payer: Self-pay

## 2017-11-16 ENCOUNTER — Encounter: Payer: Self-pay | Admitting: Neurology

## 2017-11-16 ENCOUNTER — Ambulatory Visit (INDEPENDENT_AMBULATORY_CARE_PROVIDER_SITE_OTHER): Payer: No Typology Code available for payment source | Admitting: Neurology

## 2017-11-16 VITALS — BP 110/82 | HR 69 | Ht 72.0 in | Wt 182.0 lb

## 2017-11-16 DIAGNOSIS — H5122 Internuclear ophthalmoplegia, left eye: Secondary | ICD-10-CM

## 2017-11-16 DIAGNOSIS — I639 Cerebral infarction, unspecified: Secondary | ICD-10-CM | POA: Diagnosis not present

## 2017-11-16 DIAGNOSIS — I10 Essential (primary) hypertension: Secondary | ICD-10-CM | POA: Diagnosis not present

## 2017-11-16 DIAGNOSIS — I63232 Cerebral infarction due to unspecified occlusion or stenosis of left carotid arteries: Secondary | ICD-10-CM

## 2017-11-16 DIAGNOSIS — I480 Paroxysmal atrial fibrillation: Secondary | ICD-10-CM | POA: Diagnosis not present

## 2017-11-16 DIAGNOSIS — E785 Hyperlipidemia, unspecified: Secondary | ICD-10-CM

## 2017-11-16 NOTE — Patient Instructions (Signed)
1.  Continue Eliquis 2.  Continue atorvastatin 10mg  daily 3.  Contact physical therapist regarding scheduling for physical therapy 4.  Consider wearing patch over left eye if still having double vision 5.  Follow up in 5 months.

## 2017-11-16 NOTE — Addendum Note (Signed)
Addended by: Clois Comber on: 11/16/2017 04:16 PM   Modules accepted: Orders

## 2017-11-17 ENCOUNTER — Ambulatory Visit (HOSPITAL_COMMUNITY)
Admission: RE | Admit: 2017-11-17 | Discharge: 2017-11-17 | Disposition: A | Discharge status: Home / Self Care | Payer: Non-veteran care | Source: Ambulatory Visit | Attending: Pulmonary Disease | Admitting: Pulmonary Disease

## 2017-11-17 DIAGNOSIS — R911 Solitary pulmonary nodule: Secondary | ICD-10-CM | POA: Insufficient documentation

## 2017-11-17 LAB — GLUCOSE, CAPILLARY: Glucose-Capillary: 74 mg/dL (ref 70–99)

## 2017-11-17 MED ORDER — FLUDEOXYGLUCOSE F - 18 (FDG) INJECTION
8.4000 | Freq: Once | INTRAVENOUS | Status: AC | PRN
  Administered 2017-11-17: 8.4 via INTRAVENOUS

## 2017-11-22 ENCOUNTER — Telehealth: Payer: Self-pay | Admitting: Pulmonary Disease

## 2017-11-22 DIAGNOSIS — R918 Other nonspecific abnormal finding of lung field: Secondary | ICD-10-CM

## 2017-11-22 DIAGNOSIS — R590 Localized enlarged lymph nodes: Secondary | ICD-10-CM

## 2017-11-22 NOTE — Telephone Encounter (Signed)
PET scan 11/17/17 >> 15.2 SUV retrohilar LLL 4.7 x 3.4 mass; b/l supraclavicular, Rt paratracheal, Lt paratracheal paraesophageal, AP window and Rt hilar LAN up to 10.3 SUV   Results d/w pt and his wife.  They are agreeable to proceed with biopsy.  Will arrange for referral to interventional radiology to assess for [facility] LAN biopsy.

## 2017-11-29 NOTE — Progress Notes (Signed)
Cody Codding, MD Reason for referral-aortic valve replacement and atrial fibrillation.  HPI: 77 year old male for evaluation of aortic valve replacement and atrial fibrillation at request of Cody Curling, MD. Patient recently admitted to [facility] and diagnosed with CVA.  Patient noted to have atrial fibrillation and was placed on apixaban.  Echocardiogram October 2019 showed ejection fraction 45 to 50%, bioprosthetic aortic valve, moderately dilated ascending aorta at 51 mm, mild mitral regurgitation, moderate left atrial enlargement and mild pulmonary hypertension.  Chest CT showed left lower lobe lung mass felt likely bronchogenic carcinoma.  There were pulmonary nodules suspicious for pulmonary metastases.  There is also note of adenopathy.  Ascending thoracic aorta measured 5 cm.  PET scan revealed left lower lobe retrohilar mass with adenopathy.  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  CTA of chest, abdomen and pelvis recommended.  There is severe emphysema.  Cardiology now asked to evaluate.  Patient has some dyspnea on exertion but there is no orthopnea, PND, pedal edema, chest pain, palpitations, syncope or bleeding.  Current Outpatient Medications  Medication Sig Dispense Refill  . albuterol (PROVENTIL HFA;VENTOLIN HFA) 108 (90 BASE) MCG/ACT inhaler Inhale 2 puffs into the lungs every 6 (six) hours as needed for wheezing or shortness of breath.    Marland Kitchen amLODipine (NORVASC) 10 MG tablet Take 10 mg by mouth daily.    Marland Kitchen apixaban (ELIQUIS) 5 MG TABS tablet Take 1 tablet (5 mg total) by mouth 2 (two) times daily. 60 tablet 1  . atorvastatin (LIPITOR) 40 MG tablet Take 40 mg by mouth daily.    . furosemide (LASIX) 20 MG tablet Take 20 mg by mouth daily.    Marland Kitchen gabapentin (NEURONTIN) 400 MG capsule Take 400 mg by mouth 3 (three) times daily.    Marland Kitchen  loratadine (CLARITIN) 10 MG tablet Take 10 mg by mouth daily.    . metoprolol tartrate (LOPRESSOR) 25 MG tablet Take 37.5 mg by mouth 2 (two) times daily.     . Multiple Vitamins-Minerals (MULTIVITAMIN WITH MINERALS) tablet Take 1 tablet by mouth daily.    . tamsulosin (FLOMAX) 0.4 MG CAPS capsule Take 0.4 mg by mouth 2 (two) times daily after a meal.      No current facility-administered medications for this visit.     No Known Allergies   Past Medical History:  Diagnosis Date  . Aortic valve regurgitation 01/26/14  . Atrial fibrillation ([place])   . Cataract 08/17/2013  . 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 CHF (congestive heart failure) ([place]) 01/26/2014  . CVA (cerebral vascular accident) ([place])   . Emphysema (subcutaneous) (surgical) resulting from a procedure   . Hypertension   . 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 Lung mass   . Mitral valve regurgitation 01/26/2014  . Pseudophakia 08/17/2013  . 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 Thoracic aortic aneurysm ([place])   . Vitamin D deficiency     Past Surgical History:  Procedure Laterality Date  . AORTIC VALVE REPLACEMENT      Social History   Socioeconomic History  . Marital status: Married    Spouse name: Not on file  . Number of children: 1  . Years of education: Not on file  . Highest education level: Not on file  Occupational History  . Not on file  Social Needs  . Financial resource strain: Not on file  . Food insecurity:  Worry: Not on file    Inability: Not on file  . Transportation needs:    Medical: Not on file    Non-medical: Not on file  Tobacco Use  . Smoking status: Former Research scientist (life sciences)  . Smokeless tobacco: Never Used  Substance and Sexual Activity  . Alcohol use: Yes    Alcohol/week: 0.0 standard drinks  . Drug use: No  . Sexual activity: Not on file  Lifestyle  . Physical activity:    Days per week: Not on file    Minutes per session: Not on file  . Stress: Not on file  Relationships  . Social connections:     Talks on phone: Not on file    Gets together: Not on file    Attends religious service: Not on file    Active member of club or organization: Not on file    Attends meetings of clubs or organizations: Not on file    Relationship status: Not on file  . Intimate partner violence:    Fear of current or ex partner: Not on file    Emotionally abused: Not on file    Physically abused: Not on file    Forced sexual activity: Not on file  Other Topics Concern  . Not on file  Social History Narrative   Pt is right handed   Lives in single story home with his wife   Has 1 child   Some college education   Retired Dealer    Family History  Problem Relation Age of Onset  . Stroke Father     ROS: no fevers or chills, productive cough, hemoptysis, dysphasia, odynophagia, melena, hematochezia, dysuria, hematuria, rash, seizure activity, orthopnea, PND, pedal edema, claudication. Remaining systems are negative.  Physical Exam:   Blood pressure 104/80, pulse 68, height 6' (1.829 m), weight 174 lb (78.9 kg).  General:  Well developed/well nourished in NAD Skin warm/dry Patient not depressed No peripheral clubbing Back-normal HEENT-normal/normal eyelids Neck supple/normal carotid upstroke bilaterally; no bruits; no JVD; no thyromegaly chest -diminished breath sounds throughout with mild expiratory wheeze CV - RRR/normal S1 and S2; no rubs or gallops;  PMI nondisplaced, 2/6 systolic murmur left sternal border.  No diastolic murmur. Abdomen -NT/ND, no HSM, no mass, + bowel sounds, no bruit 2+ femoral pulses, no bruits Ext-no edema, chords, 2+ DP Neuro-grossly nonfocal  A/P  1 atrial fibrillation-plan to continue metoprolol for rate control.  Continue apixaban.  Check TSH.  He will need lifelong anticoagulation given CVA.  2 prior aortic valve replacement-plan to continue SBE prophylaxis.  Note his previous surgery was performed at [facility].  We will obtain previous records.  3  thoracic aortic aneurysm/iliac aneurysms/renal artery aneurysm-we will arrange CTA to further assess.  4 probable lung cancer-further evaluation and management at the [facility].  5 hypertension-blood pressure is controlled.  Continue present medications.  Kirk Ruths, MD

## 2017-12-01 ENCOUNTER — Other Ambulatory Visit: Payer: Self-pay | Admitting: Radiology

## 2017-12-02 ENCOUNTER — Ambulatory Visit (HOSPITAL_COMMUNITY)
Admission: RE | Admit: 2017-12-02 | Discharge: 2017-12-02 | Disposition: A | Discharge status: Home / Self Care | Payer: No Typology Code available for payment source | Source: Ambulatory Visit | Attending: Pulmonary Disease | Admitting: Pulmonary Disease

## 2017-12-02 DIAGNOSIS — R590 Localized enlarged lymph nodes: Secondary | ICD-10-CM | POA: Insufficient documentation

## 2017-12-02 DIAGNOSIS — R918 Other nonspecific abnormal finding of lung field: Secondary | ICD-10-CM | POA: Diagnosis not present

## 2017-12-02 DIAGNOSIS — E559 Vitamin D deficiency, unspecified: Secondary | ICD-10-CM | POA: Diagnosis not present

## 2017-12-02 DIAGNOSIS — Z79899 Other long term (current) drug therapy: Secondary | ICD-10-CM | POA: Insufficient documentation

## 2017-12-02 DIAGNOSIS — Z87891 Personal history of nicotine dependence: Secondary | ICD-10-CM | POA: Diagnosis not present

## 2017-12-02 DIAGNOSIS — Z8673 Personal history of transient ischemic attack (TIA), and cerebral infarction without residual deficits: Secondary | ICD-10-CM | POA: Diagnosis not present

## 2017-12-02 DIAGNOSIS — I480 Paroxysmal atrial fibrillation: Secondary | ICD-10-CM | POA: Diagnosis not present

## 2017-12-02 DIAGNOSIS — I509 Heart failure, unspecified: Secondary | ICD-10-CM | POA: Insufficient documentation

## 2017-12-02 DIAGNOSIS — I110 Hypertensive heart disease with heart failure: Secondary | ICD-10-CM | POA: Diagnosis not present

## 2017-12-02 DIAGNOSIS — Z952 Presence of prosthetic heart valve: Secondary | ICD-10-CM | POA: Insufficient documentation

## 2017-12-02 DIAGNOSIS — Z7901 Long term (current) use of anticoagulants: Secondary | ICD-10-CM | POA: Insufficient documentation

## 2017-12-02 DIAGNOSIS — Z823 Family history of stroke: Secondary | ICD-10-CM | POA: Diagnosis not present

## 2017-12-02 LAB — CBC
HCT: 46 % (ref 39.0–52.0)
Hemoglobin: 13.5 g/dL (ref 13.0–17.0)
MCH: 28.2 pg (ref 26.0–34.0)
MCHC: 29.3 g/dL — AB (ref 30.0–36.0)
MCV: 96.2 fL (ref 80.0–100.0)
PLATELETS: 219 10*3/uL (ref 150–400)
RBC: 4.78 MIL/uL (ref 4.22–5.81)
RDW: 12 % (ref 11.5–15.5)
WBC: 8.6 10*3/uL (ref 4.0–10.5)
nRBC: 0 % (ref 0.0–0.2)

## 2017-12-02 LAB — PROTIME-INR
INR: 1.25
Prothrombin Time: 15.5 seconds — ABNORMAL HIGH (ref 11.4–15.2)

## 2017-12-02 MED ORDER — SODIUM CHLORIDE 0.9 % IV SOLN: INTRAVENOUS | Status: DC

## 2017-12-02 MED ORDER — FENTANYL CITRATE (PF) 100 MCG/2ML IJ SOLN
INTRAMUSCULAR | Status: AC
  Filled 2017-12-02: qty 2

## 2017-12-02 MED ORDER — MIDAZOLAM HCL 2 MG/2ML IJ SOLN
INTRAMUSCULAR | Status: AC
  Filled 2017-12-02: qty 2

## 2017-12-02 MED ORDER — LIDOCAINE HCL (PF) 1 % IJ SOLN
INTRAMUSCULAR | Status: AC
  Filled 2017-12-02: qty 30

## 2017-12-02 MED ORDER — FENTANYL CITRATE (PF) 100 MCG/2ML IJ SOLN
INTRAMUSCULAR | Status: AC | PRN
  Administered 2017-12-02: 50 ug via INTRAVENOUS

## 2017-12-02 MED ORDER — MIDAZOLAM HCL 2 MG/2ML IJ SOLN
INTRAMUSCULAR | Status: AC | PRN
  Administered 2017-12-02: 1 mg via INTRAVENOUS

## 2017-12-02 NOTE — Consult Note (Signed)
Chief Complaint: Patient was seen in consultation today for image guided left supraclavicular lymph node biopsy  Referring Physician(s): Sood,Vineet  Supervising Physician: Markus Daft  Patient Status: [facility] - Out-pt  History of Present Illness: Cody Lane is a 77 y.o. 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 He presents today for the procedure. Recent PET scan has revealed:   1. 4.7 cm hypermetabolic left lower lobe retro hilar mass with abnormal bilateral supraclavicular, bilateral mediastinal, and right hilar adenopathy as noted above. Scattered small pulmonary nodules are not discernibly hypermetabolic. No findings of metastatic disease to the neck, abdomen/pelvis, or skeleton. 2. Various aneurysms including a 5.1 cm ascending aortic aneurysm; bilateral internal iliac artery aneurysms measuring up to 4.0 cm in diameter; a possible left renal artery aneurysm measuring 4.1 cm in diameter (versus complex cyst); and a smaller right renal artery aneurysm 1.2 cm in diameter. CT angiography of the chest, abdomen, and pelvis is recommended for additional characterization. 3. Other imaging findings of potential clinical significance: Markedly severe emphysema ( Emphysema (ICD10-J43.9).) Aortic Atherosclerosis (ICD10-I70.0). Cholelithiasis. Severe left degenerative glenohumeral arthropathy    Past Medical History:  Diagnosis Date  . Aortic valve regurgitation 01/26/14  . Cataract 08/17/2013  . 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 CHF (congestive heart failure) ([place]) 01/26/2014  . Hypertension   . Mitral valve regurgitation 01/26/2014  . Pseudophakia 08/17/2013  . Vitamin D deficiency     Past Surgical History:  Procedure Laterality Date  . AORTIC VALVE REPLACEMENT       Allergies: Patient has no known allergies.  Medications: Prior to Admission medications   Medication Sig Start Date End Date Taking? Authorizing Provider  albuterol (PROVENTIL HFA;VENTOLIN HFA) 108 (90 BASE) MCG/ACT inhaler Inhale 2 puffs into the lungs every 6 (six) hours as needed for wheezing or shortness of breath.   Yes [provider]  amLODipine (NORVASC) 10 MG tablet Take 10 mg by mouth daily.   Yes [provider]  apixaban (ELIQUIS) 5 MG TABS tablet Take 1 tablet (5 mg total) by mouth 2 (two) times daily. 10/18/17  Yes Gherghe, Vella Redhead, MD  atorvastatin (LIPITOR) 40 MG tablet Take 40 mg by mouth daily.   Yes [provider]  furosemide (LASIX) 20 MG tablet Take 20 mg by mouth daily.   Yes [provider]  gabapentin (NEURONTIN) 400 MG capsule Take 400 mg by mouth 3 (three) times daily.   Yes [provider]  loratadine (CLARITIN) 10 MG tablet Take 10 mg by mouth daily.   Yes [provider]  metoprolol tartrate (LOPRESSOR) 25 MG tablet Take 37.5 mg by mouth 2 (two) times daily.    Yes [provider]  Multiple Vitamins-Minerals (MULTIVITAMIN WITH MINERALS) tablet Take 1 tablet by mouth daily.   Yes [provider]  tamsulosin (FLOMAX) 0.4 MG CAPS capsule Take 0.4 mg by mouth 2 (two) times daily after a meal.    Yes [provider]  atorvastatin (LIPITOR) 10 MG tablet Take 1 tablet (10 mg total) by mouth at bedtime. Patient not taking: Reported on 11/26/2017 10/18/17   Caren Griffins, MD     Family History  Problem Relation Age of Onset  . Stroke Father     Social History   Socioeconomic History  . Marital status: Married    Spouse  name: Not on file  . Number of children: Not on file  . Years of education: Not on file  . Highest education level: Not on file  Occupational History  . Not on file  Social Needs  . Financial resource strain: Not on file  . Food insecurity:    Worry: Not  on file    Inability: Not on file  . Transportation needs:    Medical: Not on file    Non-medical: Not on file  Tobacco Use  . Smoking status: Former Research scientist (life sciences)  . Smokeless tobacco: Never Used  Substance and Sexual Activity  . Alcohol use: No    Alcohol/week: 0.0 standard drinks  . Drug use: No  . Sexual activity: Not on file  Lifestyle  . Physical activity:    Days per week: Not on file    Minutes per session: Not on file  . Stress: Not on file  Relationships  . Social connections:    Talks on phone: Not on file    Gets together: Not on file    Attends religious service: Not on file    Active member of club or organization: Not on file    Attends meetings of clubs or organizations: Not on file    Relationship status: Not on file  Other Topics Concern  . Not on file  Social History Narrative   Pt is right handed   Lives in single story home with his wife   Has 1 child   Some college education   Retired Dealer      Review of Systems denies fever,HA,CP,dyspnea, abd/back pain,N/V or bleeding; he does have occ cough, mild left ptosis/left sided weakness  Vital Signs: BP (!) 121/93   Pulse (!) 101   Temp 98.2 F (36.8 C)   Ht 6' (1.829 m)   Wt 188 lb (85.3 kg)   SpO2 92%   BMI 25.50 kg/m   Physical Exam awake/answers questions appropriately; mild left ptosis, moving all fours ok but has had some mild left sided weakness in past; chest- CTA bilat; heart- irreg irreg; abd- soft,+BS,NT; no LE edema  Imaging: Nm Pet Image Initial (pi) Skull Base To Thigh  Result Date: 11/17/2017 CLINICAL DATA:  Initial treatment strategy for lung nodules. EXAM: NUCLEAR MEDICINE PET SKULL BASE TO THIGH TECHNIQUE: 8.4 mCi F-18 FDG was injected intravenously. Full-ring PET imaging was performed from the skull base to thigh after the radiotracer. CT data was obtained and used for attenuation correction and anatomic localization. Fasting blood glucose: 76 mg/dl COMPARISON:  10/17/2017  FINDINGS: Mediastinal blood pool activity: SUV max 2.3 NECK: No significant abnormal hypermetabolic activity in this region. Incidental CT findings: none CHEST: The left lower lobe retro hilar mass has a maximum SUV of 15.2 and has abnormal hypermetabolic activity measuring approximately 4.7 by 3.4 cm There is abnormal hypermetabolic bilateral supraclavicular, prevascular, right paratracheal, left paratracheal, paraesophageal, subcarinal, AP window, and right hilar adenopathy. Index right paratracheal node 1.7 cm in short axis on image 69/4, maximum SUV 12.7. Conglomerate left supraclavicular adenopathy 2.4 cm in short axis on image 49/4, maximum SUV 10.3. Markedly severe paraseptal and centrilobular emphysema. Several additional small nodules are present including a 7 mm in diameter right lower lobe nodule on image 42/8 which is not appreciably hypermetabolic, and a 0.6 by 0.4 cm right middle lobe subpleural nodule on image 43/8, likewise not appreciably hypermetabolic. These nodules are generally below sensitive PET-CT size thresholds. A 0.7 cm left lower lobe nodule on image  50/8 is not appreciably hypermetabolic. Incidental CT findings: Aortic and branch vessel atherosclerotic calcification noted with aortic tortuosity. Ascending aortic aneurysm 5.1 cm in diameter. Prior CABG. Aortic valve prosthesis noted. Mild cardiomegaly. ABDOMEN/PELVIS: No significant abnormal hypermetabolic activity in this region. Incidental CT findings: Cholelithiasis. Aortoiliac atherosclerotic vascular disease. 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 Left internal iliac artery aneurysm 4.0 cm in diameter. Mild prostatomegaly. 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 SKELETON: No significant  extra-articular osseous abnormalities identified. Incidental CT findings: Severe degenerative glenohumeral arthropathy on the left. IMPRESSION: 1. 4.7 cm hypermetabolic left lower lobe retro hilar mass with abnormal bilateral supraclavicular, bilateral mediastinal, and right hilar adenopathy as noted above. Scattered small pulmonary nodules are not discernibly hypermetabolic. No findings of metastatic disease to the neck, abdomen/pelvis, or skeleton. 2. Various aneurysms including a 5.1 cm ascending aortic aneurysm; bilateral internal iliac artery aneurysms measuring up to 4.0 cm in diameter; a possible left renal artery aneurysm measuring 4.1 cm in diameter (versus complex cyst); and a smaller right renal artery aneurysm 1.2 cm in diameter. CT angiography of the chest, abdomen, and pelvis is recommended for additional characterization. 3. Other imaging findings of potential clinical significance: Markedly severe emphysema ( Emphysema (ICD10-J43.9).) Aortic Atherosclerosis (ICD10-I70.0). Cholelithiasis. Severe left degenerative glenohumeral arthropathy. Electronically Signed   By: Van Clines M.D.   On: 11/17/2017 15:39    Labs:  CBC: Recent Labs    10/15/17 1228 10/17/17 0803 10/18/17 0637  WBC 8.7 7.5 7.2  HGB 14.3 13.7 12.6*  HCT 44.0 43.2 40.5  PLT 279 233 238    COAGS: Recent Labs    10/17/17 1037 10/18/17 0637  INR 1.12 1.18    BMP: Recent Labs    10/15/17 1228 10/16/17 0721  NA 143 138  K 4.2 3.8  CL 106 104  CO2 26 25  GLUCOSE 94 98  BUN 16 10  CALCIUM 9.2 8.7*  CREATININE 1.24 1.14  GFRNONAA 54* >60  GFRAA >60 >60    LIVER FUNCTION TESTS: No results for input(s): BILITOT, AST, ALT, ALKPHOS, PROT, ALBUMIN in the last 8760 hours.  TUMOR MARKERS: No results for input(s): AFPTM, CEA, CA199, CHROMGRNA in the last 8760 hours.  Assessment and Plan: 77 y.o. male former smoker with hx PAF, HTN, MVR/AVR, CHF, cerebral aneurysm with recent midbrain infarct/left  curvilinear ICA defect who has been referred by pulmonology to IR service for image guided biopsy of left supraclavicular lymph node . He presents today for the procedure. Recent PET scan has revealed:   1. 4.7 cm hypermetabolic left lower lobe retro hilar mass with abnormal bilateral supraclavicular, bilateral mediastinal, and right hilar adenopathy as noted above. Scattered small pulmonary nodules are not discernibly hypermetabolic. No findings of metastatic disease to the neck, abdomen/pelvis, or skeleton. 2. Various aneurysms including a 5.1 cm ascending aortic aneurysm; bilateral internal iliac artery aneurysms measuring up to 4.0 cm in diameter; a possible left renal artery aneurysm measuring 4.1 cm in diameter (versus complex cyst); and a smaller right renal artery aneurysm 1.2 cm in diameter. CT angiography of the chest, abdomen, and pelvis is recommended for additional characterization. 3. Other imaging findings of potential clinical significance: Markedly severe emphysema ( Emphysema (ICD10-J43.9).) Aortic Atherosclerosis (ICD10-I70.0). Cholelithiasis. Severe left degenerative glenohumeral arthropathy  Risks and benefits discussed  with the patient/spouse including, but not limited to bleeding, infection, damage to adjacent structures or low yield requiring additional tests.  All of the patient's questions were answered, patient is agreeable to proceed. Consent signed and in chart.  LABS PENDING; LD eliquis 11/18   Thank you for this interesting consult.  I greatly enjoyed meeting Cody Lane and look forward to participating in their care.  A copy of this report was sent to the requesting provider on this date.  Electronically Signed: D. Rowe Robert, PA-C 12/02/2017, 12:07 PM   I spent a total of 25 minutes in face to face in clinical consultation, greater than 50% of which was counseling/coordinating care for image guided left supraclavicular lymph node biopsy

## 2017-12-02 NOTE — Procedures (Signed)
US guided core biopsies of left supraclavicular lymph nodes.  Minimal blood loss and no immediate complication.

## 2017-12-02 NOTE — Discharge Instructions (Signed)

## 2017-12-03 ENCOUNTER — Telehealth: Payer: Self-pay | Admitting: Pulmonary Disease

## 2017-12-03 NOTE — Telephone Encounter (Signed)
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   Attempted to contact pt and his wife at listed numbers.  No answer or voicemail option on any of the listed numbers.  Will try calling him again later.

## 2017-12-06 ENCOUNTER — Encounter: Payer: Self-pay | Admitting: Cardiology

## 2017-12-06 ENCOUNTER — Ambulatory Visit (INDEPENDENT_AMBULATORY_CARE_PROVIDER_SITE_OTHER): Payer: Non-veteran care | Admitting: Cardiology

## 2017-12-06 VITALS — BP 104/80 | HR 68 | Ht 72.0 in | Wt 174.0 lb

## 2017-12-06 DIAGNOSIS — I723 Aneurysm of iliac artery: Secondary | ICD-10-CM | POA: Diagnosis not present

## 2017-12-06 DIAGNOSIS — I722 Aneurysm of renal artery: Secondary | ICD-10-CM

## 2017-12-06 DIAGNOSIS — I4821 Permanent atrial fibrillation: Secondary | ICD-10-CM

## 2017-12-06 DIAGNOSIS — I712 Thoracic aortic aneurysm, without rupture, unspecified: Secondary | ICD-10-CM

## 2017-12-06 NOTE — Patient Instructions (Signed)
Medication Instructions:  NO CHANGE If you need a refill on your cardiac medications before your next appointment, please call your pharmacy.   Lab work: Your physician recommends that you HAVE LAB WORK TODAY If you have labs (blood work) drawn today and your tests are completely normal, you will receive your results only by: Marland Kitchen MyChart Message (if you have MyChart) OR . A paper copy in the mail If you have any lab test that is abnormal or we need to change your treatment, we will call you to review the results.  Testing/Procedures: CTA OF THE CHEST/ABDOMEN/PELVIS TO FOLLOW UP ON THORACIC-ILIAC AND RENAL ANEURYSMS  Follow-Up: Your physician recommends that you schedule a follow-up appointment in: [place]

## 2017-12-07 ENCOUNTER — Telehealth: Payer: Self-pay | Admitting: Cardiology

## 2017-12-07 ENCOUNTER — Ambulatory Visit
Admission: RE | Admit: 2017-12-07 | Discharge: 2017-12-07 | Disposition: A | Discharge status: Home / Self Care | Payer: Medicare Other | Source: Ambulatory Visit | Attending: Neurology | Admitting: Neurology

## 2017-12-07 DIAGNOSIS — H5122 Internuclear ophthalmoplegia, left eye: Secondary | ICD-10-CM

## 2017-12-07 DIAGNOSIS — I63232 Cerebral infarction due to unspecified occlusion or stenosis of left carotid arteries: Secondary | ICD-10-CM

## 2017-12-07 DIAGNOSIS — I639 Cerebral infarction, unspecified: Secondary | ICD-10-CM

## 2017-12-07 LAB — BASIC METABOLIC PANEL
BUN / CREAT RATIO: 15 (ref 10–24)
BUN: 19 mg/dL (ref 8–27)
CALCIUM: 8.6 mg/dL (ref 8.6–10.2)
CHLORIDE: 101 mmol/L (ref 96–106)
CO2: 22 mmol/L (ref 20–29)
Creatinine, Ser: 1.3 mg/dL — ABNORMAL HIGH (ref 0.76–1.27)
GFR calc Af Amer: 61 mL/min/{1.73_m2} (ref >59)
GFR calc non Af Amer: 53 mL/min/{1.73_m2} — ABNORMAL LOW (ref >59)
GLUCOSE: 105 mg/dL — AB (ref 65–99)
POTASSIUM: 3.9 mmol/L (ref 3.5–5.2)
Sodium: 141 mmol/L (ref 134–144)

## 2017-12-07 LAB — TSH: TSH: 1.65 u[IU]/mL (ref 0.450–4.500)

## 2017-12-07 MED ORDER — IOPAMIDOL (ISOVUE-370) INJECTION 76%
75.0000 mL | Freq: Once | INTRAVENOUS | Status: AC | PRN
  Administered 2017-12-07: 75 mL via INTRAVENOUS

## 2017-12-08 ENCOUNTER — Telehealth: Payer: Self-pay

## 2017-12-08 NOTE — Telephone Encounter (Signed)
-----   Message from Pieter Partridge, DO sent at 12/07/2017  4:14 PM EST ----- CTA reveals findings in the carotid arteries.  It is uncertain what the findings are.  I would like to refer him to vascular surgery for further evaluation.

## 2017-12-08 NOTE — Telephone Encounter (Signed)
Medical records requested from [facility] 12/07/17 vlm

## 2017-12-08 NOTE — Telephone Encounter (Signed)
Called and spoke with Cody Lane, advised her of recommendation and will send referral after the holiday. She was agreeable to that.

## 2017-12-14 ENCOUNTER — Telehealth: Payer: Self-pay | Admitting: Cardiology

## 2017-12-14 ENCOUNTER — Telehealth: Payer: Self-pay | Admitting: Neurology

## 2017-12-14 NOTE — Telephone Encounter (Signed)
Medical records received from [facility] 12/14/17 vlm

## 2017-12-14 NOTE — Telephone Encounter (Signed)
Faxing note

## 2017-12-14 NOTE — Telephone Encounter (Signed)
Tabitha from the [place] called and she is needing the last office visit note 11/16/2017  faxed to [phone]. She said you can make it attention to [place]. Thanks

## 2017-12-15 ENCOUNTER — Encounter: Payer: Self-pay | Admitting: Pulmonary Disease

## 2017-12-15 ENCOUNTER — Ambulatory Visit (INDEPENDENT_AMBULATORY_CARE_PROVIDER_SITE_OTHER): Payer: No Typology Code available for payment source | Admitting: Pulmonary Disease

## 2017-12-15 VITALS — BP 118/72 | HR 79 | Ht 72.0 in | Wt 173.8 lb

## 2017-12-15 DIAGNOSIS — C3490 Malignant neoplasm of unspecified part of unspecified bronchus or lung: Secondary | ICD-10-CM

## 2017-12-15 DIAGNOSIS — I63232 Cerebral infarction due to unspecified occlusion or stenosis of left carotid arteries: Secondary | ICD-10-CM

## 2017-12-15 DIAGNOSIS — J432 Centrilobular emphysema: Secondary | ICD-10-CM

## 2017-12-15 NOTE — Progress Notes (Signed)
[facility]  Chief Complaint  Patient presents with  . Follow-up    Pt has increase SOB and wheezing more in the last week.    Constitutional:  BP 118/72 (BP Location: Left Arm, Cuff Size: Normal)   Pulse 79   Ht 6' (1.829 m)   Wt 173 lb 12.8 oz (78.8 kg)   SpO2 90%   BMI 23.57 kg/m   Past Medical History:  S/p AVR, Cataract, CVA, Systolic CHF, HTN, Vit D deficiency, HLD, A fib  Brief Summary:  Cody Lane is a 77 y.o. male former smoker with NSCLC, and COPD with emphysema.  He is a English as a second language teacher.  Since his last visit last visit he had PET scan and then had Lt [place] lymph node biopsy.  This showed Squamous cell carcinoma (NSCLC).  He has noticed his breathing has been worse since his inhalers were changed.  He is getting occasional wheeze and short of breath.  He is not having sputum or hemoptysis.  He has not had fever.    He is followed by Dr. Loney Hering in [place] [phone], ext [phone]) and Dr. Tommy Medal at the [place] [phone] ext [phone])   Physical Exam:   Appearance - thin, sitting in wheelchair  ENMT - no sinus tenderness, no nasal discharge, no oral exudate  Neck - no masses, trachea midline, no thyromegaly, no elevation in JVP  Respiratory - normal appearance of chest wall, normal respiratory effort w/o accessory muscle use, no dullness on percussion, no tactile fremitus, no wheezing or rales  CV - irregular, 2/6 systolic murmur, no peripheral edema, no varicosities, radial pulses symmetric  GI - soft, non tender, no masses  Lymph - Lt [place] LAN  MSK - sitting in wheel chair  Ext - no cyanosis, clubbing, or joint inflammation noted  Skin - no rashes, lesions, or ulcers  Neuro - normal strength, oriented x 3  Psych - normal mood and affect   Assessment/Plan:   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 - biopsy results reviewed with patient - will need to arrange for referral  to oncology; will need to determine if [facility]  Severe centrilobular and paraseptal emphysema. - he would likely benefit from being on maintenance inhaler therapy - he will discuss with [facility] providers about this - spiriva could be a good option to start with  A fib, s/p AVR, dilated thoracic aorta, chronic systolic CHF. - f/u with cardiology  Embolic CVA. - f/u with neurology   Patient Instructions  Will arrange for referral to oncology  Ask the doctors at the [facility] about trying spiriva for your emphysema  Follow up in 2 months    Cody Mires, MD [place] Pager: [phone] 12/15/2017, 5:15 PM  Flow Sheet     Pulmonary tests:  CT chest 10/17/17 >> 5 cm ascending thoracic aorta, main PA 4 cm, 2 cm Lt [place] node, 2.2 cm Rt paratracheal node, 2 cm subcarinal node, 2.3 cm Rt hilar node, central lt lower lobe 4.5 cm mass with narrowing of bronchus, severe centrilobular and paraseptal emphysema, scattered nodules PET scan 11/17/17 >> 4.7 cm Lt hilar mass 15.2 SUV; b/l [place], Rt paratracheal, Lt paratracheal, paraesophageal, subcarinal, AP window, and Rt hilar LAN with increased uptake Lt supraclavicular lymph node biopsy 12/02/17 >> squamous cell carcinoma.   Cardiac tests:  Echo 10/16/17 >> EF 45 to 50%, bioprosthetic AV, 51 mm ascending aorta,  mild MR, PAS 33 mmHg  Medications:   Allergies as of 12/15/2017   No Known Allergies     Medication List        Accurate as of 12/15/17  5:15 PM. Always use your most recent med list.          albuterol 108 (90 Base) MCG/ACT inhaler Commonly known as:  PROVENTIL HFA;VENTOLIN HFA Inhale 2 puffs into the lungs every 6 (six) hours as needed for wheezing or shortness of breath.   amLODipine 10 MG tablet Commonly known as:  NORVASC Take 10 mg by mouth daily.   apixaban 5 MG Tabs tablet Commonly known as:  ELIQUIS Take 1 tablet (5 mg total) by mouth 2 (two) times daily.     atorvastatin 40 MG tablet Commonly known as:  LIPITOR Take 40 mg by mouth daily.   furosemide 20 MG tablet Commonly known as:  LASIX Take 20 mg by mouth daily.   gabapentin 400 MG capsule Commonly known as:  NEURONTIN Take 400 mg by mouth 3 (three) times daily.   loratadine 10 MG tablet Commonly known as:  CLARITIN Take 10 mg by mouth daily.   metoprolol tartrate 25 MG tablet Commonly known as:  LOPRESSOR Take 37.5 mg by mouth 2 (two) times daily.   multivitamin with minerals tablet Take 1 tablet by mouth daily.   tamsulosin 0.4 MG Caps capsule Commonly known as:  FLOMAX Take 0.4 mg by mouth 2 (two) times daily after a meal.       Past Surgical History:  He  has a past surgical history that includes Aortic valve replacement.  Family History:  His family history includes Stroke in his father.  Social History:  He  reports that he has quit smoking. He has never used smokeless tobacco. He reports that he drinks alcohol. He reports that he does not use drugs.

## 2017-12-15 NOTE — Telephone Encounter (Signed)
Pt seen in office on 12/15/17.

## 2017-12-15 NOTE — Patient Instructions (Signed)
Will arrange for referral to oncology  Ask the doctors at the [facility] about trying spiriva for your emphysema  Follow up in 2 months

## 2017-12-23 ENCOUNTER — Telehealth: Payer: Self-pay | Admitting: Cardiology

## 2017-12-23 NOTE — Telephone Encounter (Signed)
Received records from [facility] on 12/23/17, Appt on 03/14/18 @ 1:20PM. [place]

## 2017-12-24 ENCOUNTER — Ambulatory Visit (INDEPENDENT_AMBULATORY_CARE_PROVIDER_SITE_OTHER)
Admission: RE | Admit: 2017-12-24 | Discharge: 2017-12-24 | Disposition: A | Discharge status: Home / Self Care | Payer: No Typology Code available for payment source | Source: Ambulatory Visit | Attending: Cardiology | Admitting: Cardiology

## 2017-12-24 DIAGNOSIS — I712 Thoracic aortic aneurysm, without rupture, unspecified: Secondary | ICD-10-CM

## 2017-12-24 DIAGNOSIS — I722 Aneurysm of renal artery: Secondary | ICD-10-CM | POA: Diagnosis not present

## 2017-12-24 DIAGNOSIS — I723 Aneurysm of iliac artery: Secondary | ICD-10-CM | POA: Diagnosis not present

## 2017-12-24 MED ORDER — IOPAMIDOL (ISOVUE-370) INJECTION 76%
100.0000 mL | Freq: Once | INTRAVENOUS | Status: AC | PRN
  Administered 2017-12-24: 100 mL via INTRAVENOUS

## 2018-01-18 ENCOUNTER — Ambulatory Visit: Payer: Non-veteran care | Admitting: Occupational Therapy

## 2018-01-18 ENCOUNTER — Ambulatory Visit: Payer: Non-veteran care | Admitting: Physical Therapy

## 2018-01-20 ENCOUNTER — Inpatient Hospital Stay (HOSPITAL_COMMUNITY)
Admission: EM | Admit: 2018-01-20 | Discharge: 2018-02-12 | DRG: 180 | Disposition: A | Discharge status: Hospice | Payer: No Typology Code available for payment source | Attending: Internal Medicine | Admitting: Internal Medicine

## 2018-01-20 ENCOUNTER — Other Ambulatory Visit: Payer: Self-pay

## 2018-01-20 ENCOUNTER — Encounter (HOSPITAL_COMMUNITY): Payer: Self-pay | Admitting: *Deleted

## 2018-01-20 ENCOUNTER — Emergency Department (HOSPITAL_COMMUNITY): Payer: No Typology Code available for payment source

## 2018-01-20 DIAGNOSIS — R0600 Dyspnea, unspecified: Secondary | ICD-10-CM

## 2018-01-20 DIAGNOSIS — Z9841 Cataract extraction status, right eye: Secondary | ICD-10-CM

## 2018-01-20 DIAGNOSIS — I10 Essential (primary) hypertension: Secondary | ICD-10-CM | POA: Diagnosis not present

## 2018-01-20 DIAGNOSIS — J90 Pleural effusion, not elsewhere classified: Secondary | ICD-10-CM

## 2018-01-20 DIAGNOSIS — R64 Cachexia: Secondary | ICD-10-CM | POA: Diagnosis present

## 2018-01-20 DIAGNOSIS — R0902 Hypoxemia: Secondary | ICD-10-CM

## 2018-01-20 DIAGNOSIS — R410 Disorientation, unspecified: Secondary | ICD-10-CM | POA: Diagnosis not present

## 2018-01-20 DIAGNOSIS — Z7951 Long term (current) use of inhaled steroids: Secondary | ICD-10-CM

## 2018-01-20 DIAGNOSIS — R5381 Other malaise: Secondary | ICD-10-CM

## 2018-01-20 DIAGNOSIS — Z79899 Other long term (current) drug therapy: Secondary | ICD-10-CM

## 2018-01-20 DIAGNOSIS — E785 Hyperlipidemia, unspecified: Secondary | ICD-10-CM | POA: Diagnosis not present

## 2018-01-20 DIAGNOSIS — R0603 Acute respiratory distress: Secondary | ICD-10-CM

## 2018-01-20 DIAGNOSIS — R531 Weakness: Secondary | ICD-10-CM

## 2018-01-20 DIAGNOSIS — J441 Chronic obstructive pulmonary disease with (acute) exacerbation: Secondary | ICD-10-CM | POA: Diagnosis not present

## 2018-01-20 DIAGNOSIS — N179 Acute kidney failure, unspecified: Secondary | ICD-10-CM | POA: Diagnosis not present

## 2018-01-20 DIAGNOSIS — E870 Hyperosmolality and hypernatremia: Secondary | ICD-10-CM | POA: Diagnosis not present

## 2018-01-20 DIAGNOSIS — C3490 Malignant neoplasm of unspecified part of unspecified bronchus or lung: Secondary | ICD-10-CM | POA: Diagnosis not present

## 2018-01-20 DIAGNOSIS — J189 Pneumonia, unspecified organism: Secondary | ICD-10-CM | POA: Diagnosis present

## 2018-01-20 DIAGNOSIS — R1312 Dysphagia, oropharyngeal phase: Secondary | ICD-10-CM | POA: Diagnosis present

## 2018-01-20 DIAGNOSIS — Z961 Presence of intraocular lens: Secondary | ICD-10-CM | POA: Diagnosis present

## 2018-01-20 DIAGNOSIS — Z87891 Personal history of nicotine dependence: Secondary | ICD-10-CM

## 2018-01-20 DIAGNOSIS — I110 Hypertensive heart disease with heart failure: Secondary | ICD-10-CM | POA: Diagnosis present

## 2018-01-20 DIAGNOSIS — Z955 Presence of coronary angioplasty implant and graft: Secondary | ICD-10-CM

## 2018-01-20 DIAGNOSIS — Z6821 Body mass index (BMI) 21.0-21.9, adult: Secondary | ICD-10-CM

## 2018-01-20 DIAGNOSIS — I351 Nonrheumatic aortic (valve) insufficiency: Secondary | ICD-10-CM | POA: Diagnosis not present

## 2018-01-20 DIAGNOSIS — Z66 Do not resuscitate: Secondary | ICD-10-CM | POA: Diagnosis not present

## 2018-01-20 DIAGNOSIS — Z515 Encounter for palliative care: Secondary | ICD-10-CM

## 2018-01-20 DIAGNOSIS — G92 Toxic encephalopathy: Secondary | ICD-10-CM | POA: Diagnosis present

## 2018-01-20 DIAGNOSIS — J439 Emphysema, unspecified: Secondary | ICD-10-CM | POA: Diagnosis present

## 2018-01-20 DIAGNOSIS — R0682 Tachypnea, not elsewhere classified: Secondary | ICD-10-CM

## 2018-01-20 DIAGNOSIS — E440 Moderate protein-calorie malnutrition: Secondary | ICD-10-CM | POA: Diagnosis present

## 2018-01-20 DIAGNOSIS — Z8673 Personal history of transient ischemic attack (TIA), and cerebral infarction without residual deficits: Secondary | ICD-10-CM

## 2018-01-20 DIAGNOSIS — Z7189 Other specified counseling: Secondary | ICD-10-CM | POA: Diagnosis not present

## 2018-01-20 DIAGNOSIS — I6350 Cerebral infarction due to unspecified occlusion or stenosis of unspecified cerebral artery: Secondary | ICD-10-CM | POA: Diagnosis not present

## 2018-01-20 DIAGNOSIS — J910 Malignant pleural effusion: Secondary | ICD-10-CM | POA: Diagnosis present

## 2018-01-20 DIAGNOSIS — J440 Chronic obstructive pulmonary disease with acute lower respiratory infection: Secondary | ICD-10-CM | POA: Diagnosis not present

## 2018-01-20 DIAGNOSIS — Z8701 Personal history of pneumonia (recurrent): Secondary | ICD-10-CM

## 2018-01-20 DIAGNOSIS — C3432 Malignant neoplasm of lower lobe, left bronchus or lung: Secondary | ICD-10-CM | POA: Diagnosis present

## 2018-01-20 DIAGNOSIS — Z8249 Family history of ischemic heart disease and other diseases of the circulatory system: Secondary | ICD-10-CM

## 2018-01-20 DIAGNOSIS — F17210 Nicotine dependence, cigarettes, uncomplicated: Secondary | ICD-10-CM | POA: Diagnosis present

## 2018-01-20 DIAGNOSIS — R1084 Generalized abdominal pain: Secondary | ICD-10-CM | POA: Diagnosis not present

## 2018-01-20 DIAGNOSIS — J9621 Acute and chronic respiratory failure with hypoxia: Secondary | ICD-10-CM | POA: Diagnosis present

## 2018-01-20 DIAGNOSIS — C3482 Malignant neoplasm of overlapping sites of left bronchus and lung: Secondary | ICD-10-CM | POA: Diagnosis not present

## 2018-01-20 DIAGNOSIS — E873 Alkalosis: Secondary | ICD-10-CM | POA: Diagnosis present

## 2018-01-20 DIAGNOSIS — T40605A Adverse effect of unspecified narcotics, initial encounter: Secondary | ICD-10-CM | POA: Diagnosis not present

## 2018-01-20 DIAGNOSIS — I480 Paroxysmal atrial fibrillation: Secondary | ICD-10-CM

## 2018-01-20 DIAGNOSIS — C779 Secondary and unspecified malignant neoplasm of lymph node, unspecified: Secondary | ICD-10-CM | POA: Diagnosis present

## 2018-01-20 DIAGNOSIS — Y92230 Patient room in hospital as the place of occurrence of the external cause: Secondary | ICD-10-CM | POA: Diagnosis not present

## 2018-01-20 DIAGNOSIS — J9611 Chronic respiratory failure with hypoxia: Secondary | ICD-10-CM

## 2018-01-20 DIAGNOSIS — R0789 Other chest pain: Secondary | ICD-10-CM

## 2018-01-20 DIAGNOSIS — Z9889 Other specified postprocedural states: Secondary | ICD-10-CM

## 2018-01-20 DIAGNOSIS — R079 Chest pain, unspecified: Secondary | ICD-10-CM | POA: Insufficient documentation

## 2018-01-20 DIAGNOSIS — Z9981 Dependence on supplemental oxygen: Secondary | ICD-10-CM

## 2018-01-20 DIAGNOSIS — I5023 Acute on chronic systolic (congestive) heart failure: Secondary | ICD-10-CM

## 2018-01-20 DIAGNOSIS — N170 Acute kidney failure with tubular necrosis: Secondary | ICD-10-CM | POA: Diagnosis present

## 2018-01-20 DIAGNOSIS — R0602 Shortness of breath: Secondary | ICD-10-CM | POA: Diagnosis present

## 2018-01-20 DIAGNOSIS — Z952 Presence of prosthetic heart valve: Secondary | ICD-10-CM | POA: Diagnosis not present

## 2018-01-20 DIAGNOSIS — Z9114 Patient's other noncompliance with medication regimen: Secondary | ICD-10-CM

## 2018-01-20 DIAGNOSIS — Z7901 Long term (current) use of anticoagulants: Secondary | ICD-10-CM | POA: Diagnosis not present

## 2018-01-20 DIAGNOSIS — Z9842 Cataract extraction status, left eye: Secondary | ICD-10-CM

## 2018-01-20 DIAGNOSIS — I252 Old myocardial infarction: Secondary | ICD-10-CM

## 2018-01-20 DIAGNOSIS — R471 Dysarthria and anarthria: Secondary | ICD-10-CM | POA: Diagnosis present

## 2018-01-20 DIAGNOSIS — Z823 Family history of stroke: Secondary | ICD-10-CM

## 2018-01-20 DIAGNOSIS — J9601 Acute respiratory failure with hypoxia: Secondary | ICD-10-CM | POA: Diagnosis not present

## 2018-01-20 DIAGNOSIS — I959 Hypotension, unspecified: Secondary | ICD-10-CM | POA: Diagnosis not present

## 2018-01-20 DIAGNOSIS — I69321 Dysphasia following cerebral infarction: Secondary | ICD-10-CM

## 2018-01-20 HISTORY — DX: Cardiac murmur, unspecified: R01.1

## 2018-01-20 HISTORY — DX: Acute myocardial infarction, unspecified: I21.9

## 2018-01-20 HISTORY — DX: Malignant neoplasm of unspecified part of unspecified bronchus or lung: C34.90

## 2018-01-20 HISTORY — DX: Chronic obstructive pulmonary disease, unspecified: J44.9

## 2018-01-20 HISTORY — DX: Dependence on supplemental oxygen: Z99.81

## 2018-01-20 HISTORY — DX: Pneumonia, unspecified organism: J18.9

## 2018-01-20 LAB — I-STAT CG4 LACTIC ACID, ED: Lactic Acid, Venous: 1.29 mmol/L (ref 0.5–1.9)

## 2018-01-20 LAB — CBC
HCT: 36.9 % — ABNORMAL LOW (ref 39.0–52.0)
Hemoglobin: 11.3 g/dL — ABNORMAL LOW (ref 13.0–17.0)
MCH: 29.9 pg (ref 26.0–34.0)
MCHC: 30.6 g/dL (ref 30.0–36.0)
MCV: 97.6 fL (ref 80.0–100.0)
PLATELETS: 125 10*3/uL — AB (ref 150–400)
RBC: 3.78 MIL/uL — ABNORMAL LOW (ref 4.22–5.81)
RDW: 13.6 % (ref 11.5–15.5)
WBC: 9.9 10*3/uL (ref 4.0–10.5)
nRBC: 0 % (ref 0.0–0.2)

## 2018-01-20 LAB — BASIC METABOLIC PANEL
Anion gap: 7 (ref 5–15)
BUN: 26 mg/dL — AB (ref 8–23)
CALCIUM: 8.6 mg/dL — AB (ref 8.9–10.3)
CO2: 32 mmol/L (ref 22–32)
CREATININE: 1.63 mg/dL — AB (ref 0.61–1.24)
Chloride: 101 mmol/L (ref 98–111)
GFR calc Af Amer: 46 mL/min — ABNORMAL LOW (ref >60)
GFR, EST NON AFRICAN AMERICAN: 40 mL/min — AB (ref >60)
GLUCOSE: 103 mg/dL — AB (ref 70–99)
Potassium: 3.6 mmol/L (ref 3.5–5.1)
Sodium: 140 mmol/L (ref 135–145)

## 2018-01-20 LAB — I-STAT TROPONIN, ED: TROPONIN I, POC: 0.06 ng/mL (ref 0.00–0.08)

## 2018-01-20 LAB — BRAIN NATRIURETIC PEPTIDE: B Natriuretic Peptide: 420.4 pg/mL — ABNORMAL HIGH (ref 0.0–100.0)

## 2018-01-20 LAB — TROPONIN I
TROPONIN I: 0.05 ng/mL — AB (ref <0.03)
Troponin I: 0.04 ng/mL (ref <0.03)
Troponin I: 0.03 ng/mL (ref <0.03)

## 2018-01-20 MED ORDER — ATORVASTATIN CALCIUM 40 MG PO TABS
40.0000 mg | ORAL_TABLET | Freq: Every day | ORAL | Status: DC
  Administered 2018-01-20 – 2018-02-01 (×12): 40 mg via ORAL
  Filled 2018-01-20 (×15): qty 1

## 2018-01-20 MED ORDER — FUROSEMIDE 40 MG PO TABS: 40.0000 mg | ORAL_TABLET | Freq: Two times a day (BID) | ORAL | Status: DC

## 2018-01-20 MED ORDER — LISINOPRIL 2.5 MG PO TABS: 2.5000 mg | ORAL_TABLET | ORAL | Status: DC

## 2018-01-20 MED ORDER — CEFDINIR 300 MG PO CAPS
300.0000 mg | ORAL_CAPSULE | Freq: Two times a day (BID) | ORAL | Status: DC
  Administered 2018-01-20 – 2018-01-21 (×2): 300 mg via ORAL
  Filled 2018-01-20 (×5): qty 1

## 2018-01-20 MED ORDER — FUROSEMIDE 10 MG/ML IJ SOLN
40.0000 mg | Freq: Every day | INTRAMUSCULAR | Status: DC
  Administered 2018-01-21: 40 mg via INTRAVENOUS
  Filled 2018-01-20: qty 4

## 2018-01-20 MED ORDER — ENSURE ENLIVE PO LIQD: 237.0000 mL | Freq: Two times a day (BID) | ORAL | Status: DC

## 2018-01-20 MED ORDER — AMLODIPINE BESYLATE 10 MG PO TABS
10.0000 mg | ORAL_TABLET | Freq: Every day | ORAL | Status: DC
  Administered 2018-01-20 – 2018-01-22 (×2): 10 mg via ORAL
  Filled 2018-01-20 (×4): qty 1

## 2018-01-20 MED ORDER — ACETAMINOPHEN 650 MG RE SUPP
650.0000 mg | Freq: Four times a day (QID) | RECTAL | Status: DC | PRN
  Administered 2018-01-28 – 2018-01-31 (×2): 650 mg via RECTAL
  Filled 2018-01-20 (×2): qty 1

## 2018-01-20 MED ORDER — HEPARIN SODIUM (PORCINE) 5000 UNIT/ML IJ SOLN: 5000.0000 [IU] | Freq: Three times a day (TID) | INTRAMUSCULAR | Status: DC

## 2018-01-20 MED ORDER — APIXABAN 5 MG PO TABS
5.0000 mg | ORAL_TABLET | Freq: Two times a day (BID) | ORAL | Status: DC
  Administered 2018-01-20 – 2018-02-11 (×35): 5 mg via ORAL
  Filled 2018-01-20 (×44): qty 1

## 2018-01-20 MED ORDER — ORAL CARE MOUTH RINSE
15.0000 mL | Freq: Two times a day (BID) | OROMUCOSAL | Status: DC
  Administered 2018-01-22 – 2018-02-11 (×17): 15 mL via OROMUCOSAL

## 2018-01-20 MED ORDER — SODIUM CHLORIDE 0.9 % IV BOLUS
500.0000 mL | Freq: Once | INTRAVENOUS | Status: AC
  Administered 2018-01-20: 500 mL via INTRAVENOUS

## 2018-01-20 MED ORDER — LORATADINE 10 MG PO TABS
10.0000 mg | ORAL_TABLET | Freq: Every day | ORAL | Status: DC
  Administered 2018-01-20 – 2018-02-01 (×12): 10 mg via ORAL
  Filled 2018-01-20 (×15): qty 1

## 2018-01-20 MED ORDER — FUROSEMIDE 10 MG/ML IJ SOLN
40.0000 mg | Freq: Once | INTRAMUSCULAR | Status: AC
  Administered 2018-01-20: 40 mg via INTRAVENOUS
  Filled 2018-01-20: qty 4

## 2018-01-20 MED ORDER — ALBUTEROL SULFATE (2.5 MG/3ML) 0.083% IN NEBU: 2.5000 mg | INHALATION_SOLUTION | Freq: Four times a day (QID) | RESPIRATORY_TRACT | Status: DC | PRN

## 2018-01-20 MED ORDER — DILTIAZEM HCL 60 MG PO TABS
60.0000 mg | ORAL_TABLET | Freq: Two times a day (BID) | ORAL | Status: DC
  Administered 2018-01-20 – 2018-01-22 (×4): 60 mg via ORAL
  Filled 2018-01-20 (×6): qty 1

## 2018-01-20 MED ORDER — SENNOSIDES-DOCUSATE SODIUM 8.6-50 MG PO TABS: 1.0000 | ORAL_TABLET | Freq: Every evening | ORAL | Status: DC | PRN

## 2018-01-20 MED ORDER — GABAPENTIN 400 MG PO CAPS
400.0000 mg | ORAL_CAPSULE | Freq: Two times a day (BID) | ORAL | Status: DC
  Administered 2018-01-20 – 2018-02-02 (×21): 400 mg via ORAL
  Filled 2018-01-20 (×30): qty 1

## 2018-01-20 MED ORDER — DILTIAZEM HCL 60 MG PO TABS: 60.0000 mg | ORAL_TABLET | Freq: Two times a day (BID) | ORAL | Status: DC

## 2018-01-20 MED ORDER — CEFDINIR 300 MG PO CAPS: 300.0000 mg | ORAL_CAPSULE | Freq: Two times a day (BID) | ORAL | Status: DC

## 2018-01-20 MED ORDER — ACETAMINOPHEN 325 MG PO TABS: 650.0000 mg | ORAL_TABLET | Freq: Four times a day (QID) | ORAL | Status: DC | PRN

## 2018-01-20 MED ORDER — FUROSEMIDE 40 MG PO TABS: 40.0000 mg | ORAL_TABLET | Freq: Every day | ORAL | Status: DC

## 2018-01-20 MED ORDER — LISINOPRIL 2.5 MG PO TABS
2.5000 mg | ORAL_TABLET | ORAL | Status: DC
  Administered 2018-01-20 – 2018-01-25 (×4): 2.5 mg via ORAL
  Filled 2018-01-20 (×6): qty 1

## 2018-01-20 MED ORDER — METOPROLOL TARTRATE 25 MG PO TABS: 37.5000 mg | ORAL_TABLET | Freq: Two times a day (BID) | ORAL | Status: DC

## 2018-01-20 NOTE — ED Notes (Signed)
Pt. Wife stated that pt. Is very weak and unable to ambulate with steady gait. She also stated "pt. Is so weak that when he laid down he was unable to move his head."

## 2018-01-20 NOTE — ED Provider Notes (Signed)
Emergency Department Provider Note   I have reviewed the triage vital signs and the nursing notes.   HISTORY  Chief Complaint Chest Pain   HPI Cody Lane is a 78 y.o. male w/ multiple medical problemsas documented below.  Seems like the patient has a history of worsening lung cancer he is unclear of whether treatment is for that at this point.  He is recently admitted to [facility] secondary to committee acquired pneumonia and was discharged yesterday.  He was discharged on home oxygen.  Also has a history of atrial fibrillation for which he is on Cardizem and metoprolol and Eliquis.  Is also on Lasix appears like from the medical records.  Patient states that he was doing fine however this morning he woke up to go to the bathroom and he started feeling palpitations and get a little bit short of breath.  He did not have his oxygen on but thought he might be having a heart attack.  Per the nursing note he took a BC powder which seemed to help his symptoms.  His palpitations went away.  Patient states he had chest pain and his heart beating fast and irregular. Had some sob as well. Nothing took his medications yet this morning as his wife is one that gives him his medicines.  At this time he is asymptomatic and says he is ready for discharge.    No other associated or modifying symptoms.    Past Medical History:  Diagnosis Date  . Aortic valve regurgitation 01/26/14  . Atrial fibrillation ([place])   . Cataract 08/17/2013  . 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 CHF (congestive heart failure) ([place]) 01/26/2014  . CVA (cerebral vascular accident) ([place])   . Emphysema (subcutaneous) (surgical) resulting from a procedure   . Hypertension   . 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 Lung mass   . Mitral valve regurgitation 01/26/2014  . Pseudophakia 08/17/2013  . 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 Vitamin D deficiency     Patient Active Problem List   Diagnosis Date  Noted  . Vertigo 10/15/2017  . Diplopia 10/15/2017  . Cough   . Aortic valve insufficiency S/P aortic valve replacement 03/26/2014  . Kidney mass 03/26/2014  . Essential hypertension 03/26/2014  . Unspecified atrial fibrillation ([place]) 03/26/2014  . Long term current use of anticoagulant therapy 03/26/2014  . Hyperlipidemia 03/26/2014  . CHF (congestive heart failure) ([place]) 03/26/2014  . Insomnia 03/26/2014    Past Surgical History:  Procedure Laterality Date  . AORTIC VALVE REPLACEMENT      Current Outpatient Rx  . Order #: [id number] Class: Historical Med  . Order #: [id number] Class: Historical Med  . Order #: [id number] Class: Historical Med  . Order #: [id number] Class: Print  . Order #: [id number] Class: Historical Med  . Order #: [id number] Class: Historical Med  . Order #: [id number] Class: Historical Med  . Order #: [id number] Class: Historical Med  . Order #: [id number] Class: Historical Med  . Order #: [id number] Class: Historical Med  . Order #: [id number] Class: Historical Med  . Order #: [id number] Class: Historical Med  . Order #: [id number] Class: Historical Med  . Order #: [id number] Class: Historical Med  . Order #: [id number] Class: Historical Med    Allergies Patient has no known allergies.  Family History  Problem Relation Age of Onset  . Stroke Father     Social History Social History   Tobacco Use  . Smoking status: Former Research scientist (life sciences)  . Smokeless tobacco: Never  Used  Substance Use Topics  . Alcohol use: Yes    Alcohol/week: 0.0 standard drinks  . Drug use: No    Review of Systems  All other systems negative except as documented in the HPI. All pertinent positives and negatives as reviewed in the HPI. ____________________________________________   PHYSICAL EXAM:  VITAL SIGNS: ED Triage Vitals  Enc Vitals Group     BP 01/20/18 0736 118/88     Pulse Rate 01/20/18 0736 (!) 109     Resp 01/20/18 0736 20     Temp 01/20/18 0732 98.4 F (36.9 C)     Temp Source  01/20/18 0732 Oral     SpO2 01/20/18 0736 95 %    Constitutional: Alert and oriented to self and situation. Well appearing and in no acute distress. Eyes: Conjunctivae are normal. PERRL. EOMI. Head: Atraumatic. Nose: No congestion/rhinnorhea. Mouth/Throat: Mucous membranes are moist.  Oropharynx non-erythematous. Neck: No stridor.  No meningeal signs.   Cardiovascular: Normal rate, irregular rhythm. Good peripheral circulation. Grossly normal heart sounds.   Respiratory: Normal respiratory effort.  No retractions. Lungs with absent BS on left, normal on right. Gastrointestinal: Soft and nontender. No distention.  Musculoskeletal: No lower extremity tenderness nor edema. No gross deformities of extremities. Neurologic:  Normal speech and language. No gross focal neurologic deficits are appreciated.  Skin:  Skin is warm, dry and intact. No rash noted.   ____________________________________________   LABS (all labs ordered are listed, but only abnormal results are displayed)  Labs Reviewed  BASIC METABOLIC PANEL - Abnormal; Notable for the following components:      Result Value   Glucose, Bld 103 (*)    BUN 26 (*)    Creatinine, Ser 1.63 (*)    Calcium 8.6 (*)    GFR calc non Af Amer 40 (*)    GFR calc Af Amer 46 (*)    All other components within normal limits  CBC - Abnormal; Notable for the following components:   RBC 3.78 (*)    Hemoglobin 11.3 (*)    HCT 36.9 (*)    Platelets 125 (*)    All other components within normal limits  BRAIN NATRIURETIC PEPTIDE - Abnormal; Notable for the following components:   B Natriuretic Peptide 420.4 (*)    All other components within normal limits  TROPONIN I  I-STAT TROPONIN, ED  I-STAT CG4 LACTIC ACID, ED   ____________________________________________  EKG   EKG Interpretation  Date/Time:  Thursday January 20 2018 07:35:35 EST Ventricular Rate:  94 PR Interval:    QRS Duration: 98 QT Interval:  361 QTC Calculation: 452 R  Axis:   49 Text Interpretation:  Atrial fibrillation Minimal ST depression, diffuse leads similar to octoboer Confirmed by Merrily Pew [phone]) on 01/20/2018 8:11:08 AM       ____________________________________________  RADIOLOGY  Dg Chest 2 View  Result Date: 01/20/2018 CLINICAL DATA:  Left chest pain. EXAM: CHEST - 2 VIEW COMPARISON:  12/24/2017 FINDINGS: Previous median sternotomy CABG procedure. Aortic atherosclerosis. Small bilateral pleural effusions and mild diffuse interstitial edema identified. Asymmetric opacification of the left lower lobe is identified which may reflect asymmetric edema, pneumonia or postobstructive pneumonitis secondary to known central hilar mass. IMPRESSION: 1. Cardiac enlargement and CHF. 2. Asymmetric opacification of the left lower lobe which may represent asymmetric edema, pneumonia versus progressive postobstructive pneumonitis secondary to known central left hilar lung mass. Electronically Signed   By: Kerby Moors M.D.   On: 01/20/2018 09:01  ____________________________________________   PROCEDURES  Procedure(s) performed:   Procedures   ____________________________________________   INITIAL IMPRESSION / ASSESSMENT AND PLAN / ED COURSE  Seems a patient symptoms have resolved.  I suspect he had CP/palpitations and shortness of breath secondary to his atrial fibrillation and oxygen requirement with known lung cancer and left pleural effusion.  We will get a chest x-ray and recheck couple labs today shows no acute changes from his discharge yesterday but this time felt the patient could probably be discharged if all these are relatively unchanged.  Workup with worsening fluid overload vs worsening pneumonia. Also with AKI. Not able to ambulate 2/2 sob/generalized weakness. Will need slow diuresis. Plan for observation in hospital to ensure things improve and may need CT to further evaluate pulmonary vasculature/fluid vs pneumonia, etc.      Pertinent labs & imaging results that were available during my care of the patient were reviewed by me and considered in my medical decision making (see chart for details).  ____________________________________________  FINAL CLINICAL IMPRESSION(S) / ED DIAGNOSES  Final diagnoses:  Nonspecific chest pain  SOB (shortness of breath)     MEDICATIONS GIVEN DURING THIS VISIT:  Medications  sodium chloride 0.9 % bolus 500 mL (0 mLs Intravenous Stopped 01/20/18 1052)  furosemide (LASIX) injection 40 mg (40 mg Intravenous Given 01/20/18 1103)     NEW OUTPATIENT MEDICATIONS STARTED DURING THIS VISIT:  New Prescriptions   No medications on file    Note:  This note was prepared with assistance of Dragon voice recognition software. Occasional wrong-word or sound-a-like substitutions may have occurred due to the inherent limitations of voice recognition software.   Merrily Pew, MD 01/20/18 1115

## 2018-01-20 NOTE — ED Notes (Signed)
This RN and NT assisted patient with sitting on side of bed and standing. Pt is unable to take any steps at this time even with walker. Pt requiring assistance to put legs back on bed.

## 2018-01-20 NOTE — ED Notes (Signed)
ED Provider at bedside. 

## 2018-01-20 NOTE — H&P (Addendum)
Date: 01/20/2018               Patient Name:  Cody Lane MRN: [id number]  DOB: Jun 05, 1940 Age / Sex: 78 y.o., male   PCP: [facility] Service: Internal Medicine Teaching Service         Attending Physician: Dr. Heber Moraga, Rachel Moulds, DO    First Contact: Dr. Koleen Distance  Pager: [phone]  Second Contact: Dr. Frederico Hamman Pager: [phone]       After Hours (After 5p/  First Contact Pager: [phone]  weekends / holidays): Second Contact Pager: [phone]   Chief Complaint: Chest pain  History of Present Illness: Cody Lane is a 78 year old male with past medical history of non-small cell lung cancer, chronic systolic congestive heart failure, chronic A. Fib, aortic valve insufficiency s/p aortic valve replacement that presents the ED for acute onset of localized left sided chest pain when taking off his home oxygen to go to the bathroom.  He states the episode lasted 45 minutes and resolved with BC powder.  He had associated symptoms of palipations with the episode. Patient was recently discharged from [facility] on 01/17/2018 for acute respiratory failure with hypoxia secondary to pneumonia. He was discharged with Cefdinir to finish on 1/10. He was also discharged on home oxygen 2 L.  At this time patient denies changes in respiratory status since hospital discharge.     Meds:  Current Meds  Medication Sig  . albuterol (PROVENTIL HFA;VENTOLIN HFA) 108 (90 BASE) MCG/ACT inhaler Inhale 2 puffs into the lungs every 6 (six) hours as needed for wheezing or shortness of breath.  Marland Kitchen albuterol (PROVENTIL) (2.5 MG/3ML) 0.083% nebulizer solution Take 2.5 mg by nebulization every 6 (six) hours as needed for wheezing or shortness of breath.  Marland Kitchen amLODipine (NORVASC) 10 MG tablet Take 10 mg by mouth daily.  Marland Kitchen apixaban (ELIQUIS) 5 MG TABS tablet Take 1 tablet (5 mg total) by mouth 2 (two) times daily.  Marland Kitchen atorvastatin (LIPITOR) 40 MG tablet Take 40 mg by mouth daily.    . cefdinir (OMNICEF) 300 MG capsule Take 300 mg by mouth 2 (two) times daily. 3 doses remaining  . diltiazem (CARDIZEM) 60 MG tablet Take 60 mg by mouth 2 (two) times daily.  . furosemide (LASIX) 40 MG tablet Take 40 mg by mouth daily.   Marland Kitchen gabapentin (NEURONTIN) 400 MG capsule Take 400 mg by mouth 2 (two) times daily.   Marland Kitchen lisinopril (PRINIVIL,ZESTRIL) 2.5 MG tablet Take 2.5 mg by mouth every morning.  . loratadine (CLARITIN) 10 MG tablet Take 10 mg by mouth daily.  . metoprolol tartrate (LOPRESSOR) 25 MG tablet Take 37.5 mg by mouth 2 (two) times daily.   . Multiple Vitamins-Minerals (MULTIVITAMIN WITH MINERALS) tablet Take 1 tablet by mouth daily.  . ondansetron (ZOFRAN-ODT) 4 MG disintegrating tablet Take 4 mg by mouth every 8 (eight) hours as needed for nausea or vomiting.  . tiotropium (SPIRIVA) 18 MCG inhalation capsule Place 1 capsule into inhaler and inhale 2 (two) times daily.      Allergies: Allergies as of 01/20/2018  . (No Known Allergies)   Past Medical History:  Diagnosis Date  . Aortic valve regurgitation 01/26/14  . Atrial fibrillation ([place])   . 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 CHF (congestive heart failure) ([place]) 01/26/2014  . COPD (chronic obstructive pulmonary disease) ([place])   . CVA (cerebral vascular accident) ([place]) 10/2017   "  left side still weak" (01/20/2018)  . Emphysema (subcutaneous) (surgical) resulting from a procedure   . Heart murmur   . Hypertension   . 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 Lung cancer ([place]) dx'd 12/2017  . Lung mass   . Mitral valve regurgitation 01/26/2014  . Myocardial infarction ([place])    "I've had 2; last 1 was ~ 1 wk ago" (01/20/2018)  . On home oxygen therapy    "2L; 24/7" (01/20/2018)  . Pneumonia    "several times" (01/20/2018)  . Pseudophakia 08/17/2013  . 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 Vitamin D deficiency     Family History:  Hypertension:  Mother and Father   Social History: tobacco use: 45 pack  year smoking history.  Denies current use. Alcohol use: Occasionally has a drink. Last drink was one month ago Illicit drug use: Denies  Review of Systems: A complete ROS was negative except as per HPI.   Physical Exam: Blood pressure (!) 131/101, pulse 93, temperature 98.1 F (36.7 C), temperature source Oral, resp. rate 17, height 6\' 1"  (1.854 m), weight 76.2 kg, SpO2 96 %. Physical Exam  Constitutional: He is oriented to person, place, and time and well-developed, well-nourished, and in no distress.  HENT:  Head: Normocephalic and atraumatic.  Eyes: Pupils are equal, round, and reactive to light.  Neck: Normal range of motion. Neck supple.  Cardiovascular: Normal heart sounds. Exam reveals no gallop and no friction rub.  No murmur heard. Irregularly irregular  Pulmonary/Chest: Effort normal.  Coarse rhonchi throughout all lung fields  Abdominal: Soft. He exhibits no distension. There is no abdominal tenderness. There is no rebound and no guarding.  Musculoskeletal:     Comments: 2+ pitting edema to the knee  Neurological: He is alert and oriented to person, place, and time.  Skin: Skin is warm and dry.    EKG: personally reviewed my interpretation is atrial fibrillation with minimal ST depressions, similar to prior   CXR: personally reviewed my interpretation is left lower lobe opacity  Assessment & Plan by Problem: Principal Problem:   Chest pain Active Problems:   Essential hypertension   Permanent atrial fibrillation   Hyperlipidemia   CHF (congestive heart failure) (HCC)   Non-small cell carcinoma of lung (HCC)   COPD (chronic obstructive pulmonary disease) with emphysema (HCC)  Chest pain Atypical features.  Heart score of 4.  Risk factors include HTN. HLD, previous CVA and 45 pack year smoking history.  Initial troponin elevated at 0.05. Will continue to trend. EKG is without acute ischemic changes.  Will repeat in the morning.  -Trending troponin -EKG in the  morning  Acute on Chronic systolic congestive heart failure Echo on 10/2017 showed an LVEF of 45-50%.  With pitting edema to the knee, chest x-ray findings and elevated bnp will treat for heart failure exacerbation.  He was given a Dose of IV 40 Lasix in the ED.   -S/p one dose of IV 40mg  lasix in the ED - continue IV lasix -strict Is/Os -Daily weights -Continuing lisinopril   AKI Creatinine 1.6, baseline 1.2.  Will likely improve with diuresis. -bmet  Permanent atrial fibrillation Rate controlled. Continuing Eliquis and diltiazem.  Chest pain could be related to a.fib worsening acutely due increased oxygen requirement when walking to the bathroom as he also had palpitations.   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  CT scan on 12/2017 showed a left hilar mass that is occluding the inferior left pulmonary vein and narrowing of distal left bronchus and lobar branches.  Patient had a chest x-ray today that showed asymmetry opacification of the left lower lobe.  Patient at this time is afebrile without leukocytosis. Not concerned for postobstructive pneumonia at this time causing his symptoms.Follows with the [facility] and is scheduled to have chemotherapy but has not started.  Recent history of community-acquired pneumonia Patient required oxygen on discharge from previous hospitalization 1/6. He continues to use 2 L of O2 via nasal cannula and has not required further increase in supplemental oxygen.  He is satting 95%.  Will continue antibiotic course to end tomorrow. -cefdinir   Dispo: Admit patient to Inpatient with expected length of stay greater than 2 midnights.  Signed: Valinda Party, DO 01/20/2018, 4:22 PM  Pager: [phone]

## 2018-01-20 NOTE — ED Notes (Signed)
Ginny Forth, RN notified of trop result

## 2018-01-20 NOTE — ED Notes (Signed)
Pt. Back from xray

## 2018-01-20 NOTE — ED Triage Notes (Signed)
Per EMS- pt went to the restroom this morning at home without his home O2 at 2L. Pt began having left chest pain. Pt took a BC powder. Pt is pain free now. Pt was in the hospital last week for pneumonia. Pt is still taking abx. Pt also has hx of lung cancer. 111/87. 109 hx of afib. 94% on 2L. 20LAC

## 2018-01-21 DIAGNOSIS — J189 Pneumonia, unspecified organism: Secondary | ICD-10-CM

## 2018-01-21 DIAGNOSIS — J9621 Acute and chronic respiratory failure with hypoxia: Secondary | ICD-10-CM

## 2018-01-21 DIAGNOSIS — Z7189 Other specified counseling: Secondary | ICD-10-CM

## 2018-01-21 DIAGNOSIS — R5381 Other malaise: Secondary | ICD-10-CM

## 2018-01-21 DIAGNOSIS — Z515 Encounter for palliative care: Secondary | ICD-10-CM

## 2018-01-21 DIAGNOSIS — I110 Hypertensive heart disease with heart failure: Secondary | ICD-10-CM

## 2018-01-21 DIAGNOSIS — I5023 Acute on chronic systolic (congestive) heart failure: Secondary | ICD-10-CM

## 2018-01-21 DIAGNOSIS — J439 Emphysema, unspecified: Secondary | ICD-10-CM

## 2018-01-21 DIAGNOSIS — E440 Moderate protein-calorie malnutrition: Secondary | ICD-10-CM | POA: Diagnosis present

## 2018-01-21 LAB — BASIC METABOLIC PANEL
Anion gap: 9 (ref 5–15)
BUN: 23 mg/dL (ref 8–23)
CO2: 32 mmol/L (ref 22–32)
CREATININE: 1.52 mg/dL — AB (ref 0.61–1.24)
Calcium: 8.5 mg/dL — ABNORMAL LOW (ref 8.9–10.3)
Chloride: 99 mmol/L (ref 98–111)
GFR calc non Af Amer: 44 mL/min — ABNORMAL LOW (ref >60)
GFR, EST AFRICAN AMERICAN: 50 mL/min — AB (ref >60)
Glucose, Bld: 97 mg/dL (ref 70–99)
Potassium: 3.5 mmol/L (ref 3.5–5.1)
Sodium: 140 mmol/L (ref 135–145)

## 2018-01-21 LAB — CBC
HCT: 38 % — ABNORMAL LOW (ref 39.0–52.0)
Hemoglobin: 11.8 g/dL — ABNORMAL LOW (ref 13.0–17.0)
MCH: 29.4 pg (ref 26.0–34.0)
MCHC: 31.1 g/dL (ref 30.0–36.0)
MCV: 94.8 fL (ref 80.0–100.0)
NRBC: 0 % (ref 0.0–0.2)
Platelets: 140 10*3/uL — ABNORMAL LOW (ref 150–400)
RBC: 4.01 MIL/uL — ABNORMAL LOW (ref 4.22–5.81)
RDW: 13.3 % (ref 11.5–15.5)
WBC: 10.8 10*3/uL — ABNORMAL HIGH (ref 4.0–10.5)

## 2018-01-21 MED ORDER — ADULT MULTIVITAMIN W/MINERALS CH
1.0000 | ORAL_TABLET | Freq: Every day | ORAL | Status: DC
  Administered 2018-01-22 – 2018-01-31 (×6): 1 via ORAL
  Filled 2018-01-21 (×10): qty 1

## 2018-01-21 MED ORDER — ENSURE ENLIVE PO LIQD
237.0000 mL | Freq: Two times a day (BID) | ORAL | Status: DC
  Administered 2018-01-22 – 2018-02-07 (×16): 237 mL via ORAL

## 2018-01-21 NOTE — Progress Notes (Signed)
   Subjective: Cody Lane denies any new episodes of chest pain. Cody Lane does have some SOB with exertion but is relieved with supplemental oxygen.  His wife had many questions specific to his current illness including "why does Cody Lane still have pneumonia?  Had a accumulate so much fluid so fast?  What are his next steps to getting better?" All questions were answered. They are in agreement with having palliative care speak with them about goals of care.  Objective:  Vital signs in last 24 hours: Vitals:   01/21/18 0300 01/21/18 0400 01/21/18 0500 01/21/18 0609  BP: (!) 120/96 (!) 131/97 (!) 129/95   Pulse: (!) 110 (!) 108 (!) 111   Resp: (!) 24 17 (!) 23   Temp:      TempSrc:      SpO2: (!) 86% 93% (!) 77%   Weight:    75 kg  Height:       Physical Exam Vitals signs and nursing note reviewed.  Constitutional:      Appearance: Cody Lane is well-developed.  Cardiovascular:     Rate and Rhythm: Regular rhythm. Tachycardia present.  Pulmonary:     Effort: Pulmonary effort is normal. No respiratory distress.     Comments: Diminished breath sounds at the lung bases. Skin:    General: Skin is warm and dry.  Neurological:     Mental Status: Cody Lane is alert.  Psychiatric:        Mood and Affect: Mood normal.        Behavior: Behavior normal.     Assessment/Plan:  Principal Problem:   Chest pain Active Problems:   Essential hypertension   Permanent atrial fibrillation   Hyperlipidemia   CHF (congestive heart failure) (HCC)   Non-small cell carcinoma of lung (HCC)   COPD (chronic obstructive pulmonary disease) with emphysema (HCC)   Chronic respiratory failure with hypoxia, on home O2 therapy ([place])  78 year old male with recently diagnosed non-small cell lung cancer, COPD with emphysema, HFrEF, A. fib, history of aortic valve who presented with chest pain likely secondary removing his supplemental oxygen with subsequent exertion as well as deconditioning. Cody Lane has been declining over the past  several months after a stroke.  Additionally Cody Lane was diagnosed with non-small cell carcinoma of the lung and underwent chemotherapy in December.  Cody Lane became very confused which prompted the [facility] admission where Cody Lane was diagnosed with pneumonia and started on antibiotics.  Cody Lane has been progressively weak per his wife.  We are treating him for acute on chronic congestive heart failure as well as pneumonia.  Palliative care has been consulted to establish goals of care.  Acute on chronic hypoxic respiratory failure: Likely due to a combination of acute on chronic systolic congestive heart failure, pneumonia, and non-small cell lung cancer. - Continue Lasix 40 mg daily today - Continue Cefdinir 300 mg BID.  We will plan on discontinuing after today's dose. - Follow-up palliative care recommendations  Chest pain: Ischemic work-up negative.  Has now resolved. -Continue to monitor  Paroxysmal atrial fibrillation with RVR - Continue diltiazem -Continue Eliquis  AKI: Likely due to fluid overload secondary to acute on chronic systolic congestive heart failure.  Creatinine remains stable after diuresis. - Continue Lasix per above  Hypertension: Continued home amlodipine, lisinopril, diltiazem, and furosemide.  Blood pressure has remained within normal limits.  We will continue these medications.  Dispo: Anticipated discharge in approximately 1-3 days.   Carroll Sage, MD 01/21/2018, 6:34 AM Pager: [phone]

## 2018-01-21 NOTE — Progress Notes (Signed)
Initial Nutrition Assessment  DOCUMENTATION CODES:   Non-severe (moderate) malnutrition in context of chronic illness  INTERVENTION:   -Continue Ensure Enlive po BID, each supplement provides 350 kcal and 20 grams of protein -Magic cup TID with meals, each supplement provides 290 kcal and 9 grams of protein -MVI with minerals daily -Agree with liberalized regular  NUTRITION DIAGNOSIS:   Moderate Malnutrition related to chronic illness(CHF, COPD, lung cancer) as evidenced by energy intake < or equal to 75% for > or equal to 1 month, mild fat depletion, moderate fat depletion, mild muscle depletion, moderate muscle depletion, percent weight loss.  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 Fib, history of aortic valve replacement.  As noted he was recently discharged 3 days ago after hospitalization for hypoxia and pneumonia as well as acute metabolic encephalopathy due to those conditions.  At that hospitalization he ended up being discharged on oral antibiotics and with home oxygen therapy.  However this morning he removed his oxygen to go to the bathroom in the bathroom he developed left-sided chest pain and felt that he had a "heart trouble" additionally his wife [name] reports to me that she is very concerned about his weakness which also prompted her to bring him in for evaluation.  Pt admitted with chest pain and respiratory failure secondary to COPD exacerbation.  Reviewed I/O's: -790 ml x 24 hours  Spoke with pt and wife at bedside. Wife provided most of the history. Pt has experienced a  general decline in health over the past 3 months; pt with poor appetite and weight loss. Pt notably has been consuming less as a whole, but had "good days and bad days". Pt will sometimes consume large breakfast meals but other times will eat very little. Pt has been favoring sweets lately, such as ice cream and fruit with honey. Wife has been focusing on adding more protein in diet- lunch and dinner meals consist of chicken or shrimp with vegetables. Pt enjoys oatmeal with raisins or biscuit for breakfast.  Pt consumed very little of breakfast, due to not liking choices. Showed pt wife menu and how to call for meal orders; reviewed menu to identify available options.   Pt has experienced a functional decline in health- he is no longer able to walk as he used to. Wife agreeable to palliative care consult to discuss goals of care.   Pt shares that his UBW is around 205#. They have been noticing wt loss, however, unsure how much. Pt has experienced a 9.9% wt loss over the past 3 months, which is significant for time frame.   Discussed importance of good meal and supplement intake to promote healing. Reviewed supplement options on formulary. Pt agreeable to strawberry Ensure and Magic Cup supplements.   Labs reviewed.   NUTRITION - FOCUSED PHYSICAL EXAM:    Most Recent Value  Orbital Region  Mild depletion  Upper Arm Region  Moderate depletion  Thoracic and Lumbar Region  No depletion  Buccal Region  Mild depletion  Temple Region  Moderate depletion  Clavicle Bone Region  Moderate depletion  Clavicle and Acromion  Bone Region  Moderate depletion  Scapular Bone Region  Moderate depletion  Dorsal Hand  Moderate depletion  Patellar Region  Mild depletion  Anterior Thigh Region  Mild depletion  Posterior Calf Region  Mild depletion  Edema (RD Assessment)  Mild  Hair  Reviewed  Eyes  Reviewed  Mouth  Reviewed  Skin  Reviewed  Nails  Reviewed       Diet Order:   Diet Order             Diet regular Room service appropriate? Yes; Fluid consistency: Thin  Diet effective now              EDUCATION NEEDS:   Education needs have been addressed  Skin:  Skin Assessment: Skin Integrity Issues: Skin Integrity Issues:: Other (Comment) Other: MASD groin  Last BM:  01/21/18  Height:   Ht Readings from Last 1 Encounters:  01/20/18 6\' 1"  (1.854 m)    Weight:   Wt Readings from Last 1 Encounters:  01/21/18 75 kg    Ideal Body Weight:  83.6 kg  BMI:  Body mass index is 21.81 kg/m.  Estimated Nutritional Needs:   Kcal:  2250-2450  Protein:  115-130 grams  Fluid:  > 2.2 L    Smt Lokey A. Jimmye Norman, RD, LDN, CDE Pager: [phone] After hours Pager: [phone]

## 2018-01-21 NOTE — Progress Notes (Signed)
PT Cancellation Note  Patient Details Name: Cody Lane MRN: [id number] DOB: 10-31-1940   Cancelled Treatment:    Reason Eval/Treat Not Completed: Fatigue/lethargy limiting ability to participate(pt sleeping, dufficult to arouse.  Pt stated he's too tired to do PT at presnt.  Wife stated he had multiple visitors today and is tired from that.  Will follow. )   Philomena Doheny PT 01/21/2018  Acute Rehabilitation Services Pager [phone] Office [phone]

## 2018-01-21 NOTE — Consult Note (Signed)
Consultation Note Date: 01/21/2018   Patient Name: Cody Lane  DOB: [birth year]  MRN: [id number]  Age / Sex: 78 y.o., male  PCP: [name] Referring Physician: Lucious Groves, DO  Reason for Consultation: Establishing goals of care and Psychosocial/spiritual support  HPI/Patient Profile: 78 y.o. male  with past medical history of COPD, congestive heart failure, A. fib, aortic valve replacement, CVA (just a few months ago), new diagnosis in November 2019 of non-small cell lung cancer admitted on 01/20/2018 with chest pain and shortness of breath.  Patient was just discharged 3 days prior for treatment of pneumonia.  He was found to have a recurrence of pneumonia , acute on chronic congestive heart failure with volume overload.  Patient has a reduced ejection fraction per echo performed October 2019 of 40 to 45%.  He is seeing providers through the [place] at [place] as well as primary care at the [facility] in [place] ordered for goals of care in the setting of progressive weakness secondary to CVA, followed by new diagnosis of non-small cell lung cancer.   Clinical Assessment and Goals of Care: Patient seen, chart reviewed.  His wife [name], is at the bedside.  Patient has had a marketed functional decline over the past several months even before he was diagnosed with non-small cell lung cancer.  In addition to the above-mentioned medical history, patient is a vasculopath with multiple aneurysms per PET scan performed November 17, 2017.  Patient has been seen by oncologist at [facility], Dr. Doy Mince.  Also was seen by pulmonology services here in [place], [place].  Mrs. Coopersmith states they have attempted chemotherapy on 2 occasions at the [facility] in [place] but he was unable to go forward with either treatment because of low oxygen sats.  I asked Mr. Galindo what his understanding of  the goals of chemotherapy and radiation treatment were for his cancer and he states he was told they could "cure him".  He also states "I am not dead yet".   In addition to introducing palliative medicine services as an additional resource and source of support, I did address CODE STATUS.  I defined full code as well as DNR.  Patient is a full code by choice per Mrs. Goerke.  Mrs. Panning also shares that the [facility] has outsourced radiation treatment to [facility] long hospital radiation oncology department.  She states she received notification of that per letter today.  The contact person to verify this at the [place] is [place], [place] [phone], extension [phone].  Patient can participate to a limited degree in goals of care discussion but I am concerned that he does not have full understanding of the complexity of this new diagnosis in the setting of his other comorbidities.  His healthcare proxy, would be his wife, Pearly Apachito at [phone]    SUMMARY OF RECOMMENDATIONS   Full code by choice Patient wishes to proceed with chemo and radiation treatment Patient spouse is asking if radiation oncology consult could be obtained  while he is in the hospital.  She feels that they have gotten behind on  treatment because of recurrent pneumonia/hospitalizations Reviewed MOST form as well as gave patient and patient's wife, Hard Choices for [employer] booklet as a resource to begin to think about advanced care planning Patient and spouse are amenable to filling out advance care planning in the hospital.  Will place spiritual care consult as well Will follow-up with family tomorrow at 66 AM to address further questions and concerns Patient observed to cough after eating and drinking thin liquids.  I also see that all a.m. meds including antibiotics Cardizem anticoagulation were not given.  Patient might be more successful if medications could be changed to IV route as we evaluate his swallow? Code Status/Advance  Care Planning:  Full code   Palliative Prophylaxis:   Aspiration, Bowel Regimen, Delirium Protocol, Eye Care, Frequent Pain Assessment, Oral Care and Turn Reposition  Additional Recommendations (Limitations, Scope, Preferences):  Full Scope Treatment  Psycho-social/Spiritual:   Desire for further Chaplaincy support:yes  Additional Recommendations: Referral to Community Resources   Prognosis:   Unable to determine  Discharge Planning: To Be Determined      Primary Diagnoses: Present on Admission: . Permanent atrial fibrillation . Essential hypertension . Non-small cell carcinoma of lung ([place]) . COPD (chronic obstructive pulmonary disease) with emphysema ([place])   I have reviewed the medical record, interviewed the patient and family, and examined the patient. The following aspects are pertinent.  Past Medical History:  Diagnosis Date  . Aortic valve regurgitation 01/26/14  . Atrial fibrillation ([place])   . 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 CHF (congestive heart failure) ([place]) 01/26/2014  . COPD (chronic obstructive pulmonary disease) ([place])   . CVA (cerebral vascular accident) ([place]) 10/2017   "left side still weak" (01/20/2018)  . Emphysema (subcutaneous) (surgical) resulting from a procedure   . Heart murmur   . Hypertension   . 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 Lung cancer ([place]) dx'd 12/2017  . Lung mass   . Mitral valve regurgitation 01/26/2014  . Myocardial infarction ([place])    "I've had 2; last 1 was ~ 1 wk ago" (01/20/2018)  . On home oxygen therapy    "2L; 24/7" (01/20/2018)  . Pneumonia    "several times" (01/20/2018)  . Pseudophakia 08/17/2013  . 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 Thoracic aortic aneurysm ([place])   . Vitamin D deficiency    Social History   Socioeconomic History  . Marital status: Married    Spouse name: Not on file  . Number of children: 1  . Years of education: Not on file  . Highest education level: Not on file  Occupational History  . Not on file   Social Needs  . Financial resource strain: Not on file  . Food insecurity:    Worry: Not on file    Inability: Not on file  . Transportation needs:    Medical: Not on file    Non-medical: Not on file  Tobacco Use  . Smoking status: Former Smoker    Years: 45.00    Types: Cigarettes    Last attempt to quit: 2007    Years since quitting: 13.0  . Smokeless tobacco: Never Used  Substance and Sexual Activity  . Alcohol use: Yes    Alcohol/week: 8.0 standard drinks    Types: 8 Cans of beer per week  . Drug use: No  . Sexual activity: Not on file  Lifestyle  . Physical activity:  Days per week: Not on file    Minutes per session: Not on file  . Stress: Not on file  Relationships  . Social connections:    Talks on phone: Not on file    Gets together: Not on file    Attends religious service: Not on file    Active member of club or organization: Not on file    Attends meetings of clubs or organizations: Not on file    Relationship status: Not on file  Other Topics Concern  . Not on file  Social History Narrative   Pt is right handed   Lives in single story home with his wife   Has 1 child   Some college education   Retired Dealer   Family History  Problem Relation Age of Onset  . Stroke Father    Scheduled Meds: . amLODipine  10 mg Oral Daily  . apixaban  5 mg Oral BID  . atorvastatin  40 mg Oral Daily  . cefdinir  300 mg Oral BID  . diltiazem  60 mg Oral BID  . [START ON 01/22/2018] feeding supplement (ENSURE ENLIVE)  237 mL Oral BID BM  . furosemide  40 mg Intravenous Daily  . gabapentin  400 mg Oral BID  . lisinopril  2.5 mg Oral [id number]  . loratadine  10 mg Oral Daily  . mouth rinse  15 mL Mouth Rinse BID  . [START ON 01/22/2018] multivitamin with minerals  1 tablet Oral Daily   Continuous Infusions: PRN Meds:.acetaminophen **OR** acetaminophen, albuterol, senna-docusate Medications Prior to Admission:  Prior to Admission medications   Medication Sig  Start Date End Date Taking? Authorizing Provider  albuterol (PROVENTIL HFA;VENTOLIN HFA) 108 (90 BASE) MCG/ACT inhaler Inhale 2 puffs into the lungs every 6 (six) hours as needed for wheezing or shortness of breath.   Yes [provider]  albuterol (PROVENTIL) (2.5 MG/3ML) 0.083% nebulizer solution Take 2.5 mg by nebulization every 6 (six) hours as needed for wheezing or shortness of breath.   Yes [provider]  amLODipine (NORVASC) 10 MG tablet Take 10 mg by mouth daily.   Yes [provider]  apixaban (ELIQUIS) 5 MG TABS tablet Take 1 tablet (5 mg total) by mouth 2 (two) times daily. 10/18/17  Yes Gherghe, Vella Redhead, MD  atorvastatin (LIPITOR) 40 MG tablet Take 40 mg by mouth daily.   Yes [provider]  cefdinir (OMNICEF) 300 MG capsule Take 300 mg by mouth 2 (two) times daily. 3 doses remaining 01/17/18 01/21/18 Yes [provider]  diltiazem (CARDIZEM) 60 MG tablet Take 60 mg by mouth 2 (two) times daily. 01/17/18  Yes [provider]  furosemide (LASIX) 40 MG tablet Take 40 mg by mouth daily.    Yes [provider]  gabapentin (NEURONTIN) 400 MG capsule Take 400 mg by mouth 2 (two) times daily.    Yes [provider]  lisinopril (PRINIVIL,ZESTRIL) 2.5 MG tablet Take 2.5 mg by mouth every morning. 01/18/18  Yes [provider]  loratadine (CLARITIN) 10 MG tablet Take 10 mg by mouth daily.   Yes [provider]  metoprolol tartrate (LOPRESSOR) 25 MG tablet Take 37.5 mg by mouth 2 (two) times daily.    Yes [provider]  Multiple Vitamins-Minerals (MULTIVITAMIN WITH MINERALS) tablet Take 1 tablet by mouth daily.   Yes [provider]  ondansetron (ZOFRAN-ODT) 4 MG disintegrating tablet Take 4 mg by mouth every 8 (eight) hours as needed for nausea or  vomiting.   Yes [provider]  tiotropium (SPIRIVA) 18 MCG inhalation capsule Place 1 capsule into inhaler and inhale 2 (two) times daily.     Yes [provider]   No Known Allergies Review of Systems  Unable to perform ROS: Other    Physical Exam Vitals signs and nursing note reviewed.  Constitutional:      Comments: Acutely ill-appearing elderly man  HENT:     Head: Normocephalic and atraumatic.  Neck:     Musculoskeletal: Normal range of motion.  Cardiovascular:     Comments: Tachycardic Pulmonary:     Comments: Mild increased work of breathing at rest Moist cough Genitourinary:    Comments: Foley Skin:    General: Skin is warm and dry.  Neurological:     Comments: Oriented to person place and that generally he is ill.  He does understand that he has cancer but I do not know if he appreciates the complexity of this diagnosis in the setting of his other comorbidities  Psychiatric:     Comments: Affect constricted     Vital Signs: BP (!) 111/99 (BP Location: Right Arm)   Pulse 86   Temp (!) 97.2 F (36.2 C) (Oral)   Resp (!) 22   Ht 6\' 1"  (1.854 m)   Wt 75 kg   SpO2 95%   BMI 21.81 kg/m  Pain Scale: 0-10   Pain Score: 0-No pain   SpO2: SpO2: 95 % O2 Device:SpO2: 95 % O2 Flow Rate: .O2 Flow Rate (L/min): 5 L/min  IO: Intake/output summary:   Intake/Output Summary (Last 24 hours) at 01/21/2018 1747 Last data filed at 01/21/2018 0841 Gross per 24 hour  Intake 360 ml  Output 1600 ml  Net -1240 ml    LBM: Last BM Date: 01/21/18 Baseline Weight: Weight: 76.2 kg Most recent weight: Weight: 75 kg     Palliative Assessment/Data:   Flowsheet Rows     Most Recent Value  Intake Tab  Referral Department  Hospitalist  Unit at Time of Referral  Intermediate Care Unit  Palliative Care Primary Diagnosis  Pulmonary  Date Notified  01/20/18  Palliative Care Type  New Palliative care  Reason for referral  Clarify Goals of Care, Psychosocial or Spiritual support  Date of Admission  01/20/18  Date first seen by Palliative Care  01/21/18  # of days Palliative referral response time  1 Day(s)   # of days IP prior to Palliative referral  0  Clinical Assessment  Pain Max last 24 hours  Not able to report  Pain Min Last 24 hours  Not able to report  Dyspnea Max Last 24 Hours  Not able to report  Dyspnea Min Last 24 hours  Not able to report  Nausea Max Last 24 Hours  Not able to report  Nausea Min Last 24 Hours  Not able to report  Anxiety Max Last 24 Hours  Not able to report  Anxiety Min Last 24 Hours  Not able to report  Other Max Last 24 Hours  Not able to report  Psychosocial & Spiritual Assessment  Palliative Care Outcomes  Patient/Family meeting held?  Yes  Who was at the meeting?  pt and wife  Palliative Care follow-up planned  Yes, Facility      Time In: 1600 Time Out: 1715 Time Total: 75 min Greater than 50%  of this time was spent counseling and coordinating care related to the above assessment and plan.  Signed by: Judson Roch  Hughes Better, NP   Please contact Palliative Medicine Team phone at [phone] for questions and concerns.  For individual provider: See Shea Evans

## 2018-01-21 NOTE — Plan of Care (Addendum)
Pt refused to take AM oral medication. RN had crushed meds and also offered whole pills. Pt refused. MD aware, spouse at bedside. HR elevated from 125-140s AF. Will continue to monitor.    Problem: Education: Goal: Knowledge of General Education information will improve Description Including pain rating scale, medication(s)/side effects and non-pharmacologic comfort measures Outcome: Progressing   Problem: Coping: Goal: Level of anxiety will decrease Outcome: Completed/Met   Problem: Elimination: Goal: Will not experience complications related to urinary retention Outcome: Progressing   Problem: Pain Managment: Goal: General experience of comfort will improve Outcome: Completed/Met

## 2018-01-22 LAB — CBC
HCT: 36.9 % — ABNORMAL LOW (ref 39.0–52.0)
Hemoglobin: 11.5 g/dL — ABNORMAL LOW (ref 13.0–17.0)
MCH: 29.4 pg (ref 26.0–34.0)
MCHC: 31.2 g/dL (ref 30.0–36.0)
MCV: 94.4 fL (ref 80.0–100.0)
NRBC: 0 % (ref 0.0–0.2)
Platelets: 137 10*3/uL — ABNORMAL LOW (ref 150–400)
RBC: 3.91 MIL/uL — ABNORMAL LOW (ref 4.22–5.81)
RDW: 13.2 % (ref 11.5–15.5)
WBC: 11.7 10*3/uL — AB (ref 4.0–10.5)

## 2018-01-22 LAB — BASIC METABOLIC PANEL
Anion gap: 9 (ref 5–15)
BUN: 22 mg/dL (ref 8–23)
CO2: 30 mmol/L (ref 22–32)
Calcium: 8.3 mg/dL — ABNORMAL LOW (ref 8.9–10.3)
Chloride: 99 mmol/L (ref 98–111)
Creatinine, Ser: 1.36 mg/dL — ABNORMAL HIGH (ref 0.61–1.24)
GFR calc Af Amer: 58 mL/min — ABNORMAL LOW (ref >60)
GFR calc non Af Amer: 50 mL/min — ABNORMAL LOW (ref >60)
Glucose, Bld: 100 mg/dL — ABNORMAL HIGH (ref 70–99)
Potassium: 3.7 mmol/L (ref 3.5–5.1)
SODIUM: 138 mmol/L (ref 135–145)

## 2018-01-22 MED ORDER — FUROSEMIDE 20 MG PO TABS
20.0000 mg | ORAL_TABLET | Freq: Every day | ORAL | Status: DC
  Administered 2018-01-22: 20 mg via ORAL
  Filled 2018-01-22: qty 1

## 2018-01-22 MED ORDER — FUROSEMIDE 10 MG/ML IJ SOLN
40.0000 mg | Freq: Once | INTRAMUSCULAR | Status: AC
  Administered 2018-01-22: 40 mg via INTRAVENOUS
  Filled 2018-01-22: qty 4

## 2018-01-22 MED ORDER — METOPROLOL TARTRATE 25 MG PO TABS
25.0000 mg | ORAL_TABLET | Freq: Two times a day (BID) | ORAL | Status: DC
  Administered 2018-01-22 (×2): 25 mg via ORAL
  Filled 2018-01-22 (×2): qty 1

## 2018-01-22 NOTE — Progress Notes (Signed)
Patient seen, chart reviewed.  Patient's wife at the bedside.  Patient currently is sleeping.  Cody Lane is dealing with complicated medical problems in addition to a complicated medical system.  He is currently being seen through the [place] as well as local hospitalizations.  Their goal, is to try chemotherapy and radiation therapy for his non-small cell lung cancer.  [facility]2018[facility] his radiation oncology to [facility] health system, at [place].  Cody Lane has gotten so weak it will not be feasible for him to pursue chemotherapy in [facility].  Cody Lane needs an advocate to help get his chemotherapy outsourced to [facility] health at [place] long.  This is per his wife's request.  He is going to be too weak to return to the home after his current hospitalization and will be requiring skilled nursing support in the interim.  Patient's oncologist at the [facility] in [facility], is Dr. Doy Mince who can be reached at [phone]; [phone] extension [phone].  Cody Lane recognizes the severity of her husband's illness and that he could be looking at trending towards end-of-life whether he has chemo, radiation treatment or not.  But it is his goal to try.  We will place consult to case management/social work to see if they can help advocate to liaison between the [place] and getting Cody Lane seen at [facility] health oncology center.  Palliative medicine to stay involved and to continue to support patient and family as well as hopefully being able to help assist with this transition.  Disposition would under best circumstances be to [facility] place with local radiation oncology and chemotherapy.  Patient is going to benefit and need ongoing goals of care.  If he were seen at [facility] health, palliative medicine has a designated provider in the oncology center, Cody Lessen, Cody Lane .  Or community services can be provided from care connection at [phone] or  hospice and palliative care of [place]'s palliative medicine department at [place], ANP Time Spent: 35 min 50% of time spent in counseling and coordination of services

## 2018-01-22 NOTE — Progress Notes (Addendum)
   Subjective: [name] did not work with PT yesterday due to fatigue. This morning, [name] is sleeping in bed in no acute distress. He awakens easily and states that he is doing well. He does not have any complaints this morning. Per his nurse, he refused yesterdays mornings medications but did take his night-time medication with coaxing.   Objective:  Vital signs in last 24 hours: Vitals:   01/21/18 2145 01/22/18 0029 01/22/18 0311 01/22/18 0530  BP:  103/82 103/87   Pulse:  (!) 113 (!) 101   Resp: (!) 21 (!) 23 (!) 22   Temp:  98 F (36.7 C) 98 F (36.7 C)   TempSrc:  Oral Oral   SpO2:  93% 93%   Weight:    73.2 kg  Height:       General: Sleeping in bed in no acute distress. CV: Irregular rhythm, tachycardia Resp: Diminished lung sounds at the lower lung bases bilaterally. No respiratory distress. MSK: No edema noted bilaterally. Non-tender to palpation.   Assessment/Plan:  Principal Problem:   Acute on chronic systolic heart failure (HCC) Active Problems:   Essential hypertension   Permanent atrial fibrillation   Non-small cell carcinoma of lung (HCC)   COPD (chronic obstructive pulmonary disease) with emphysema (HCC)   Chronic respiratory failure with hypoxia, on home O2 therapy (HCC)   Physical deconditioning   CAP (community acquired pneumonia)   Malnutrition of moderate degree   Goals of care, counseling/discussion   Palliative care by specialist  78 year old male with recently diagnosed non-small cell lung cancer, COPD with emphysema, HFrEF, A. fib, history of aortic valve who is being treated for acute on chronic hypoxic respiratory failure 2/2 acute on chronic systolic CHF, CAP, and non-small cell lung cancer. He is being treated with Lasix and has completed a short course of Cefdinir. Palliative consulted and recommends PO meds to be change to IV.[name] would like to proceed with full scope of care including chemo and radiation as an outpatient.    Acute on chronic hypoxic respiratory failure: Bedside US shows some continued pulmonary congestion. Will give one more dose of IV Lasix 40 and restart his home PO Lasix. - V Lasix 40 mg x1 - Restarted home Lasix 20 mg QD. - Discontinued Cefdinir - Follow-up PT/OT recs  Paroxysmal atrial fibrillation with RVR: Heart rate elevated 100-150 over the last 24 hours. He did miss yesterday mornings dose of diltiazem. Will continue to monitor today. - Continue diltiazem - Continue Eliquis  AKI: Improving with diuresis (creatinine 1.36 today). Baseline does seem to be 1.1-1.2.  - Continue oral Lasix per above  Hypertension: Continued home amlodipine, lisinopril, diltiazem, and furosemide. Blood pressure remains WNL. He is on 2 calcium channel blockers. Will consider discontinuing one of these today. - Continue amlodipine 10 mg QD - Continue lisinopril 2.4 mg QD - Continue diltiazem 60 mg BID - Lasix per above.  Dispo: Anticipated discharge in approximately 1-2 days.  Carroll Sage, MD 01/22/2018, 6:44 AM Pager: [phone]

## 2018-01-22 NOTE — Evaluation (Signed)
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 Fib, history of aortic valve replacement.  As noted he was recently discharged 3 days ago after hospitalization for hypoxia and pneumonia as well as acute metabolic encephalopathy due to those conditions.  At that hospitalization he ended up being discharged on oral antibiotics and with home oxygen therapy.  At home he removed his oxygen to go to the bathroom in the bathroom he developed left-sided chest pain.   Clinical Impression  Orders received for PT evaluation. Patient demonstrates deficits in functional mobility as indicated below. Will benefit from continued skilled PT to address deficits and maximize function. Will see as indicated and progress as tolerated.  Wife present during session, explains that she was caring for him during his few days at home between hospital readmission and that she was assisting with mobility and functional status with min assist. Patient now currently requiring moderate assist for basic mobility and 2 persons at times for safety. Feel patient would benefit from [facility] to improve functional status and ease burden of care.     Follow Up Recommendations SNF;Supervision/Assistance - 24 hour    Equipment Recommendations  (tbd)    Recommendations for Other Services       Precautions / Restrictions Precautions Precautions: Fall Precaution Comments: oxygen dependent      Mobility  Bed Mobility Overal bed mobility: Needs Assistance Bed Mobility: Supine to Sit     Supine to sit: Min assist     General bed mobility comments: min assist with max multi modal cues and positioning   Transfers Overall  transfer level: Needs assistance Equipment used: 2 person hand held assist Transfers: Sit to/from [facility] Sit to Stand: Mod assist Stand pivot transfers: Min assist       General transfer comment: Increased physical assist to power up to standing with facilitation to initiation anterior translation movement. Min assist of 2 to pivot to the chair with noted LE weakness and elevated HR 150s  Ambulation/Gait             General Gait Details: deferred due to HR elevation mid 150s  Stairs            Wheelchair Mobility    Modified Rankin (Stroke Patients Only)       Balance Overall balance assessment: Needs assistance;History of Falls   Sitting balance-Leahy Scale: Fair     Standing balance support: Bilateral upper extremity supported Standing balance-Leahy Scale: Poor Standing balance comment: reliance on external supported                             Pertinent Vitals/Pain      Home Living Family/patient expects to be discharged to:: Private residence Living Arrangements: Spouse/significant other Available Help at Discharge: Family;Available 24 hours/day Type of Home: House Home Access: Level entry(threshold step)     Home Layout: One level Home Equipment: Walker - 2 wheels;Cane - single point;Shower seat      Prior Function Level of Independence: Needs assistance   Gait / Transfers Assistance Needed: wife was providing min assist during mobility     Comments: usual use of cane     Hand Dominance  Dominant Hand: Right    Extremity/Trunk Assessment   Upper Extremity Assessment Upper Extremity Assessment: Generalized weakness    Lower Extremity Assessment Lower Extremity Assessment: Generalized weakness       Communication   Communication: HOH  Cognition Arousal/Alertness: Awake/alert Behavior During Therapy: WFL for tasks assessed/performed Overall Cognitive Status: History of cognitive impairments - at  baseline                                        General Comments      Exercises     Assessment/Plan    PT Assessment Patient needs continued PT services  PT Problem List Decreased strength;Decreased activity tolerance;Decreased balance;Decreased mobility;Decreased coordination;Decreased cognition;Decreased safety awareness;Cardiopulmonary status limiting activity       PT Treatment Interventions DME instruction;Gait training;Therapeutic activities;Functional mobility training;Therapeutic exercise;Balance training;Neuromuscular re-education;Cognitive remediation;Patient/family education;Wheelchair mobility training    PT Goals (Current goals can be found in the Care Plan section)  Acute Rehab PT Goals Patient Stated Goal: to improve strength and mobility PT Goal Formulation: With family Time For Goal Achievement: 02/05/18 Potential to Achieve Goals: Fair    Frequency Min 2X/week   Barriers to discharge        Co-evaluation               AM-PAC PT "6 Clicks" Mobility  Outcome Measure Help needed turning from your back to your side while in a flat bed without using bedrails?: A Little Help needed moving from lying on your back to sitting on the side of a flat bed without using bedrails?: A Little Help needed moving to and from a bed to a chair (including a wheelchair)?: A Lot Help needed standing up from a chair using your arms (e.g., wheelchair or bedside chair)?: A Lot Help needed to walk in hospital room?: A Lot Help needed climbing 3-5 steps with a railing? : Total 6 Click Score: 13    End of Session Equipment Utilized During Treatment: Oxygen Activity Tolerance: Treatment limited secondary to medical complications (Comment)(HR elevations with minimal activity) Patient left: in chair;with call bell/phone within reach;with chair alarm set;with family/visitor present Nurse Communication: Mobility status PT Visit Diagnosis: Difficulty in walking,  not elsewhere classified (R26.2);Muscle weakness (generalized) (M62.81)    Time: 9292-4462 PT Time Calculation (min) (ACUTE ONLY): 21 min   Charges:   PT Evaluation $PT Eval Moderate Complexity: 1 Mod          Alben Deeds, PT DPT  Board Certified Neurologic Specialist Acute Rehabilitation Services Pager [phone] Office [place] 01/22/2018, 10:52 AM

## 2018-01-23 DIAGNOSIS — I480 Paroxysmal atrial fibrillation: Secondary | ICD-10-CM

## 2018-01-23 LAB — CBC WITH DIFFERENTIAL/PLATELET
Abs Immature Granulocytes: 0.05 10*3/uL (ref 0.00–0.07)
Basophils Absolute: 0.1 10*3/uL (ref 0.0–0.1)
Basophils Relative: 1 %
Eosinophils Absolute: 0.2 10*3/uL (ref 0.0–0.5)
Eosinophils Relative: 2 %
HCT: 38.6 % — ABNORMAL LOW (ref 39.0–52.0)
Hemoglobin: 11.6 g/dL — ABNORMAL LOW (ref 13.0–17.0)
IMMATURE GRANULOCYTES: 0 %
Lymphocytes Relative: 13 %
Lymphs Abs: 1.5 10*3/uL (ref 0.7–4.0)
MCH: 28.7 pg (ref 26.0–34.0)
MCHC: 30.1 g/dL (ref 30.0–36.0)
MCV: 95.5 fL (ref 80.0–100.0)
Monocytes Absolute: 1 10*3/uL (ref 0.1–1.0)
Monocytes Relative: 9 %
Neutro Abs: 8.9 10*3/uL — ABNORMAL HIGH (ref 1.7–7.7)
Neutrophils Relative %: 75 %
Platelets: 153 10*3/uL (ref 150–400)
RBC: 4.04 MIL/uL — ABNORMAL LOW (ref 4.22–5.81)
RDW: 13.2 % (ref 11.5–15.5)
WBC: 11.8 10*3/uL — ABNORMAL HIGH (ref 4.0–10.5)
nRBC: 0 % (ref 0.0–0.2)

## 2018-01-23 LAB — BASIC METABOLIC PANEL
ANION GAP: 8 (ref 5–15)
BUN: 24 mg/dL — ABNORMAL HIGH (ref 8–23)
CHLORIDE: 98 mmol/L (ref 98–111)
CO2: 34 mmol/L — ABNORMAL HIGH (ref 22–32)
Calcium: 8.5 mg/dL — ABNORMAL LOW (ref 8.9–10.3)
Creatinine, Ser: 1.48 mg/dL — ABNORMAL HIGH (ref 0.61–1.24)
GFR calc Af Amer: 52 mL/min — ABNORMAL LOW (ref >60)
GFR calc non Af Amer: 45 mL/min — ABNORMAL LOW (ref >60)
Glucose, Bld: 113 mg/dL — ABNORMAL HIGH (ref 70–99)
POTASSIUM: 3.9 mmol/L (ref 3.5–5.1)
Sodium: 140 mmol/L (ref 135–145)

## 2018-01-23 MED ORDER — FUROSEMIDE 40 MG PO TABS
40.0000 mg | ORAL_TABLET | Freq: Every day | ORAL | Status: DC
  Administered 2018-01-23: 40 mg via ORAL
  Filled 2018-01-23: qty 1

## 2018-01-23 MED ORDER — FUROSEMIDE 10 MG/ML IJ SOLN
40.0000 mg | Freq: Once | INTRAMUSCULAR | Status: AC
  Administered 2018-01-23: 40 mg via INTRAVENOUS
  Filled 2018-01-23: qty 4

## 2018-01-23 MED ORDER — METOPROLOL TARTRATE 50 MG PO TABS
50.0000 mg | ORAL_TABLET | Freq: Two times a day (BID) | ORAL | Status: DC
  Administered 2018-01-23 – 2018-01-24 (×3): 50 mg via ORAL
  Filled 2018-01-23 (×3): qty 1

## 2018-01-23 MED ORDER — UMECLIDINIUM BROMIDE 62.5 MCG/INH IN AEPB
1.0000 | INHALATION_SPRAY | Freq: Every day | RESPIRATORY_TRACT | Status: DC
  Administered 2018-01-24 – 2018-02-08 (×6): 1 via RESPIRATORY_TRACT
  Filled 2018-01-23 (×2): qty 7

## 2018-01-23 MED ORDER — TIOTROPIUM BROMIDE MONOHYDRATE 18 MCG IN CAPS: 18.0000 ug | ORAL_CAPSULE | Freq: Every day | RESPIRATORY_TRACT | Status: DC

## 2018-01-23 NOTE — Progress Notes (Signed)
   Subjective: Cody Lane is sleeping in bed with no acute distress. His wife believes that he is doing well and there has been no big changes. He is taking his medications appropriately.   Objective:  Vital signs in last 24 hours: Vitals:   01/23/18 0800 01/23/18 0828 01/23/18 0953 01/23/18 0954  BP: 99/80 99/80 102/86   Pulse:  62 (!) 58 (!) 101  Resp:  (!) 27 (!) 22   Temp:  98.5 F (36.9 C)    TempSrc:  Oral    SpO2:  92% 94%   Weight:      Height:       Physical Exam Vitals signs and nursing note reviewed.  Constitutional:      Comments: Sleeping in bed in no acute distress.  Cardiovascular:     Rate and Rhythm: Regular rhythm. Tachycardia present.  Pulmonary:     Comments: Diminished breath sounds in the lower lung bases. No respiratory distress. Oxygen saturations >96% on 3-4 L supplemental oxygen.    Assessment/Plan:  Principal Problem:   Acute on chronic systolic heart failure (HCC) Active Problems:   Essential hypertension   Permanent atrial fibrillation   Non-small cell carcinoma of lung (HCC)   COPD (chronic obstructive pulmonary disease) with emphysema (HCC)   Chronic respiratory failure with hypoxia, on home O2 therapy (HCC)   Physical deconditioning   CAP (community acquired pneumonia)   Malnutrition of moderate degree   Goals of care, counseling/discussion   Palliative care by specialist  Cody Lane is admitted for acute hypoxic respiratory failure secondary to acute on chronic systolic CHF, CAP s/p high-dose antibiotic treatment, non-small cell lung cancer, and chronic COPD.  He is still requiring a large amount of supplemental oxygen and bedside ultrasound performed today still showed pulmonary edema which has improved from yesterday's exam.  Will order another dose of IV Lasix today.  Palliative care is working on having his radiation and chemotherapy treatments at [facility] instead of in [place], [place].  I have consulted case management to help with  this as well.  I believe that this is reasonable given his current debilitated state.  Acute hypoxic respiratory failure: - IV Lasix 40 mg x 1 - Continue PT - Continue supplemental oxygen as needed  Paroxysmal atrial fibrillation with RVR: Heart rate has improved with addition of metoprolol.  Will discontinue diltiazem today and increase metoprolol dose. - Discontinued diltiazem - Increased dose of metoprolol to 50 mg twice daily - Continue Eliquis  AKI: Remains elevated.  We will continue diuresis today. - Continue IV Lasix per above  Hypertension: Discontinued amlodipine yesterday given low blood pressure and risk for hypotension.  His blood pressures remain well controlled on lisinopril, metoprolol, Lasix.  - Continue lisinopril 2.4 mg QD - Continue metoprolol per above - Continue lasix per above  Dispo: Anticipated discharge in approximately 2-3 days.  Carroll Sage, MD 01/23/2018, 10:49 AM Pager: [phone]

## 2018-01-23 NOTE — Clinical Social Work Note (Signed)
Please consult CM to assist in getting Cody Lane seen at [facility].   [place], [place]

## 2018-01-23 NOTE — NC FL2 (Addendum)
[place] LEVEL OF CARE SCREENING TOOL     IDENTIFICATION  Patient Name: Cody Lane Birthdate: Dec 09, 1940 Sex: male Admission Date (Current Location): 01/20/2018  [facility] and [place] Number:  Herbalist and Address:  [facility], [place] [address], [place], [place] [address]      Provider Number: [id number]  Attending Physician Name and Address:  Lucious Groves, DO  Relative Name and Phone Number:       Current Level of Care: Hospital Recommended Level of Care: [place] Prior Approval Number:    Date Approved/Denied:   PASRR Number: [id number]  Discharge Plan: SNF    Current Diagnoses: Patient Active Problem List   Diagnosis Date Noted  . Acute on chronic systolic heart failure ([place]) 01/21/2018  . Physical deconditioning 01/21/2018  . CAP (community acquired pneumonia) 01/21/2018  . Malnutrition of moderate degree 01/21/2018  . Goals of care, counseling/discussion   . Palliative care by specialist   . Chest pain 01/20/2018  . Non-small cell carcinoma of lung ([place]) 01/20/2018  . COPD (chronic obstructive pulmonary disease) with emphysema ([place]) 01/20/2018  . Chronic respiratory failure with hypoxia, on home O2 therapy ([place]) 01/20/2018  . Vertigo 10/15/2017  . Diplopia 10/15/2017  . Cough   . Aortic valve insufficiency S/P aortic valve replacement 03/26/2014  . Kidney mass 03/26/2014  . Essential hypertension 03/26/2014  . Permanent atrial fibrillation 03/26/2014  . Long term current use of anticoagulant therapy 03/26/2014  . Hyperlipidemia 03/26/2014  . CHF (congestive heart failure) ([place]) 03/26/2014  . Insomnia 03/26/2014    Orientation RESPIRATION BLADDER Height & Weight     Self, Time, Situation, Place  O2(Nasal Cannula 5L) External catheter, Incontinent(placed 01/20/2018) Weight: 161 lb 6 oz (73.2 kg) Height:  6\' 1"  (185.4 cm)  BEHAVIORAL SYMPTOMS/MOOD NEUROLOGICAL BOWEL NUTRITION STATUS       Incontinent Diet(heart healthy, thin liquids)  AMBULATORY STATUS COMMUNICATION OF NEEDS Skin   Extensive Assist Verbally Normal                       Personal Care Assistance Level of Assistance  Bathing, Feeding, Dressing Bathing Assistance: Maximum assistance Feeding assistance: Limited assistance Dressing Assistance: Maximum assistance     Functional Limitations Info  Sight, Hearing, Speech Sight Info: Adequate Hearing Info: Adequate Speech Info: Adequate    SPECIAL CARE FACTORS FREQUENCY  PT (By licensed PT), OT (By licensed OT)     PT Frequency: 2x OT Frequency: 2x            Contractures Contractures Info: Not present    Additional Factors Info  Code Status, Allergies Code Status Info: Full Code Allergies Info: No known allergies           Current Medications (01/23/2018):  This is the current hospital active medication list Current Facility-Administered Medications  Medication Dose Route Frequency Provider Last Rate Last Dose  . acetaminophen (TYLENOL) tablet 650 mg  650 mg Oral Q6H PRN Kalman Shan Ratliff, DO       Or  . acetaminophen (TYLENOL) suppository 650 mg  650 mg Rectal Q6H PRN Hoffman, Jessica Ratliff, DO      . albuterol (PROVENTIL) (2.5 MG/3ML) 0.083% nebulizer solution 2.5 mg  2.5 mg Nebulization Q6H PRN Valinda Party, DO      . apixaban (ELIQUIS) tablet 5 mg  5 mg Oral BID Kalman Shan Ratliff, DO   5 mg at 01/22/18 2139  . atorvastatin (  LIPITOR) tablet 40 mg  40 mg Oral Daily Kalman Shan Ratliff, DO   40 mg at 01/22/18 0936  . feeding supplement (ENSURE ENLIVE) (ENSURE ENLIVE) liquid 237 mL  237 mL Oral BID BM Joni Reining C, DO      . furosemide (LASIX) tablet 40 mg  40 mg Oral Daily Nita Sickle M, MD      . gabapentin (NEURONTIN) capsule 400 mg  400 mg Oral BID Kalman Shan Ratliff, DO   400 mg at 01/22/18 2140  . lisinopril (PRINIVIL,ZESTRIL) tablet 2.5 mg  2.5 mg Oral [address] Jessica Ratliff,  DO   2.5 mg at 01/22/18 0817  . loratadine (CLARITIN) tablet 10 mg  10 mg Oral Daily Kalman Shan Ratliff, DO   10 mg at 01/22/18 0936  . MEDLINE mouth rinse  15 mL Mouth Rinse BID Joni Reining C, DO   15 mL at 01/22/18 2140  . metoprolol tartrate (LOPRESSOR) tablet 50 mg  50 mg Oral BID Carroll Sage, MD      . multivitamin with minerals tablet 1 tablet  1 tablet Oral Daily Lucious Groves, DO   1 tablet at 01/22/18 0935  . senna-docusate (Senokot-S) tablet 1 tablet  1 tablet Oral QHS PRN Valinda Party, DO         Discharge Medications: Please see discharge summary for a list of discharge medications.  Relevant Imaging Results:  Relevant Lab Results:   Additional Information SSN: [id number]   Will be receiving chemotherapy  Xiamara Hulet A Nishawn Rotan, LCSW

## 2018-01-24 LAB — BASIC METABOLIC PANEL
Anion gap: 8 (ref 5–15)
BUN: 25 mg/dL — ABNORMAL HIGH (ref 8–23)
CALCIUM: 8.4 mg/dL — AB (ref 8.9–10.3)
CO2: 34 mmol/L — ABNORMAL HIGH (ref 22–32)
Chloride: 97 mmol/L — ABNORMAL LOW (ref 98–111)
Creatinine, Ser: 1.36 mg/dL — ABNORMAL HIGH (ref 0.61–1.24)
GFR calc Af Amer: 58 mL/min — ABNORMAL LOW (ref >60)
GFR calc non Af Amer: 50 mL/min — ABNORMAL LOW (ref >60)
Glucose, Bld: 112 mg/dL — ABNORMAL HIGH (ref 70–99)
Potassium: 4.4 mmol/L (ref 3.5–5.1)
Sodium: 139 mmol/L (ref 135–145)

## 2018-01-24 LAB — CBC
HCT: 38.7 % — ABNORMAL LOW (ref 39.0–52.0)
Hemoglobin: 11.7 g/dL — ABNORMAL LOW (ref 13.0–17.0)
MCH: 29.2 pg (ref 26.0–34.0)
MCHC: 30.2 g/dL (ref 30.0–36.0)
MCV: 96.5 fL (ref 80.0–100.0)
PLATELETS: 145 10*3/uL — AB (ref 150–400)
RBC: 4.01 MIL/uL — AB (ref 4.22–5.81)
RDW: 13 % (ref 11.5–15.5)
WBC: 12.7 10*3/uL — ABNORMAL HIGH (ref 4.0–10.5)
nRBC: 0 % (ref 0.0–0.2)

## 2018-01-24 MED ORDER — METOPROLOL TARTRATE 50 MG PO TABS
50.0000 mg | ORAL_TABLET | Freq: Three times a day (TID) | ORAL | Status: DC
  Administered 2018-01-24 – 2018-01-27 (×10): 50 mg via ORAL
  Filled 2018-01-24 (×10): qty 1

## 2018-01-24 MED ORDER — FUROSEMIDE 10 MG/ML IJ SOLN
40.0000 mg | Freq: Once | INTRAMUSCULAR | Status: AC
  Administered 2018-01-24: 40 mg via INTRAVENOUS
  Filled 2018-01-24: qty 4

## 2018-01-24 NOTE — Plan of Care (Signed)
  Problem: Clinical Measurements: Goal: Ability to maintain clinical measurements within normal limits will improve Outcome: Progressing Goal: Will remain free from infection Outcome: Progressing Goal: Respiratory complications will improve Outcome: Progressing Goal: Cardiovascular complication will be avoided Outcome: Progressing   Problem: Nutrition: Goal: Adequate nutrition will be maintained Outcome: Progressing   Problem: Elimination: Goal: Will not experience complications related to bowel motility Outcome: Progressing Goal: Will not experience complications related to urinary retention Outcome: Progressing   Problem: Safety: Goal: Ability to remain free from injury will improve Outcome: Progressing   Problem: Skin Integrity: Goal: Risk for impaired skin integrity will decrease Outcome: Progressing

## 2018-01-24 NOTE — Progress Notes (Signed)
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 Fib, history of aortic valve replacement.  As noted he was recently discharged 3 days ago after hospitalization for hypoxia and pneumonia as well as acute metabolic encephalopathy due to those conditions.  At that hospitalization he ended up being discharged on oral antibiotics and with home oxygen therapy.  At home he removed his oxygen to go to the bathroom in the bathroom he developed left-sided chest pain.     PT Comments    Patient seen for activity progression. Continues to demonstrate significant deficits in activity tolerance and mobility secondary to HR elevation. During session HR elevations to low 160s and O2 saturations to mid 80s. At this time, patient remains intermittently confused. States that he is "at home." At this time, current POC remains appropriate.   Follow Up Recommendations  SNF;Supervision/Assistance - 24 hour     Equipment Recommendations  (tbd)    Recommendations for Other Services       Precautions / Restrictions Precautions Precautions: Fall Precaution Comments: oxygen dependent Restrictions Weight Bearing Restrictions: No    Mobility  Bed Mobility Overal bed mobility: Needs Assistance Bed Mobility: Supine to Sit     Supine to sit: Min assist     General bed mobility comments: min assist with max multi modal cues and positioning   Transfers Overall transfer level: Needs assistance Equipment used: 2 person hand held assist Transfers: Sit to/from [facility] Sit to Stand: Mod assist(from bed, min assist from chair)         General transfer comment: +2 moderate assist  to initiate steps towards chairs and to turn around to sit. Cues for hand placement  Ambulation/Gait Ambulation/Gait assistance: Mod assist;+2 physical assistance Gait Distance (Feet): 5 Feet Assistive device: 2 person hand held assist       General Gait Details: steps forward and to turn around to chair   Stairs             Wheelchair Mobility    Modified Rankin (Stroke Patients Only)       Balance Overall balance assessment: Needs assistance;History of Falls   Sitting balance-Leahy Scale: Fair Sitting balance - Comments: able to sit EOb without feet touching, no physical assist required   Standing balance support: Bilateral upper extremity supported;During functional activity Standing balance-Leahy Scale: Poor Standing balance comment: reliance on external supported during upright functional tasks at counter                            Cognition Arousal/Alertness: Awake/alert Behavior During Therapy: WFL for tasks assessed/performed Overall Cognitive Status: History of cognitive impairments - at baseline                                        Exercises      General Comments General comments (skin integrity, edema, etc.): Hr elevations thorughout session ranging from 70s to upper lower 160s      Pertinent Vitals/Pain Pain Assessment: Faces Faces Pain Scale: Hurts a little bit Pain Intervention(s): Monitored during session    Home Living Family/patient expects to  be discharged to:: Private residence Living Arrangements: Spouse/significant other Available Help at Discharge: Family;Available 24 hours/day Type of Home: House Home Access: Level entry(threshold step)   Home Layout: One level Home Equipment: Walker - 2 wheels;Cane - single point;Shower seat Additional Comments: In process of having bathroom remodeled as handicap accessible    Prior Function            PT Goals (current goals can now be found in the care plan  section) Acute Rehab PT Goals Patient Stated Goal: to improve strength and mobility PT Goal Formulation: With family Time For Goal Achievement: 02/05/18 Potential to Achieve Goals: Fair Progress towards PT goals: Progressing toward goals    Frequency    Min 2X/week      PT Plan Current plan remains appropriate    Co-evaluation PT/OT/SLP Co-Evaluation/Treatment: Yes Reason for Co-Treatment: Complexity of the patient's impairments (multi-system involvement) PT goals addressed during session: Mobility/safety with mobility;Balance OT goals addressed during session: ADL's and self-care      AM-PAC PT "6 Clicks" Mobility   Outcome Measure  Help needed turning from your back to your side while in a flat bed without using bedrails?: A Little Help needed moving from lying on your back to sitting on the side of a flat bed without using bedrails?: A Little Help needed moving to and from a bed to a chair (including a wheelchair)?: A Lot Help needed standing up from a chair using your arms (e.g., wheelchair or bedside chair)?: A Lot Help needed to walk in hospital room?: A Lot Help needed climbing 3-5 steps with a railing? : Total 6 Click Score: 13    End of Session Equipment Utilized During Treatment: Oxygen Activity Tolerance: Treatment limited secondary to medical complications (Comment)(HR elevations with minimal activity) Patient left: in chair;with call bell/phone within reach;with chair alarm set;with family/visitor present Nurse Communication: Mobility status PT Visit Diagnosis: Difficulty in walking, not elsewhere classified (R26.2);Muscle weakness (generalized) (M62.81)     Time: 1010-1031 PT Time Calculation (min) (ACUTE ONLY): 21 min  Charges:  $Therapeutic Activity: 8-22 mins                     Alben Deeds, PT DPT  Board Certified Neurologic Specialist [place] Pager [phone] Office [place] 01/24/2018, 10:35  AM

## 2018-01-24 NOTE — Plan of Care (Signed)
Problem: Clinical Measurements: Goal: Will remain free from infection Outcome: Progressing Goal: Diagnostic test results will improve Outcome: Progressing   Problem: Activity: Goal: Risk for activity intolerance will decrease Outcome: Progressing   Problem: Education: Goal: Knowledge of General Education information will improve Description Including pain rating scale, medication(s)/side effects and non-pharmacologic comfort measures Outcome: Not Progressing   Problem: Nutrition: Goal: Adequate nutrition will be maintained Outcome: Not Progressing

## 2018-01-24 NOTE — Evaluation (Signed)
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 Fib, history of aortic valve replacement.  As noted he was recently discharged 3 days ago after hospitalization for hypoxia and pneumonia as well as acute metabolic encephalopathy due to those conditions.  At that hospitalization he ended up being discharged on oral antibiotics and with home oxygen therapy.  At home he removed his oxygen to go to the bathroom in the bathroom he developed left-sided chest pain.    Clinical Impression   This 78 yo male admitted with above presents to acute OT with increased HR with minimal activity and decreased endurance and safety awareness thus affecting his safety and independence with basic ADLs. He will benefit from acute OT with follow up OT at SNF. HR at beginning of session 113 and up to has high as 161 on one occasion.     Follow Up Recommendations  SNF;Supervision/Assistance - 24 hour    Equipment Recommendations  Other (comment)(TBD at next venue)       Precautions / Restrictions Precautions Precautions: Fall Precaution Comments: oxygen dependent Restrictions Weight Bearing Restrictions: No      Mobility Bed Mobility Overal bed mobility: Needs Assistance Bed Mobility: Supine to Sit     Supine to sit: Min assist     General bed mobility comments: min assist with max multi modal cues and positioning   Transfers Overall transfer level: Needs assistance Equipment used: 2 person hand held assist Transfers: Sit to/from [facility] Sit to Stand: Mod assist(from bed, min assist from chair)         General transfer comment: +2 moderate assist to initiate steps  towards chairs and to turn around to sit. Cues for hand placement    Balance Overall balance assessment: Needs assistance;History of Falls   Sitting balance-Leahy Scale: Fair Sitting balance - Comments: able to sit EOb without feet touching, no physical assist required   Standing balance support: Bilateral upper extremity supported;During functional activity Standing balance-Leahy Scale: Poor Standing balance comment: reliance on external supported during upright functional tasks at counter                           ADL either performed or assessed with clinical judgement   ADL Overall ADL's : Needs assistance/impaired Eating/Feeding: Set up;Sitting   Grooming: Minimal assistance;Standing;Oral care   Upper Body Bathing: Set up;Supervision/ safety;Sitting   Lower Body Bathing: Moderate assistance Lower Body Bathing Details (indicate cue type and reason): min-Mod A sit<>Stand dependent on height of surface Upper Body Dressing : Minimal assistance;Sitting   Lower Body Dressing: Maximal assistance Lower Body Dressing Details (indicate cue type and reason): min-Mod A sit<>Stand dependent on height of surface Toilet Transfer: Minimal assistance;+2 for safety/equipment;Stand-pivot Toilet Transfer Details (indicate cue type and reason): bed>recliner Toileting- Clothing Manipulation and Hygiene: Total assistance Toileting - Clothing Manipulation Details (indicate cue type and reason): min-Mod A sit<>Stand dependent on height of surface             Vision Patient Visual Report: No change from baseline              Pertinent Vitals/Pain Pain Assessment: No/denies pain Faces Pain Scale: Hurts a little bit Pain Intervention(s):  Monitored during session     Hand Dominance  right   Extremity/Trunk Assessment Upper Extremity Assessment Upper Extremity Assessment: Generalized weakness           Communication  no issues   Cognition Arousal/Alertness:  Awake/alert Behavior During Therapy: WFL for tasks assessed/performed Overall Cognitive Status: History of cognitive impairments - at baseline                                     General Comments  Hr elevations thorughout session ranging from 70s to upper lower 160s            Home Living Family/patient expects to be discharged to:: Private residence Living Arrangements: Spouse/significant other Available Help at Discharge: Family;Available 24 hours/day Type of Home: House Home Access: Level entry(threshold step)     Home Layout: One level     Bathroom Shower/Tub: Teacher, early years/pre: Standard Bathroom Accessibility: Yes   Home Equipment: Walker - 2 wheels;Cane - single point;Shower seat   Additional Comments: In process of having bathroom remodeled as handicap accessible               OT Problem List: Decreased strength;Decreased activity tolerance;Impaired balance (sitting and/or standing);Cardiopulmonary status limiting activity;Decreased knowledge of use of DME or AE;Decreased safety awareness;Decreased cognition      OT Treatment/Interventions: Self-care/ADL training;Balance training;DME and/or AE instruction;Patient/family education;Therapeutic activities    OT Goals(Current goals can be found in the care plan section) Acute Rehab OT Goals Patient Stated Goal: to get stronger OT Goal Formulation: With patient Time For Goal Achievement: 02/07/18 Potential to Achieve Goals: Good  OT Frequency: Min 2X/week           Co-evaluation PT/OT/SLP Co-Evaluation/Treatment: Yes Reason for Co-Treatment: Complexity of the patient's impairments (multi-system involvement) PT goals addressed during session: Mobility/safety with mobility OT goals addressed during session: ADL's and self-care;Strengthening/ROM      AM-PAC OT "6 Clicks" Daily Activity     Outcome Measure Help from another person eating meals?: A Little Help from another  person taking care of personal grooming?: A Little Help from another person toileting, which includes using toliet, bedpan, or urinal?: A Lot Help from another person bathing (including washing, rinsing, drying)?: A Lot Help from another person to put on and taking off regular upper body clothing?: A Lot Help from another person to put on and taking off regular lower body clothing?: Total 6 Click Score: 13   End of Session Equipment Utilized During Treatment: Gait belt;Oxygen(3.0) Nurse Communication: (increased HR)  Activity Tolerance: (increased HR (up to 161) with desat into upper 80's) Patient left: in chair;with call bell/phone within reach;with chair alarm set  OT Visit Diagnosis: Unsteadiness on feet (R26.81);Muscle weakness (generalized) (M62.81)                Time: 9169-4503 OT Time Calculation (min): 28 min Charges:  OT General Charges $OT Visit: 1 Visit OT Evaluation $OT Eval Moderate Complexity: 1 Mod  [place] Circle, OTR/L Acute [employer] Pager [phone] Office [phone]     Almon Register 01/24/2018, 12:06 PM

## 2018-01-24 NOTE — Care Management (Signed)
CM acknowledges consult for referral to [facility] oncology however CM can not made oncology referrals - these referrals have to be made by attending - CM text paged attending to inform.

## 2018-01-24 NOTE — Progress Notes (Signed)
RN noted overall poor urine output, bladder scan revealed 128mL in bladder. Will refrain from I&O.

## 2018-01-24 NOTE — Progress Notes (Signed)
Palliative Medicine RN Note: Weekend NP Romona Curls asked for help getting pt's chemo outsourced to [facility]. I called [facility] [phone], x [phone]), who sent them the letter saying the XRT has already been outsourced. He was away from his desk; I left a message asking for call back.  Marjie Skiff Sheccid Lahmann, RN, BSN, [facility] Palliative Medicine Team 01/24/2018 9:57 AM Office [phone]

## 2018-01-24 NOTE — Progress Notes (Signed)
   Subjective: Mr. Bramer is doing well this morning. His SOB has improved. He does not have any other acute complaints.  Objective:  Vital signs in last 24 hours: Vitals:   01/23/18 2125 01/23/18 2314 01/24/18 0600 01/24/18 0725  BP: 104/87 (!) 117/97  (!) 117/98  Pulse:  (!) 104    Resp: (!) 27 (!) 28 (!) 25 (!) 29  Temp:  97.6 F (36.4 C)  97.6 F (36.4 C)  TempSrc:  Oral  Oral  SpO2:  94%    Weight:   72.1 kg   Height:       General: Sitting up in bed in no acute distress. Resp: No respiratory distress. Diminished breath sounds throughout all lung fields. MSK: No LE tenderness to palpation or edema.  Assessment/Plan:  Principal Problem:   Acute on chronic systolic heart failure (HCC) Active Problems:   Essential hypertension   Permanent atrial fibrillation   Non-small cell carcinoma of lung (HCC)   COPD (chronic obstructive pulmonary disease) with emphysema (HCC)   Chronic respiratory failure with hypoxia, on home O2 therapy (HCC)   Physical deconditioning   CAP (community acquired pneumonia)   Malnutrition of moderate degree   Goals of care, counseling/discussion   Palliative care by specialist   Paroxysmal atrial fibrillation [facility])  Mr. Procter is admitted for acute hypoxic respiratory failure secondary to acute on chronic systolic CHF, non-small cell lung cancer, and chronic COPD. His oxygen requirements has improved and he states that he is feeling much less SOB. Bedside US performed which showed improvement in his pulmonary congestion. Will give him one more dose of IV Lasix today and plan on transitioning him to PO Lasix. I am working with Dr. Doy Mince, his oncologist at [place], [place], to transfer oncology care to [place], [place]. He will eventually be discharged to a SNF for further PT.   Acute hypoxic respiratory failure: - IV Lasix 40 mg x 1. Plan on transitioning to PO lasix tomorrow. - Continue PT - Continue supplemental oxygen as needed  Paroxysmal  atrial fibrillation with RVR: Heart rate increased with metoprolol alone. - Increased dose of metoprolol to 50 mg three times a day. - Continue Eliquis  AKI: Creatinine improved today. - Continue IV Lasix per above  Hypertension: His blood pressures remain well controlled on lisinopril, metoprolol, Lasix.  - Continue lisinopril 2.4 mg QD - Continue metoprolol per above - Continue lasix per above  Dispo: Anticipated discharge in approximately 1-2 days.  Carroll Sage, MD 01/24/2018, 9:35 AM Pager: [phone]

## 2018-01-25 ENCOUNTER — Inpatient Hospital Stay (HOSPITAL_COMMUNITY): Payer: No Typology Code available for payment source

## 2018-01-25 LAB — BLOOD GAS, VENOUS
Acid-Base Excess: 12.6 mmol/L — ABNORMAL HIGH (ref 0.0–2.0)
Bicarbonate: 37.9 mmol/L — ABNORMAL HIGH (ref 20.0–28.0)
O2 Content: 2 L/min
O2 Saturation: 87.5 %
PATIENT TEMPERATURE: 98.6
pCO2, Ven: 61.1 mmHg — ABNORMAL HIGH (ref 44.0–60.0)
pH, Ven: 7.409 (ref 7.250–7.430)
pO2, Ven: 56.1 mmHg — ABNORMAL HIGH (ref 32.0–45.0)

## 2018-01-25 LAB — LACTATE DEHYDROGENASE: LDH: 318 U/L — ABNORMAL HIGH (ref 98–192)

## 2018-01-25 LAB — PROTEIN, TOTAL: TOTAL PROTEIN: 6.8 g/dL (ref 6.5–8.1)

## 2018-01-25 LAB — CBC WITH DIFFERENTIAL/PLATELET
Abs Immature Granulocytes: 0.04 10*3/uL (ref 0.00–0.07)
Basophils Absolute: 0.1 10*3/uL (ref 0.0–0.1)
Basophils Relative: 1 %
Eosinophils Absolute: 0.1 10*3/uL (ref 0.0–0.5)
Eosinophils Relative: 1 %
HCT: 40.8 % (ref 39.0–52.0)
Hemoglobin: 12.2 g/dL — ABNORMAL LOW (ref 13.0–17.0)
Immature Granulocytes: 0 %
LYMPHS PCT: 11 %
Lymphs Abs: 1.3 10*3/uL (ref 0.7–4.0)
MCH: 28.9 pg (ref 26.0–34.0)
MCHC: 29.9 g/dL — ABNORMAL LOW (ref 30.0–36.0)
MCV: 96.7 fL (ref 80.0–100.0)
Monocytes Absolute: 0.9 10*3/uL (ref 0.1–1.0)
Monocytes Relative: 8 %
Neutro Abs: 9.1 10*3/uL — ABNORMAL HIGH (ref 1.7–7.7)
Neutrophils Relative %: 79 %
Platelets: 163 10*3/uL (ref 150–400)
RBC: 4.22 MIL/uL (ref 4.22–5.81)
RDW: 12.9 % (ref 11.5–15.5)
WBC: 11.5 10*3/uL — AB (ref 4.0–10.5)
nRBC: 0 % (ref 0.0–0.2)

## 2018-01-25 LAB — BASIC METABOLIC PANEL
ANION GAP: 8 (ref 5–15)
BUN: 27 mg/dL — AB (ref 8–23)
CO2: 35 mmol/L — ABNORMAL HIGH (ref 22–32)
Calcium: 8.8 mg/dL — ABNORMAL LOW (ref 8.9–10.3)
Chloride: 96 mmol/L — ABNORMAL LOW (ref 98–111)
Creatinine, Ser: 1.44 mg/dL — ABNORMAL HIGH (ref 0.61–1.24)
GFR calc Af Amer: 54 mL/min — ABNORMAL LOW (ref >60)
GFR calc non Af Amer: 47 mL/min — ABNORMAL LOW (ref >60)
Glucose, Bld: 99 mg/dL (ref 70–99)
Potassium: 4.2 mmol/L (ref 3.5–5.1)
Sodium: 139 mmol/L (ref 135–145)

## 2018-01-25 MED ORDER — FUROSEMIDE 40 MG PO TABS
40.0000 mg | ORAL_TABLET | Freq: Every day | ORAL | Status: DC
  Administered 2018-01-25: 40 mg via ORAL
  Filled 2018-01-25: qty 1

## 2018-01-25 NOTE — Progress Notes (Signed)
Pt BP 88/80 pt lethargic but responds appropriately to verbal ques. Primary team made aware of BP and pt being lethargic. No new orders for now. 1600 dose of metoprolol held. Will monitor.

## 2018-01-25 NOTE — Progress Notes (Signed)
[facility]  Patient Status: Active (receiving services up to time of hospitalization)  [facility] is providing the following services: RN and PT  If patient discharges after hours, please call [phone].   Cody Lane 01/25/2018, 12:40 PM

## 2018-01-25 NOTE — Care Management Important Message (Signed)
Important Message  Patient Details  Name: ALEXANDRIA CURRENT MRN: [id number] Date of Birth: [birth year]   Medicare Important Message Given:  Yes    Govani Radloff P Dannika Hilgeman 01/25/2018, 4:35 PM

## 2018-01-25 NOTE — Progress Notes (Signed)
   Subjective: Cody Lane report improvement in his shortness of breath. He is however on 4 L [place]. He does not hae any other complaints. We spoke with his wife about recommended thoracentesis of left pleural effusion for diagnostic/therapeutic purposes. Risks and benefits discussed. All questions answered. She is ammenable to having this done to help improve his breathing and supplemental oxygen requirements.   Objective:  Vital signs in last 24 hours: Vitals:   01/24/18 1530 01/24/18 2005 01/25/18 0510 01/25/18 0723  BP: 102/86 (!) 111/92 94/81 107/81  Pulse: 60 (!) 101  (!) 26  Resp: (!) 25 (!) 30 (!) 33 (!) 26  Temp: 97.9 F (36.6 C) 98.1 F (36.7 C) 97.9 F (36.6 C) 97.7 F (36.5 C)  TempSrc: Oral Oral Oral Oral  SpO2: (!) 88% 99% 96% 95%  Weight:   72.2 kg   Height:       General: Sitting up in bed in no acute distress. Resp: Diminished lungs sounds at the lung bases bilaterally. Oxygen saturations WNL on 4 L nasal cannula.   Bedside ultrasound: Several b-lines in multiple lung fields, improved from yesterday's exam. Moderate size left pleural effusion unchanged from previous exam.  Assessment/Plan:  Principal Problem:   Acute on chronic systolic heart failure (HCC) Active Problems:   Essential hypertension   Permanent atrial fibrillation   Non-small cell carcinoma of lung (HCC)   COPD (chronic obstructive pulmonary disease) with emphysema (HCC)   Chronic respiratory failure with hypoxia, on home O2 therapy (HCC)   Physical deconditioning   CAP (community acquired pneumonia)   Malnutrition of moderate degree   Goals of care, counseling/discussion   Palliative care by specialist   Paroxysmal atrial fibrillation [facility])  Cody Lane is admitted for acute hypoxic respiratory failure secondary to acute on chronic systolic CHF, non-small cell lung cancer, and chronic COPD. His oxygen requirements remain elevated despite treatment with IV Lasix for multiple days. He continues  to have a left pleural effusion likely due to lung cancer or CHF exacerbation. I do believe that thoracentesis of the pleural effusion would allow for lung expansion and will overall improve his respiratory status. His wife is amenable to this.   Acute hypoxic respiratory failure: - Start PO lasix 40 mg - Follow-up thoracentesis results - Continue PT - Continue supplemental oxygen as needed  Paroxysmal atrial fibrillation with RVR: Heart rate remains elevated into the 120's-130's on metoprolol. Will hold off on changing the metoprolol or adding another medication till after he receives the thoracentesis. His heart rate may improve with improvement in his respiratory status. - Continue metoprolol 50 mg three times a day. - Continue Eliquis  AKI: Creatinine elevated but stable today.  - Continue to monitor  Hypertension: He has soft blood pressures on lisinopril, metoprolol, Lasix. Will continue to monitor. -Continue lisinopril - Continue metoprolol per above  Non-small cell lung cancer: Dr. Doy Mince (oncologist in [place]) has sent an outpatient referral to oncology and radiation oncology in [place].  Dispo: Anticipated discharge in approximately 3-4 days.  Carroll Sage, MD 01/25/2018, 7:38 AM Pager: [phone]

## 2018-01-25 NOTE — Progress Notes (Signed)
Unit secretary will give patient form and have chaplain paged when ready.  [name], [place], [facility], Pager [phone]

## 2018-01-25 NOTE — Plan of Care (Signed)
  Problem: Health Behavior/Discharge Planning: Goal: Ability to manage health-related needs will improve Outcome: Progressing   Problem: Clinical Measurements: Goal: Will remain free from infection Outcome: Progressing Goal: Diagnostic test results will improve Outcome: Progressing Goal: Respiratory complications will improve Outcome: Progressing Goal: Cardiovascular complication will be avoided Outcome: Progressing   Problem: Activity: Goal: Risk for activity intolerance will decrease Outcome: Progressing   Problem: Nutrition: Goal: Adequate nutrition will be maintained Outcome: Progressing

## 2018-01-25 NOTE — Progress Notes (Signed)
Paged primary team about pts HR 150-160s sustained. Pt refusing to take oral meds at this time. Primary team to place orders as necessary. Will monitor.

## 2018-01-25 NOTE — Progress Notes (Signed)
Pt increasingly lethargic.Pt does nod appropriately to verbal cues but speech is slurred. VSS.  Pupils Equal and reactive. No facial droop present. On call physician made aware and will come to bedside. Will monitor.

## 2018-01-25 NOTE — Progress Notes (Signed)
Paged to bedside that the patient was more lethargic than normal. Nurse notes that at the beginning of her shift the patient was alert and oriented times three and able to hold a conversation however now he appears lethargic and is only mumbling. We presented to the bedside for further evaluation. Upon entering the room the patient was sitting up in bed with his eyes open. He is alert to person but not to place her time. He believes he is at school. He does not follow commands. Wife at bedside states that he has been very sleepy and is just waking up. She is not sure if he is altered or not.  Vitals: HR 104, RR 24, BP 98/70, O2 sat 98% on 2L/min General: Elderly male sitting in bed  Pulm: Tachypnea with bibasilar crackles  CV: Irregularly irregular rhythm Neuro: Alert but only oriented to person, not following commands, appears dysarthric, generalized weakness, pupils equal and reactive to light   Skin: Warm  A/P: Cody Lane is a 78 y.o male with HFrEF, NSCLC, and COPD who presented to the hospital with acute hypoxic respiratory failure felt to be secondary to volume overload with a persistent pleural effusion. He is planning to get a thoracentesis via IR tomorrow. On our assessment he does appear acutely altered. This is likely hospital delirium in a chronically ill patient however given that he has a metabolic alkalosis dating back to admission on 1/9 we will check a VBG to make sure he is not retaining CO2. It seems less likely that he would have an acute worsening of his renal function or other metabolic causes for his altered mental status. He has not received any medications that could be contributing to his altered mental status. While his blood pressure is low on review of records it appears that he is consistently at this level and his MAP is greater than 65. He is on Eliquis for his atrial fibrillation and therefore at increased risk for spontaneous intracranial bleeds. Given his dysarthria we  will pursue a stat CT to rule out intracranial pathology. We will have the night team follow-up these results and reassess the patient.

## 2018-01-25 NOTE — Progress Notes (Addendum)
Per covering day team's request, we re-evaluated the patient at bedside to follow up new AMS noted earlier in the evening. Cody Lane was resting comfortably in bed, leaning to his left. He was oriented to self and place "hospital." Other orientation questions/answers were Situation: "stroke," Year: "2001." His speech was normal, not slurred. He was following all commands. Wife was not at bedside, patient stated that she "Cody Lane" had gone home.   Vitals: HR 113 irregular, RR 30, BP 116/89, O2 95% on 4L [place] General: elderly male, NAD, sitting in bed slouched towards left side Cardiac: tachycardic, irregularly irregular. No m/r/g. Extremities are warm, no LEE Pulm: tachypneic, CTAB, slightly decreased breath sounds on the left Abd: soft, NT, ND Neuro: Alert, oriented to self and place. Slowed demeanor. Speech is without slurring. Following commands and conversing appropriately. PERRL, CN 2-12 grossly intact (refused to smile or puff out his cheeks), limited interaction on finger to nose, moves all extremities to command, grip strength strong and symmetric bilaterally but limited movement in left upper extremity, limited movement in bilateral lower extremities  A/P: Cody Lane is a 78 y.o male with HFrEF, NSCLC, COPD, and previous CVA (presented with diplopia and dizziness, no extremity deficits) who presented to the hospital with acute hypoxic respiratory failure felt to be secondary to volume overload with a persistent pleural effusion. Appears to be at baseline mental/neuro status. CT head was negative intracranial hemorrhage or ischemic changes. VBG shows normal pH and CO2 only slightly elevated. Agree with previous assessment that this is most likely hospital delirium, also consider an element of physical deconditioning. He was evaluated by PT yesterday, would recommend continuing PT. Will continue to monitor him overnight. If day team feels that his neuro exam is not at baseline, they can consider  brain MR tomorrow.    Francesco Runner, MD Internal Medicine, PGY1 Pager: [phone] 01/25/2018, 8:55 PM

## 2018-01-26 ENCOUNTER — Encounter (HOSPITAL_COMMUNITY): Payer: Self-pay | Admitting: Physician Assistant

## 2018-01-26 ENCOUNTER — Inpatient Hospital Stay (HOSPITAL_COMMUNITY): Payer: No Typology Code available for payment source

## 2018-01-26 DIAGNOSIS — J90 Pleural effusion, not elsewhere classified: Secondary | ICD-10-CM

## 2018-01-26 HISTORY — PX: IR THORACENTESIS ASP PLEURAL SPACE W/IMG GUIDE: IMG5380

## 2018-01-26 LAB — LACTATE DEHYDROGENASE, PLEURAL OR PERITONEAL FLUID: LD, Fluid: 321 U/L — ABNORMAL HIGH (ref 3–23)

## 2018-01-26 LAB — CBC WITH DIFFERENTIAL/PLATELET
Abs Immature Granulocytes: 0.06 10*3/uL (ref 0.00–0.07)
Basophils Absolute: 0.1 10*3/uL (ref 0.0–0.1)
Basophils Relative: 1 %
Eosinophils Absolute: 0.2 10*3/uL (ref 0.0–0.5)
Eosinophils Relative: 2 %
HCT: 42.5 % (ref 39.0–52.0)
HEMOGLOBIN: 12.6 g/dL — AB (ref 13.0–17.0)
Immature Granulocytes: 1 %
Lymphocytes Relative: 14 %
Lymphs Abs: 1.6 10*3/uL (ref 0.7–4.0)
MCH: 28.8 pg (ref 26.0–34.0)
MCHC: 29.6 g/dL — ABNORMAL LOW (ref 30.0–36.0)
MCV: 97.3 fL (ref 80.0–100.0)
Monocytes Absolute: 1.1 10*3/uL — ABNORMAL HIGH (ref 0.1–1.0)
Monocytes Relative: 10 %
NEUTROS PCT: 72 %
Neutro Abs: 8.3 10*3/uL — ABNORMAL HIGH (ref 1.7–7.7)
Platelets: 143 10*3/uL — ABNORMAL LOW (ref 150–400)
RBC: 4.37 MIL/uL (ref 4.22–5.81)
RDW: 12.9 % (ref 11.5–15.5)
WBC: 11.3 10*3/uL — ABNORMAL HIGH (ref 4.0–10.5)
nRBC: 0 % (ref 0.0–0.2)

## 2018-01-26 LAB — GRAM STAIN

## 2018-01-26 LAB — BASIC METABOLIC PANEL
ANION GAP: 14 (ref 5–15)
BUN: 34 mg/dL — ABNORMAL HIGH (ref 8–23)
CO2: 32 mmol/L (ref 22–32)
Calcium: 9.1 mg/dL (ref 8.9–10.3)
Chloride: 96 mmol/L — ABNORMAL LOW (ref 98–111)
Creatinine, Ser: 1.64 mg/dL — ABNORMAL HIGH (ref 0.61–1.24)
GFR calc Af Amer: 46 mL/min — ABNORMAL LOW (ref >60)
GFR calc non Af Amer: 40 mL/min — ABNORMAL LOW (ref >60)
Glucose, Bld: 90 mg/dL (ref 70–99)
Potassium: 4.6 mmol/L (ref 3.5–5.1)
Sodium: 142 mmol/L (ref 135–145)

## 2018-01-26 LAB — BODY FLUID CELL COUNT WITH DIFFERENTIAL
Eos, Fluid: 0 %
Lymphs, Fluid: 82 %
Monocyte-Macrophage-Serous Fluid: 3 % — ABNORMAL LOW (ref 50–90)
Neutrophil Count, Fluid: 15 % (ref 0–25)
Other Cells, Fluid: 0 %
Total Nucleated Cell Count, Fluid: 3550 cu mm — ABNORMAL HIGH (ref 0–1000)

## 2018-01-26 LAB — PROTEIN, PLEURAL OR PERITONEAL FLUID: Total protein, fluid: 3.1 g/dL

## 2018-01-26 LAB — GLUCOSE, PLEURAL OR PERITONEAL FLUID: Glucose, Fluid: 106 mg/dL

## 2018-01-26 MED ORDER — LIDOCAINE HCL 1 % IJ SOLN
INTRAMUSCULAR | Status: AC
  Filled 2018-01-26: qty 20

## 2018-01-26 MED ORDER — ONDANSETRON HCL 4 MG/2ML IJ SOLN
4.0000 mg | Freq: Three times a day (TID) | INTRAMUSCULAR | Status: DC | PRN
  Administered 2018-01-26: 4 mg via INTRAVENOUS
  Filled 2018-01-26: qty 2

## 2018-01-26 MED ORDER — ONDANSETRON HCL 4 MG/2ML IJ SOLN
INTRAMUSCULAR | Status: AC
  Filled 2018-01-26: qty 2

## 2018-01-26 MED ORDER — METOPROLOL TARTRATE 5 MG/5ML IV SOLN
5.0000 mg | Freq: Once | INTRAVENOUS | Status: AC
  Administered 2018-01-26: 5 mg via INTRAVENOUS
  Filled 2018-01-26: qty 5

## 2018-01-26 MED ORDER — PRO-STAT SUGAR FREE PO LIQD
30.0000 mL | Freq: Three times a day (TID) | ORAL | Status: DC
  Administered 2018-01-26 – 2018-02-06 (×13): 30 mL via ORAL
  Filled 2018-01-26 (×24): qty 30

## 2018-01-26 NOTE — Procedures (Signed)
PROCEDURE SUMMARY:  Successful image-guided left thoracentesis. Yielded 900 milliliters of yellow fluid. Patient tolerated procedure well. EBL: Zero No immediate complications.  Specimen was sent for labs. Post procedure CXR shows no pneumothorax.  Please see imaging section of Epic for full dictation.  Joaquim Nam PA-C 01/26/2018 1:26 PM

## 2018-01-26 NOTE — Progress Notes (Addendum)
Nutrition Follow-up  DOCUMENTATION CODES:   Non-severe (moderate) malnutrition in context of chronic illness  INTERVENTION:   -Continue Ensure Enlive po BID, each supplement provides 350 kcal and 20 grams of protein -Continue MVI with minerals daily -Continue Magic Cup TID with meals, each supplement provides 290 kcals and 15 grams protein -Hormel Shake TID with meals, each supplement provides 520 kcals and 22 grams protein -30 ml Prostat TID, each supplement provides 100 kcals and 15 grams protein  NUTRITION DIAGNOSIS:   Moderate Malnutrition related to chronic illness(CHF, COPD, lung cancer) as evidenced by energy intake < or equal to 75% for > or equal to 1 month, mild fat depletion, moderate fat depletion, mild muscle depletion, moderate muscle depletion, percent weight loss.  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 Fib, history of aortic valve replacement.  As noted he was recently discharged 3 days ago after hospitalization for hypoxia and pneumonia as well as acute metabolic encephalopathy due to those conditions.  At that hospitalization he ended up being discharged on oral antibiotics and with home oxygen therapy.  However this morning he removed his oxygen to go to the bathroom in the bathroom he developed left-sided chest pain and felt that he had a "heart trouble" additionally his wife [name] reports to me that she is very concerned about his weakness which also prompted her to bring him in for evaluation.  1/14- s/p lt thoracentesis (900 ml yielded)    Reviewed I/O's: +35 ml x 24 hours and -1.6 L since admission  Pt in with MD at time of visit. Unable to speak with pt or family (who was not present).   Appetite remains poor; noted meal completion 0-50%. Pt has refused the last 3 doses of Ensure. Per RN, notes swallow evaluation has been ordered due to pt coughing while eating.   Labs reviewed.   Diet Order:   Diet Order            Diet regular Room service appropriate? Yes; Fluid consistency: Thin  Diet effective now              EDUCATION NEEDS:   Education needs have been addressed  Skin:  Skin Assessment: Skin Integrity Issues: Skin Integrity Issues:: Other (Comment) Other: MASD groin  Last BM:  01/21/18  Height:   Ht Readings from Last 1 Encounters:  01/20/18 6\' 1"  (1.854 m)    Weight:   Wt Readings from Last 1 Encounters:  01/26/18 73.3 kg    Ideal Body Weight:  83.6 kg  BMI:  Body mass index is 21.32 kg/m.  Estimated Nutritional Needs:   Kcal:  2250-2450  Protein:  115-130 grams  Fluid:  > 2.2 L    Ky Moskowitz A. Jimmye Norman, RD, LDN, CDE Pager: [phone] After hours Pager: [phone]

## 2018-01-26 NOTE — Progress Notes (Signed)
RN noted patient coughing while eating, despite being upright in bed. RN paged primary team for SLP order.

## 2018-01-26 NOTE — Plan of Care (Signed)
  Problem: Clinical Measurements: Goal: Will remain free from infection Outcome: Progressing Goal: Diagnostic test results will improve Outcome: Progressing Goal: Respiratory complications will improve Outcome: Progressing Goal: Cardiovascular complication will be avoided Outcome: Progressing   Problem: Nutrition: Goal: Adequate nutrition will be maintained Outcome: Progressing   Problem: Elimination: Goal: Will not experience complications related to bowel motility Outcome: Progressing Goal: Will not experience complications related to urinary retention Outcome: Progressing   Problem: Safety: Goal: Ability to remain free from injury will improve Outcome: Progressing   Problem: Skin Integrity: Goal: Risk for impaired skin integrity will decrease Outcome: Progressing

## 2018-01-26 NOTE — Progress Notes (Signed)
Patient refusing IV medications at this time. Will attempt later

## 2018-01-26 NOTE — Progress Notes (Signed)
   Subjective: Cody Lane was noted to be more lethargic overnight. Head CT performed showed a right lacunar infarct of indeterminate age--not seen on 10/15/2017 CT. No focal neurologic symptoms on exam. His symptoms were thought to be due to hospital delirium and physical deconditioning.   Cody Lane states that he is doing well this morning.  He believes that his shortness of breath is similar to how is been a last several days.  He understands that he is in the hospital because of his breathing issues.  And when asked what we have done to help treat him he states "give me lots of pills.  He does not have any acute complaints.  Objective:  Vital signs in last 24 hours: Vitals:   01/26/18 0300 01/26/18 0734 01/26/18 0739 01/26/18 1037  BP: (!) 113/96 119/88  102/88  Pulse: 86 (!) 35  99  Resp: 20 (!) 26 (!) 28 (!) 24  Temp: 97.6 F (36.4 C)  97.8 F (36.6 C)   TempSrc: Oral  Oral   SpO2: 97% 96%  99%  Weight: 73.3 kg     Height:       General: Sitting up in bed in no acute distress. CV: Irregular rhythm, tachycardic @ 120-130. Resp: Oxygen saturations >97% on 3-4 L supplemental oxygen. Diminished breath sounds at the lung bases bilaterally. MSK: Trace LE edema bilaterally, No tenderness to palpation  Assessment/Plan:  Principal Problem:   Acute on chronic systolic heart failure (HCC) Active Problems:   Essential hypertension   Non-small cell carcinoma of lung (HCC)   COPD (chronic obstructive pulmonary disease) with emphysema (HCC)   Chronic respiratory failure with hypoxia, on home O2 therapy (HCC)   Physical deconditioning   CAP (community acquired pneumonia)   Malnutrition of moderate degree   Goals of care, counseling/discussion   Palliative care by specialist   Paroxysmal atrial fibrillation Cody Lane)  Cody Lane is admitted for acute hypoxic respiratory failure secondary to acute on chronic systolic CHF, non-small cell lung cancer, and chronic COPD.   Acute hypoxic  respiratory failure: He continues to require increased oxygen requirements.  I suspect that this is due to the persistent pleural effusion and will improve with therapeutic/diagnostic thoracentesis. He does appear euvolemic on exam. Discontinued Lasix and lisinopril today in the setting of hypotension. -Discontinue PO Lasix -Follow-up thoracentesis results -Continue PT -Continue supplemental oxygen as needed  Paroxysmal atrial fibrillation with RVR: Heart rate remains elevated despite treatment of metoprolol.  I will order 1 dose of IV metoprolol 5 mg today and adjust antiarrhythmic medications as needed after IR thoracentesis. - Continue metoprolol 50 mg 3 times daily - Continue Eliquis - Give 1 dose of IV metoprolol 5 mg. - We will continue to monitor heart rate closely throughout the day  ZOX:[id number] increased today to 1.64.  This is likely due to acute tubular necrosis 2/2 hypotension.  I have discontinued both lisinopril and Lasix.  He does appear euvolemic on exam I do not believe that his AKI is due to fluid overload. - Continue to monitor  Hypertension: Holding lisinopril and Lasix in the setting of hypotension.  We will continue to monitor closely throughout the day.  Non-small cell lung cancer: Dr. Doy Mince (oncologist in [place]) has sent an outpatient referral to oncology and radiation oncology in [place].  Dispo: Anticipated discharge in approximately 2-3 days.  Carroll Sage, MD 01/26/2018, 10:53 AM Pager: [phone]

## 2018-01-26 NOTE — Progress Notes (Signed)
Paged primary team regarding patient's fluctuating mental status and increased need for oxygen. Patient now maintaining O2 sats in the high 80s on 5L nasal cannula. Will monitor closely

## 2018-01-26 NOTE — Progress Notes (Signed)
Patient of the bedside that the patient was more altered this afternoon and needed increased supplemental oxygen requirements.  Prior to the thoracentesis this afternoon he was on 3 to 4 L supplemental oxygen.  He had 900 mils of pleural fluid removed without complication.  Post thoracentesis he has needed increased oxygen requirements up to 5 L.  CXR showed resolution of his left pleural effusion and no pneumothorax.  I presented to the bedside for further evaluation.  Upon entering the room the patient was sitting up in bed with eyes open and in no acute distress.  He is alert to person but not place.  He is able to sit tell me his wife's name [name].  His wife states that he has been a bit more sleepy this afternoon but is much better than he was last night.  She does state that he has had issues with remembering where he is that even prior to his initial hospitalization several months ago.  Vitals: HR 110-115, RR 24, BP 84/60, MAP 70, O2 91% on 5L [place] General: Elderly male, sitting up in bed in no acute distress Cardiovascular: Tachycardic, irregularly irregular rhythm, no lower extremity edema bilaterally Pulmonary: Decreased breath sounds at the lung bases bilaterally.  Auscultated anteriorly and lungs are clear to auscultation bilaterally. Neuro: Alert, oriented to self but not place.  He does seem a bit more sleepy.  He closes his eyes throughout our conversation but easily arouses to verbal stimuli.   A/P: Mr. Rundle has HFrEF, non-small cell lung cancer, and advanced COPD and is admitted for acute hypoxic respiratory failure secondary to acute on chronic congestive heart failure, lung cancer, and COPD.  He does appear more altered this afternoon but is still able to open his eyes and speak to me.  He does not however remember where he is.  CT head performed last night was negative for acute intracranial hemorrhage or ischemic changes. He did have signs of an old lacunar infarct but it is difficult  to determine when this occurred. I do believe that he has underlying dementia and his symptoms are due to hospital delirium. Will continue to monitor his vital signs and mental status closely throughout the night. Goals are O2 saturations >88% on supplemental oxygen and MAPS >65.

## 2018-01-26 NOTE — Clinical Social Work Note (Signed)
Clinical Social Work Assessment  Patient Details  Name: Cody Lane MRN: [id number] Date of Birth: [birth year]  Date of referral:  01/25/18               Reason for consult:  Facility Placement                Permission sought to share information with:  Facility Sport and exercise psychologist Permission granted to share information::  Yes, Verbal Permission Granted  Name::     Cody Lane, Cody Lane  Agency::  SNFs  Relationship::  spouse  Contact Information:  [phone]  Housing/Transportation Living arrangements for the past 2 months:  Single Family Home Source of Information:  Spouse Patient Interpreter Needed:  None Criminal Activity/Legal Involvement Pertinent to Current Situation/Hospitalization:  No - Comment as needed Significant Relationships:  None Lives with:  Spouse Do you feel safe going back to the place where you live?  No Need for family participation in patient care:  Yes (Comment)  Care giving concerns:  CSW received consult for discharge needs. CSW spoke with patient's wife  regarding PT recommendation of SNF placement at time of discharge. Patient wife states they live in single level home. Patient has ramp to enter the home.    Social Worker assessment / plan: CSW visit with the patient at bedside. Patient states he was eating his lunch but has not eaten his food. He appeared very confused. His wife encouraged him to eat his lunch. Patient's wife states he mother, daughter, sister and grandchildren are supportive and assist when needed.   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 The [place] care coordinator  That has been assisting them with getting the patient's treatment (chemo) treatments at [facility] is Robert [phone] ext [phone].    Employment status:  Retired Forensic scientist:  Medicare PT Recommendations:  [place] / Referral to community resources:  [place]  Patient/Family's Response to  care: Patient recognizes need for rehab before returning home and is agreeable to a SNF placement. Patient reported preference for [facility].   CSW contacted [facility] and they confirm they could accept patient, however, they will need to know how often the patient will go for treatment. If its multiple times during the week, the patient's family will need to provide transportation. CSW explained this to the patient's wife. She states based on previous recommendations for treatment, he will go 1x every 3 weeks. If this is correct [facility] states they can provide transportation.   Patient/Family's Understanding of and Emotional Response to Diagnosis, Current Treatment, and Prognosis:  Patient's spouse is realistic regarding therapy needs and expressed being hopeful for SNF placement. Patient's spouse  expressed understanding of CSW role and discharge process as well as medical condition. No questions/concerns about plan or treatment at this time.   Emotional Assessment Appearance:  Appears stated age Attitude/Demeanor/Rapport:  Other(confused) Affect (typically observed):  Other(patient was pleasant but confused) Orientation:  Oriented to Self Alcohol / Substance use:  Not Applicable Psych involvement (Current and /or in the community):  No (Comment)  Discharge Needs  Concerns to be addressed:  Care Coordination Readmission within the last 30 days:  No Current discharge risk:  Dependent with Mobility Barriers to Discharge:  Continued Medical Work up   [employer], [employer] [place] 01/26/2018, 3:58 PM

## 2018-01-27 ENCOUNTER — Encounter (HOSPITAL_COMMUNITY): Payer: Self-pay | Admitting: Radiology

## 2018-01-27 ENCOUNTER — Inpatient Hospital Stay (HOSPITAL_COMMUNITY): Payer: No Typology Code available for payment source

## 2018-01-27 ENCOUNTER — Ambulatory Visit
Admit: 2018-01-27 | Discharge: 2018-01-27 | Disposition: A | Discharge status: Home / Self Care | Payer: Medicare Other | Attending: Radiation Oncology | Admitting: Radiation Oncology

## 2018-01-27 DIAGNOSIS — J9601 Acute respiratory failure with hypoxia: Secondary | ICD-10-CM

## 2018-01-27 DIAGNOSIS — C3490 Malignant neoplasm of unspecified part of unspecified bronchus or lung: Secondary | ICD-10-CM

## 2018-01-27 DIAGNOSIS — R410 Disorientation, unspecified: Secondary | ICD-10-CM

## 2018-01-27 DIAGNOSIS — Z9889 Other specified postprocedural states: Secondary | ICD-10-CM

## 2018-01-27 LAB — BASIC METABOLIC PANEL
Anion gap: 8 (ref 5–15)
BUN: 40 mg/dL — AB (ref 8–23)
CO2: 36 mmol/L — ABNORMAL HIGH (ref 22–32)
Calcium: 8.9 mg/dL (ref 8.9–10.3)
Chloride: 96 mmol/L — ABNORMAL LOW (ref 98–111)
Creatinine, Ser: 1.73 mg/dL — ABNORMAL HIGH (ref 0.61–1.24)
GFR calc Af Amer: 43 mL/min — ABNORMAL LOW (ref >60)
GFR calc non Af Amer: 37 mL/min — ABNORMAL LOW (ref >60)
Glucose, Bld: 101 mg/dL — ABNORMAL HIGH (ref 70–99)
Potassium: 4.6 mmol/L (ref 3.5–5.1)
Sodium: 140 mmol/L (ref 135–145)

## 2018-01-27 LAB — CBC WITH DIFFERENTIAL/PLATELET
Abs Immature Granulocytes: 0.05 10*3/uL (ref 0.00–0.07)
Basophils Absolute: 0.1 10*3/uL (ref 0.0–0.1)
Basophils Relative: 1 %
Eosinophils Absolute: 0.2 10*3/uL (ref 0.0–0.5)
Eosinophils Relative: 2 %
HCT: 38.8 % — ABNORMAL LOW (ref 39.0–52.0)
Hemoglobin: 11.6 g/dL — ABNORMAL LOW (ref 13.0–17.0)
Immature Granulocytes: 1 %
Lymphocytes Relative: 15 %
Lymphs Abs: 1.5 10*3/uL (ref 0.7–4.0)
MCH: 29.1 pg (ref 26.0–34.0)
MCHC: 29.9 g/dL — ABNORMAL LOW (ref 30.0–36.0)
MCV: 97.2 fL (ref 80.0–100.0)
Monocytes Absolute: 1 10*3/uL (ref 0.1–1.0)
Monocytes Relative: 10 %
Neutro Abs: 7.4 10*3/uL (ref 1.7–7.7)
Neutrophils Relative %: 71 %
Platelets: 135 10*3/uL — ABNORMAL LOW (ref 150–400)
RBC: 3.99 MIL/uL — AB (ref 4.22–5.81)
RDW: 12.6 % (ref 11.5–15.5)
WBC: 10.2 10*3/uL (ref 4.0–10.5)
nRBC: 0 % (ref 0.0–0.2)

## 2018-01-27 MED ORDER — GADOBUTROL 1 MMOL/ML IV SOLN
7.0000 mL | Freq: Once | INTRAVENOUS | Status: AC | PRN
  Administered 2018-01-27: 7 mL via INTRAVENOUS

## 2018-01-27 MED ORDER — LEVALBUTEROL HCL 0.63 MG/3ML IN NEBU
0.6300 mg | INHALATION_SOLUTION | RESPIRATORY_TRACT | Status: DC | PRN
  Administered 2018-02-07 – 2018-02-09 (×3): 0.63 mg via RESPIRATORY_TRACT
  Filled 2018-01-27 (×4): qty 3

## 2018-01-27 MED ORDER — AEROCHAMBER PLUS FLO-VU MEDIUM MISC
1.0000 | Freq: Once | Status: DC
  Filled 2018-01-27: qty 1

## 2018-01-27 NOTE — Progress Notes (Signed)
I have reivewed MRI results, suspect small infarction is watershed due to his previous hypotension in setting of afib w RVR. BP has since improved. He is on eliquis and atorvastatin.  Given his advanced medical illness no further intervention would be pursued.   Also have discussed plans with Rad Onc PA plan to start palliative radiation on Monday, would like [place] to transfer patient to [place] [facility].  When that happens we will need to arrange transfer of care to triad hospitalist.  Lucious Groves, DO

## 2018-01-27 NOTE — Progress Notes (Addendum)
Nutrition Follow-up  DOCUMENTATION CODES:   Non-severe (moderate) malnutrition in context of chronic illness  INTERVENTION:   -RD will follow for diet advancement and resume supplements as appropriate -If pt and family desire TF and is within goals of care, recommend:  Initiate Osmolite 1.5 @ 20 ml/hr and increase by 10 ml every 8 hours to goal rate of 60 ml/hr.   30 ml Prostat BID.    Tube feeding regimen provides 2360 kcal (100% of needs), 120 grams of protein, and 1097 ml of H2O.   -If TF is initiated, monitor K, Mg, and Phos daily x 3 days and replete as needed due to high refeeding risk  NUTRITION DIAGNOSIS:   Moderate Malnutrition related to chronic illness(CHF, COPD, lung cancer) as evidenced by energy intake < or equal to 75% for > or equal to 1 month, mild fat depletion, moderate fat depletion, mild muscle depletion, moderate muscle depletion, percent weight loss.  Ongoing  GOAL:   Patient will meet greater than or equal to 90% of their needs  Unmet  MONITOR:   PO intake, Supplement acceptance, Labs, Weight trends, Skin, I & O's  REASON FOR ASSESSMENT:   Consult Assessment of nutrition requirement/status  ASSESSMENT:   78 year old male with a past medical history significant for non-small cell lung cancer that has been recently diagnosed, COPD with emphysema, chronic congestive heart failure with reduced ejection fraction, A. Fib, history of aortic valve replacement.  As noted he was recently discharged 3 days ago after hospitalization for hypoxia and pneumonia as well as acute metabolic encephalopathy due to those conditions.  At that hospitalization he ended up being discharged on oral antibiotics and with home oxygen therapy.  However this morning he removed his oxygen to go to the bathroom in the bathroom he developed left-sided chest pain and felt that he had a "heart trouble" additionally his wife [name] reports to me that she is very concerned about his  weakness which also prompted her to bring him in for evaluation.  1/14- s/p lt thoracentesis (900 ml yielded)  1/16- s/p BSE- recommend NPO status; may pursue instrumental testing if pt and family desire; MRI revealed small acute /early subacute infarction within the bilateral , posterior, frontal lobe   Reviewed I/O's: +280 ml x 24 hours and -1.4 L since admission  Intake remains poor (see RD note from yesterday, 01/26/18). Per MD notes, plan to transfer to [facility] for palliative radiation.   Depending on pt's goals of care, pt may benefit from either temporary or more permanent means of nutrition, especially given prolonged PO intake, recommended NPO status, and malnutrition.   Labs reviewed.   Diet Order:   Diet Order            Diet regular Room service appropriate? Yes; Fluid consistency: Thin  Diet effective now              EDUCATION NEEDS:   Education needs have been addressed  Skin:  Skin Assessment: Skin Integrity Issues: Skin Integrity Issues:: Other (Comment) Other: MASD groin  Last BM:  01/25/18  Height:   Ht Readings from Last 1 Encounters:  01/20/18 6\' 1"  (1.854 m)    Weight:   Wt Readings from Last 1 Encounters:  01/27/18 73.5 kg    Ideal Body Weight:  83.6 kg  BMI:  Body mass index is 21.38 kg/m.  Estimated Nutritional Needs:   Kcal:  2250-2450  Protein:  115-130 grams  Fluid:  > 2.2 L  Jamas Jaquay A. Jimmye Norman, RD, LDN, CDE Pager: [phone] After hours Pager: [phone]

## 2018-01-27 NOTE — Progress Notes (Signed)
Occupational Therapy Treatment Patient Details Name: Cody Lane MRN: [id number] DOB: [birth year] Today's Date: 01/27/2018    History of present illness  78 year old male with a past medical history significant for non-small cell lung cancer that has been recently diagnosed, COPD with emphysema, chronic congestive heart failure with reduced ejection fraction, A. Fib, history of aortic valve replacement.  As noted he was recently discharged 3 days ago after hospitalization for hypoxia and pneumonia as well as acute metabolic encephalopathy due to those conditions.  At that hospitalization he ended up being discharged on oral antibiotics and with home oxygen therapy.  At home he removed his oxygen to go to the bathroom in the bathroom he developed left-sided chest pain.    OT comments  Pt with functional decline. Continues to demonstrate impaired cognition and generalized weakness. Pt needing max assist for feeding and grooming, total assist for donning socks and 2 person moderate assistance to transfer to chair. Pt with resting HR in 120s, increased to 150 with activity. Wife at bedside and does not appear to appreciate pt's decline. Continues to need SNF.   Follow Up Recommendations  SNF;Supervision/Assistance - 24 hour    Equipment Recommendations       Recommendations for Other Services      Precautions / Restrictions Precautions Precautions: Fall Precaution Comments: watch HR, 02 dependent       Mobility Bed Mobility Overal bed mobility: Needs Assistance Bed Mobility: Supine to Sit     Supine to sit: Max assist     General bed mobility comments: max assist and multimodal cues   Transfers Overall transfer level: Needs assistance Equipment used: 2 person hand held assist Transfers: Sit to/from Stand;Stand Pivot Transfers Sit to Stand: +2 physical assistance;Mod assist Stand pivot transfers: +2 physical assistance;Mod assist       General transfer comment: attempted x 2  with walker unsuccessfully, pt returning to sitting almost immediately    Balance Overall balance assessment: Needs assistance;History of Falls   Sitting balance-Leahy Scale: Fair Sitting balance - Comments: flexed posture, elbows on knees     Standing balance-Leahy Scale: Zero Standing balance comment: +2 mod assist for balance                           ADL either performed or assessed with clinical judgement   ADL Overall ADL's : Needs assistance/impaired Eating/Feeding: Maximal assistance;Sitting   Grooming: Wash/dry hands;Wash/dry face;Sitting;Maximal assistance               Lower Body Dressing: Total assistance;Bed level                       Vision       Perception     Praxis      Cognition Arousal/Alertness: Awake/alert Behavior During Therapy: Flat affect Overall Cognitive Status: Impaired/Different from baseline Area of Impairment: Orientation;Memory;Attention;Following commands;Safety/judgement;Problem solving;Awareness                 Orientation Level: Situation;Time Current Attention Level: Focused Memory: Decreased short-term memory Following Commands: Follows one step commands inconsistently Safety/Judgement: Decreased awareness of safety;Decreased awareness of deficits Awareness: Intellectual Problem Solving: Slow processing;Difficulty sequencing;Requires verbal cues;Requires tactile cues;Decreased initiation          Exercises     Shoulder Instructions       General Comments      Pertinent Vitals/ Pain       Pain Assessment: Faces Faces Pain  Scale: No hurt  Home Living                                          Prior Functioning/Environment              Frequency  Min 2X/week        Progress Toward Goals  OT Goals(current goals can now be found in the care plan section)  Progress towards OT goals: Not progressing toward goals - comment(cognitive worsening)  Acute Rehab  OT Goals Patient Stated Goal: to get stronger Time For Goal Achievement: 02/07/18 Potential to Achieve Goals: Cody Lane Discharge plan remains appropriate    Co-evaluation                 AM-PAC OT "6 Clicks" Daily Activity     Outcome Measure   Help from another person eating meals?: A Lot Help from another person taking care of personal grooming?: Total Help from another person toileting, which includes using toliet, bedpan, or urinal?: Total Help from another person bathing (including washing, rinsing, drying)?: Total Help from another person to put on and taking off regular upper body clothing?: A Lot Help from another person to put on and taking off regular lower body clothing?: Total 6 Click Score: 8    End of Session Equipment Utilized During Treatment: Gait belt;Oxygen  OT Visit Diagnosis: Unsteadiness on feet (R26.81);Muscle weakness (generalized) (M62.81)   Activity Tolerance Patient limited by fatigue   Patient Left in chair;with call bell/phone within reach;with chair alarm set;with family/visitor present   Nurse Communication Mobility status        Time: 1002-1030 OT Time Calculation (min): 28 min  Charges: OT General Charges $OT Visit: 1 Visit OT Treatments $Self Care/Home Management : 23-37 mins  Nestor Lewandowsky, OTR/L Acute Rehabilitation Services Pager: [phone] Office: [phone]  Malka So 01/27/2018, 10:36 AM

## 2018-01-27 NOTE — Evaluation (Addendum)
Clinical/Bedside Swallow Evaluation Patient Details  Name: Cody Lane MRN: [id number] Date of Birth: 01-07-1941  Today's Date: 01/27/2018 Time: SLP Start Time (ACUTE ONLY): 92 SLP Stop Time (ACUTE ONLY): 1300 SLP Time Calculation (min) (ACUTE ONLY): 30 min  Past Medical History:  Past Medical History:  Diagnosis Date  . Aortic valve regurgitation 01/26/14  . Atrial fibrillation ([place])   . 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 CHF (congestive heart failure) ([place]) 01/26/2014  . COPD (chronic obstructive pulmonary disease) ([place])   . CVA (cerebral vascular accident) ([place]) 10/2017   "left side still weak" (01/20/2018)  . Emphysema (subcutaneous) (surgical) resulting from a procedure   . Heart murmur   . Hypertension   . 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 Lung cancer ([place]) dx'd 12/2017  . Lung mass   . Mitral valve regurgitation 01/26/2014  . Myocardial infarction ([place])    "I've had 2; last 1 was ~ 1 wk ago" (01/20/2018)  . On home oxygen therapy    "2L; 24/7" (01/20/2018)  . Pneumonia    "several times" (01/20/2018)  . Pseudophakia 08/17/2013  . 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 Thoracic aortic aneurysm ([place])   . Vitamin D deficiency    Past Surgical History:  Past Surgical History:  Procedure Laterality Date  . AORTIC VALVE REPLACEMENT    . CARDIAC VALVE REPLACEMENT    . CATARACT EXTRACTION W/ INTRAOCULAR LENS  IMPLANT, BILATERAL    . CORONARY ANGIOPLASTY WITH STENT PLACEMENT     "I have 2 stents" (01/20/2018)  . INGUINAL HERNIA REPAIR Left   . IR THORACENTESIS ASP PLEURAL SPACE W/IMG GUIDE  01/26/2018   HPI:  78 year old male admitted 01/20/2018 PMH: non-small cell lung cancer, COPD, emphysema, AFib, AVR, aneurysm, CHF, HTN, A&MVRegurg, HLD, recurrent PNA. Recent hospitalization for hypoxia and PNA amd acute metabolic encephalopathy. CXR = interstitial pulmonary edema and left lower lobe opacity which could reflect alveolar edema or pneumonia. 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   Assessment / Plan /  Recommendation Clinical Impression  Suction was set up to facilitate thorough oral care to minimize bacterial load, and clearing of secretions pt coughs up. Pt presents with missing dentition, however, wife reports he has been tolerating regular foods and thin liquids. His appetite has been minimal. Oral motor strength and function is generally weak. Of note, pt kept removing his nasal cannula during this assessment.  Pt accepted a small bolus of puree, but would not swallow, even with encouragement by SLP and wife. Pt repeatedly indicated "no" when asked to swallow. Congested cough response was noted in the absense of swallow reflex, likely due to thinning of puree with secretions, then posterior spilllage over the tongue base. Puree was suctioned from pt mouth. Pt accepted a small bolus of thin liquid, which resulted in similar fashion. No swallow reflex was appreciated, and congested cough was noted. Pt agreed when asked if he was afraid to swallow because he knew he would have trouble.    Pt has a diagnosis of COPD, which increases risk for silent aspiration, and has had recurrent PNA. He is significantly weak, and at very high risk of aspiration. Completion of instrumental examination is recommended to objectively assess swallow function and safety, however, given pt apprehension towards swallowing anything, his participation is questionable.  Team was notified of SLP results and recommendations.     SLP Visit Diagnosis: Dysphagia, unspecified (R13.10)    Aspiration Risk  Severe aspiration risk;Risk for inadequate nutrition/hydration    Diet Recommendation  NPO   Medication Administration: Via alternative means    Other  Recommendations MBS if within pt/family wishes  Follow up Recommendations (TBD)      Frequency and Duration min 1 x/week  1 week;2 weeks       Prognosis Prognosis for Safe Diet Advancement: Guarded Barriers to Reach Goals: Cognitive deficits;Time post onset;Severity  of deficits      Swallow Study   General Date of Onset: 01/20/18 HPI: 78 year old male admitted 01/20/2018 PMH: non-small cell lung cancer, COPD, emphysema, AFib, AVR, aneurysm, CHF, HTN, A&MVRegurg, HLD, recurrent PNA. Recent hospitalization for hypoxia and PNA amd acute metabolic encephalopathy. CXR = interstitial pulmonary edema and left lower lobe opacity which could reflect alveolar edema or pneumonia. 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/16/17 =  Type of Study: Bedside Swallow Evaluation Previous Swallow Assessment: none - SLE October 2019 Diet Prior to this Study: Regular;Thin liquids Temperature Spikes Noted: No Respiratory Status: Nasal cannula History of Recent Intubation: No Behavior/Cognition: Alert;Cooperative;Requires cueing;Doesn't follow directions;Confused;Uncooperative Oral Cavity Assessment: Within Functional Limits Oral Care Completed by SLP: Yes Oral Cavity - Dentition: Missing dentition Self-Feeding Abilities: Total assist Patient Positioning: Upright in bed Baseline Vocal Quality: Low vocal intensity Volitional Cough: Strong;Congested Volitional Swallow: Unable to elicit    Oral/Motor/Sensory Function Overall Oral Motor/Sensory Function: Generalized oral weakness   Ice Chips Ice chips: Not tested   Thin Liquid Thin Liquid: Impaired Presentation: Cup Oral Phase Functional Implications: Oral holding Pharyngeal  Phase Impairments: Suspected delayed Swallow    Nectar Thick Nectar Thick Liquid: Not tested   Honey Thick Honey Thick Liquid: Not tested   Puree Puree: Impaired Oral Phase Functional Implications: Oral holding Pharyngeal Phase Impairments: Suspected delayed Swallow;Cough - Delayed   Solid     Solid: Not tested     Celia B. Quentin Ore, [facility] [place] Speech Language Pathologist [phone]  Shonna Chock 01/27/2018,1:16 PM

## 2018-01-27 NOTE — Progress Notes (Signed)
   Subjective: Mr. Knee states that he is doing well this morning.  He believes that his shortness of breath is unchanged from yesterday.  He does not have any other acute complaints.  His wife believes that he does seem more alert and is encouraged that he knows where he is at this morning.  She is concerned that he is still requiring a significant amount of supplemental oxygen despite thoracentesis.   Objective:  Vital signs in last 24 hours: Vitals:   01/27/18 0300 01/27/18 0736 01/27/18 0847 01/27/18 1008  BP: (!) 116/92  101/78 98/77  Pulse: (!) 106   (!) 131  Resp: 18     Temp: 98.6 F (37 C)  97.8 F (36.6 C)   TempSrc: Oral  Axillary   SpO2: 97% 94%    Weight: 73.5 kg     Height:       General: Sitting in bed in no acute distress Cardiovascular: Tachycardic and irregularly irregular rhythm Respiratory: Diminished lung sounds at the lung bases (right>left), oxygen saturations above 94% on 4 to 5 L supplemental oxygen. Neuro: Alert and oriented to person and place.  He does seem less confused this morning.  Assessment/Plan:  Principal Problem:   Acute on chronic systolic heart failure (HCC) Active Problems:   Essential hypertension   Non-small cell carcinoma of lung (HCC)   COPD (chronic obstructive pulmonary disease) with emphysema (HCC)   Chronic respiratory failure with hypoxia, on home O2 therapy (HCC)   Physical deconditioning   CAP (community acquired pneumonia)   Malnutrition of moderate degree   Goals of care, counseling/discussion   Palliative care by specialist   Paroxysmal atrial fibrillation ([place])   Pleural effusion  Mr. Trouten is admitted for acute hypoxic respiratory failure secondary to acute on chronic systolic CHF, non-small cell lung cancer, and chronic COPD.   Acute hypoxic respiratory failure: Thoracentesis performed yesterday was exudative and likely due to his underlying malignancy. Repeat CXR showed resolution of pleural effusion and no  signs of pneumothorax. I was hoping that his respiratory status would improve after his procedure but he does seem to require more supplemental oxygen today (4-5L). Will obtain a pulmonary consult for further recommendations.  -Follow-up pulmonary recs -Continue PT -Continue supplemental oxygen as needed  Paroxysmal atrial fibrillation with RVR: Heart rate remains elevated today but have overall improved. Will continue current medications per below. - Continue metoprolol 50 mg 3 times daily - Continue Eliquis  OIT:GPQDIYMEBR increased today to 1.73. I do believe that this is likely due to poor PO intake but will continue to monitor for now as he has had issues with fluid overload and additional IVFs may exacerbate this. - Continue to monitor  Hypertension: Continue to have low blood pressures. Will continue holding lisinopril and lasix for now.  Non-small cell lung cancer: He has an appointment with [facility] oncology on Monday January 20th.   Dispo: Anticipated discharge in approximately 3-5 days.  Carroll Sage, MD 01/27/2018, 10:41 AM Pager: [phone]

## 2018-01-27 NOTE — Progress Notes (Signed)
Physical Therapy Treatment Patient Details Name: Cody Lane MRN: [id number] DOB: 16-Jun-1940 Today's Date: 01/27/2018    History of Present Illness  78 year old male with a past medical history significant for non-small cell lung cancer that has been recently diagnosed, COPD with emphysema, chronic congestive heart failure with reduced ejection fraction, A. Fib, history of aortic valve replacement.  As noted he was recently discharged 3 days ago after hospitalization for hypoxia and pneumonia as well as acute metabolic encephalopathy due to those conditions.  At that hospitalization he ended up being discharged on oral antibiotics and with home oxygen therapy.  At home he removed his oxygen to go to the bathroom in the bathroom he developed left-sided chest pain.     PT Comments    Pt continues to make little progress and requires significant assist. Continue to recommend SNF.    Follow Up Recommendations  SNF;Supervision/Assistance - 24 hour     Equipment Recommendations  Other (comment)(TBD)    Recommendations for Other Services       Precautions / Restrictions Precautions Precautions: Fall Precaution Comments: watch HR, 02 dependent    Mobility  Bed Mobility Overal bed mobility: Needs Assistance Bed Mobility: Sit to Supine       Sit to supine: Max assist   General bed mobility comments: Assist to lower trunk and bring legs up into bed  Transfers Overall transfer level: Needs assistance Equipment used: 2 person hand held assist Transfers: Sit to/from [facility] Sit to Stand: +2 physical assistance;Mod assist Stand pivot transfers: +2 physical assistance;Mod assist       General transfer comment: Assist to bring hips up and for balance. Pt with shuffling pivotal steps from chair to bed. Pt with forward head and flexed posture throughout. Attempted to stand from bedside with 1 person and pt unable   Ambulation/Gait                 Stairs             Wheelchair Mobility    Modified Rankin (Stroke Patients Only)       Balance Overall balance assessment: Needs assistance;History of Falls Sitting-balance support: No upper extremity supported;Feet supported Sitting balance-Leahy Scale: Fair Sitting balance - Comments: flexed posture with forward head     Standing balance-Leahy Scale: Zero Standing balance comment: +2 mod assist for balance                            Cognition Arousal/Alertness: Awake/alert Behavior During Therapy: Flat affect Overall Cognitive Status: Impaired/Different from baseline Area of Impairment: Orientation;Memory;Attention;Following commands;Safety/judgement;Problem solving;Awareness                 Orientation Level: Situation;Time Current Attention Level: Focused Memory: Decreased short-term memory Following Commands: Follows one step commands inconsistently Safety/Judgement: Decreased awareness of safety;Decreased awareness of deficits Awareness: Intellectual Problem Solving: Slow processing;Difficulty sequencing;Requires verbal cues;Requires tactile cues;Decreased initiation        Exercises      General Comments        Pertinent Vitals/Pain Pain Assessment: Faces Faces Pain Scale: No hurt    Home Living                      Prior Function            PT Goals (current goals can now be found in the care plan section) Progress towards PT goals: Not progressing toward goals -  comment    Frequency    Min 2X/week      PT Plan Current plan remains appropriate    Co-evaluation              AM-PAC PT "6 Clicks" Mobility   Outcome Measure  Help needed turning from your back to your side while in a flat bed without using bedrails?: A Little Help needed moving from lying on your back to sitting on the side of a flat bed without using bedrails?: A Lot Help needed moving to and from a bed to a chair (including a wheelchair)?: A  Lot Help needed standing up from a chair using your arms (e.g., wheelchair or bedside chair)?: A Lot Help needed to walk in hospital room?: Total Help needed climbing 3-5 steps with a railing? : Total 6 Click Score: 11    End of Session Equipment Utilized During Treatment: Oxygen Activity Tolerance: Patient limited by fatigue Patient left: in bed;with call bell/phone within reach;with bed alarm set Nurse Communication: Mobility status PT Visit Diagnosis: Difficulty in walking, not elsewhere classified (R26.2);Muscle weakness (generalized) (M62.81)     Time: 1216-1227 PT Time Calculation (min) (ACUTE ONLY): 11 min  Charges:  $Therapeutic Activity: 8-22 mins                     [place] Pager [phone] Office [place] 01/27/2018, 3:08 PM

## 2018-01-27 NOTE — Consult Note (Signed)
NAME:  Cody Lane, MRN:  [id number], DOB:  1940/04/14, LOS: 7 ADMISSION DATE:  01/20/2018, CONSULTATION DATE:  1/16 REFERRING MD:  Dr. Joni Reining IMTS, CHIEF COMPLAINT:  Hypoxia   Brief History   78 year old male with recent diagnosis of lung cancer admitted for hypoxia.   History of present illness   78 year old male with PMH as below, which is significant for COPD, HFrEF, Atrial fibrillation with recent CVA, and Recent diagnosis of NSCLC. He has been set up for chemotherapy and radiation, however, he has not received either of these yet due to poor health. Per wife, he has never fully recovered from his stroke and had been growing progressively weaker over a period of months with very poor appetite. He has recently been admitted for respiratory failure prompting treatment for CHF exacerbation and pneumonia. He was discharged in the early part of January newly on home O2, but developed left sided chest pain, which caused him to present to [facility] on 1/9.   He was admitted to IMTS and treated for CHF with diuresis. He underwent thoracentesis on 1/15 and had one liter of exudative fluid removed most consistent with metastatic effusion. Despite these therapies, he remains very weak and hypoxic on 5L O2. PCCM has been asked to see in evaluation.   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  Significant Hospital Events   1/11 admit 1/15 thora 1/16 pulm consult  Consults:  Palliative care Rad onc PCCM  Procedures:  Thoracentesis 1/15 > lymphocytic exudate.  Cytology 1/15 >  Significant Diagnostic Tests:  10/6 CT chest: Central left lower lobe 4.5 cm lung mass with associated mild extrinsic narrowing of the distal left mainstem bronchus and proximal left upper and left lower lobe airways. Several other nodule and LAN.  12/13 CTA chest: Progression of left hilar mass since 10/17/2017, with interval occlusion of the inferior left pulmonary vein, and narrowing of distal distal left bronchus and lobar branches. Progressive mediastinal adenopathy and bilateral pulmonary nodules. 1/14 CT head: Atrophy with small vessel chronic ischemic changes of deep cerebral white matter. Age-indeterminate lacunar infarct RIGHT basal ganglia, new since 10/15/2017. Old LEFT thalamic lacunar infarct. No intracranial hemorrhage identified.  Micro Data:  Pleural 1/15 >  Antimicrobials:  Cefdinir 1/9 > 1/10  Interim history/subjective:  Feels as though he is breathing better. Cough productive for brown sputum. Denies fever/chills.   Objective   Blood pressure 90/74, pulse (!) 130, temperature 98.4 F (36.9 C), temperature source Oral, resp. rate (!) 24, height 6\' 1"  (1.854 m), weight 73.5 kg, SpO2 94 %.        Intake/Output Summary (Last 24 hours) at 01/27/2018 1155 Last data filed at 01/26/2018 1600 Gross per 24 hour  Intake 480 ml  Output -  Net 480 ml   Filed Weights   01/25/18 0510 01/26/18 0300 01/27/18 0300  Weight: 72.2 kg 73.3 kg 73.5 kg    Examination: General: Frail elderly male in NAD in bedside chair.  HENT: [place]/AT, PERRL, no appreciable JVD  Lungs: Diminished bases, shallow/poor effort. Upper airway sounds.  Cardiovascular: IRIR tachy Abdomen: Soft, non-tender, non-distended Extremities: No acute deformity Neuro: Awake, alert, limited  interaction. Will answer simple questions appropriately.  GU: Foley  Resolved Hospital Problem list     Assessment & Plan:   Acute hypoxemic respiratory failure: Likely multifactorial initially considering his history of COPD, HFrEF, pleural effusion, and pneumonia. In reviewing his chart, it appears as though he has received adequate courses of antibiotics and diuresis as well as a thoracentesis. He has improved some, but remains hypoxemic and deconditioned. At this point it is increasingly likely that residual symptoms are due to progression of his known stage 3 NSCLC. Pleural fluid is a lymphocytic exudate concerning for metastasis (cytology pending). On imaging over one month ago be had a near obstruction of is LLL bronchus. This has likely worsened.    Plan: - Supplemental O2 to keep O2 sats > 94% (currently on 4L) - Await cytology - Have discussed with radiation oncology who will see the patient here, as transfer to [facility] is arranged. The patient does not seem strong enough to tolerate chemotherapy, but may be OK for radiation, even if only palliative.  - Agree with palliative care involvement. - Incentive spirometry.   COPD without acute exacerbation - Will change PRN albuterol nebs to levalbuterol considering AF with RVR.   - Please ensure he is using Incruse with spacer, or change to nebulized medication  Best practice:  Diet: Per primary Pain/Anxiety/Delirium protocol (if indicated): na VAP protocol (if indicated): na DVT prophylaxis: eliquis for AF GI prophylaxis: na Glucose control: na Mobility: with assist Code Status: Full code Family Communication: Wife and patient updated bedside Disposition: Transfer to [facility]  Labs   CBC: Recent Labs  Lab 01/23/18 0205 01/24/18 0242 01/25/18 0651 01/26/18 0252 01/27/18 0300  WBC 11.8* 12.7* 11.5* 11.3* 10.2  NEUTROABS 8.9*  --  9.1* 8.3* 7.4  HGB 11.6* 11.7* 12.2* 12.6* 11.6*  HCT 38.6* 38.7* 40.8 42.5 38.8*  MCV  95.5 96.5 96.7 97.3 97.2  PLT 153 145* 163 143* 135*    Basic Metabolic Panel: Recent Labs  Lab 01/23/18 0205 01/24/18 0242 01/25/18 0257 01/26/18 0252 01/27/18 0300  NA 140 139 139 142 140  K 3.9 4.4 4.2 4.6 4.6  CL 98 97* 96* 96* 96*  CO2 34* 34* 35* 32 36*  GLUCOSE 113* 112* 99 90 101*  BUN 24* 25* 27* 34* 40*  CREATININE 1.48* 1.36* 1.44* 1.64* 1.73*  CALCIUM 8.5* 8.4* 8.8* 9.1 8.9   GFR: Estimated Creatinine Clearance: 37.2 mL/min (A) (by C-G formula based on SCr of 1.73 mg/dL (H)). Recent Labs  Lab 01/24/18 0242 01/25/18 0651 01/26/18 0252 01/27/18 0300  WBC 12.7* 11.5* 11.3* 10.2    Liver Function Tests: Recent Labs  Lab 01/25/18 0941  PROT 6.8   No results for input(s): LIPASE, AMYLASE in the last 168 hours. No results for input(s): AMMONIA in the last 168 hours.  ABG    Component Value Date/Time   HCO3 37.9 (H) 01/25/2018 1920   O2SAT 87.5 01/25/2018 1920     Coagulation Profile: No results for input(s): INR, PROTIME in the last 168 hours.  Cardiac Enzymes: Recent Labs  Lab 01/20/18 1626 01/20/18 2243  TROPONINI 0.04* 0.03*    HbA1C: Hgb A1c MFr Bld  Date/Time Value Ref Range Status  10/16/2017 07:21 AM 5.2 4.8 - 5.6 % Final    Comment:    (NOTE)  Prediabetes: 5.7 - 6.4         Diabetes: >6.4         Glycemic control for adults with diabetes: <7.0     CBG: No results for input(s): GLUCAP in the last 168 hours.  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   Surgical History    Past Surgical History:  Procedure Laterality Date  . AORTIC VALVE REPLACEMENT    . CARDIAC VALVE REPLACEMENT    . CATARACT EXTRACTION W/ INTRAOCULAR LENS  IMPLANT, BILATERAL    . CORONARY ANGIOPLASTY WITH STENT PLACEMENT     "I have 2 stents" (01/20/2018)  . INGUINAL HERNIA REPAIR Left   . IR THORACENTESIS ASP PLEURAL SPACE W/IMG GUIDE  01/26/2018     Social History   reports that he quit smoking about 13 years ago. His smoking use included cigarettes. He quit after 45.00 years of use. He has never used smokeless tobacco. He reports current alcohol use of about 8.0 standard drinks of alcohol per week. He reports that he does not use drugs.   Family History   His family history includes Stroke in his father.   Allergies No Known Allergies   Home Medications  Prior to Admission medications   Medication Sig Start Date End Date Taking? Authorizing Provider  albuterol (PROVENTIL HFA;VENTOLIN HFA) 108 (90 BASE) MCG/ACT inhaler Inhale 2 puffs into the lungs every 6 (six) hours as needed for wheezing or shortness of breath.   Yes [provider]  albuterol (PROVENTIL) (2.5 MG/3ML) 0.083% nebulizer solution Take 2.5 mg by nebulization every 6 (six) hours as needed for wheezing or shortness of breath.   Yes [provider]  amLODipine (NORVASC) 10 MG tablet Take 10 mg by mouth daily.   Yes [provider]  apixaban (ELIQUIS) 5 MG TABS tablet Take 1 tablet (5 mg total) by mouth 2 (two) times daily. 10/18/17  Yes Gherghe, Vella Redhead, MD  atorvastatin (LIPITOR) 40 MG tablet Take 40 mg by mouth daily.   Yes [provider]  diltiazem (CARDIZEM) 60 MG tablet Take 60 mg by mouth 2 (two) times daily. 01/17/18  Yes [provider]    furosemide (LASIX) 40 MG tablet Take 40 mg by mouth daily.    Yes [provider]  gabapentin (NEURONTIN) 400 MG capsule Take 400 mg by mouth 2 (two) times daily.    Yes [provider]  lisinopril (PRINIVIL,ZESTRIL) 2.5 MG tablet Take 2.5 mg by mouth every morning. 01/18/18  Yes [provider]  loratadine (CLARITIN) 10 MG tablet Take 10 mg by mouth daily.   Yes [provider]  metoprolol tartrate (LOPRESSOR) 25 MG tablet Take 37.5 mg by mouth 2 (two) times daily.    Yes [provider]  Multiple Vitamins-Minerals (MULTIVITAMIN WITH MINERALS) tablet Take 1 tablet by mouth daily.   Yes [provider]  ondansetron (ZOFRAN-ODT) 4 MG disintegrating tablet Take 4 mg by mouth every 8 (eight) hours as needed for  nausea or vomiting.   Yes [provider]  tiotropium (SPIRIVA) 18 MCG inhalation capsule Place 1 capsule into inhaler and inhale 2 (two) times daily.    Yes [provider]     Georgann Housekeeper, AGACNP-BC [place] Pager [phone] or [phone]  01/27/2018 12:32 PM

## 2018-01-27 NOTE — Progress Notes (Signed)
  MRI  Results called in-  Small acute /early subacute  Infarction  Within the bilateral , posterior, frontal  Lobe. Centrumsemiovale , no hemorrhage or mass effect  Scattered foci of infarction  Distribution favors water shed infarction.

## 2018-01-28 ENCOUNTER — Inpatient Hospital Stay: Payer: Self-pay

## 2018-01-28 ENCOUNTER — Ambulatory Visit
Admit: 2018-01-28 | Discharge: 2018-01-28 | Disposition: A | Discharge status: Home / Self Care | Payer: Medicare Other | Attending: Radiation Oncology | Admitting: Radiation Oncology

## 2018-01-28 DIAGNOSIS — C3432 Malignant neoplasm of lower lobe, left bronchus or lung: Secondary | ICD-10-CM

## 2018-01-28 LAB — BASIC METABOLIC PANEL
Anion gap: 11 (ref 5–15)
BUN: 53 mg/dL — ABNORMAL HIGH (ref 8–23)
CALCIUM: 9 mg/dL (ref 8.9–10.3)
CO2: 36 mmol/L — ABNORMAL HIGH (ref 22–32)
Chloride: 96 mmol/L — ABNORMAL LOW (ref 98–111)
Creatinine, Ser: 1.64 mg/dL — ABNORMAL HIGH (ref 0.61–1.24)
GFR calc Af Amer: 46 mL/min — ABNORMAL LOW (ref >60)
GFR, EST NON AFRICAN AMERICAN: 40 mL/min — AB (ref >60)
Glucose, Bld: 113 mg/dL — ABNORMAL HIGH (ref 70–99)
Potassium: 4.3 mmol/L (ref 3.5–5.1)
Sodium: 143 mmol/L (ref 135–145)

## 2018-01-28 MED ORDER — METOPROLOL TARTRATE 50 MG PO TABS
50.0000 mg | ORAL_TABLET | Freq: Three times a day (TID) | ORAL | Status: DC
  Administered 2018-01-28 – 2018-02-01 (×10): 50 mg via ORAL
  Filled 2018-01-28 (×15): qty 1

## 2018-01-28 MED ORDER — METOPROLOL TARTRATE 5 MG/5ML IV SOLN
15.0000 mg | Freq: Four times a day (QID) | INTRAVENOUS | Status: DC
  Administered 2018-01-28 (×2): 15 mg via INTRAVENOUS
  Filled 2018-01-28 (×2): qty 15

## 2018-01-28 MED ORDER — METOPROLOL TARTRATE 5 MG/5ML IV SOLN: 15.0000 mg | Freq: Four times a day (QID) | INTRAVENOUS | Status: DC

## 2018-01-28 NOTE — Progress Notes (Signed)
Subjective: [name] is more sleepy this morning.  He does not have any acute complaints.  His wife [facility] Lane is in the room and states that she does feel as though his mental status has improved since the thoracentesis.  She is aware that he will be transferred to [facility] for palliative radiation.  All questions and concerns were addressed this morning.  Lane Course: [name] is a 78 year old male with a history of emphysematous COPD, systolic CHF, atrial fibrillation, recent CVA who is admitted for acute hypoxic respiratory failure.  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  He was in the process of initiating chemotherapy through the [facility] system but this was deferred because he became hypoxemic and was hospitalized on these occasions.  He was recently discharged and readmitted to [facility] with acute hypoxic respiratory failure secondary to acute on chronic systolic CHF, known lung cancer, chronic COPD, and community-acquired pneumonia.  He completed a full course of antibiotics for CAP and has been diuresed for pulmonary edema.  He underwent a thoracentesis with 900 cc of fluid removed.  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  Despite removal of fluid he continues to have increased supplemental oxygen requirements.  Pulmonology and radiation oncology both consulted and recommended transfer to [facility] for palliative radiation. He will undergo stimulation here prior to transfer.  His Lane course has been complicated by by hypotension, persistent atrial fibrillation with RVR, intermittent confusion, difficulty swallowing, and subacute stroke (please see significant events below).   Significant Events: 01/25/2018 Head CT: Atrophy with small vessel chronic ischemic changes of deep  cerebral white matter. Age-indeterminate lacunar infarct RIGHT basal ganglia, Cody since 10/15/2017. Old LEFT thalamic lacunar infarct. No intracranial hemorrhage identified. 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 01/26/2018 CXR:  1. No definite residual left pleural effusion status post thoracentesis. No pneumothorax. 2. Unchanged interstitial pulmonary edema and left lower lobe opacity which could reflect alveolar edema or pneumonia. 01/27/2018 MRI Brain: 1. Small acute/early subacute infarctions within the bilateral posterior frontal lobe centrum semiovale. No hemorrhage or mass effect. Few probable additional scattered punctate foci of infarction, partially obscured by motion artifact. Distribution favors watershed infarction. 2. Stable moderate to severe chronic microvascular ischemic changes and moderate volume loss of the brain. 3. Stable foci of chronic microhemorrhage, likely related to chronic hypertension. 4. No intracranial metastasis identified.  Consultants:  01/21/2018 Palliative Medicine: Family and patient would like full scope of care including radiation and chemotherapy. 01/27/2018 Pulmonology 01/27/2018 Radiation Oncology  Objective:  Vital signs in last 24 hours: Vitals:   01/27/18 2355 01/28/18 0347 01/28/18 0500 01/28/18 0749  BP: 111/89 93/69  122/89  Pulse: 95 (!) 105 90 77  Resp: 20 18  (!) 25  Temp: 99.2 F (37.3 C) 98.8 F (37.1 C)  97.9 F (36.6 C)  TempSrc: Oral Oral  Oral  SpO2: 94% 97%  (!) 80%  Weight:   71.4 kg   Height:       General: Lying in bed in no acute distress Cardiovascular: Irregular rhythm and tachycardia Respiratory: Auscultated anteriorly, diminished lung sounds at the lung bases, saturating above 94% on 3 L supplemental oxygen Musculoskeletal: No lower extremity edema appreciated, no tenderness to palpation of the lower  extremities.  Assessment/Plan:  Principal Problem:   Acute on chronic systolic heart failure (HCC) Active Problems:   Essential hypertension   Non-small cell carcinoma of lung (HCC)   COPD (chronic obstructive pulmonary disease) with emphysema (HCC)   Chronic respiratory failure with hypoxia, on home O2 therapy (HCC)   Physical deconditioning   CAP (community acquired pneumonia)   Malnutrition of moderate degree   Goals of care, counseling/discussion   Palliative care by specialist   Paroxysmal atrial fibrillation ([place])   Pleural effusion   S/P thoracentesis  [facility] is admitted for acute hypoxic respiratory failure secondary to acute on chronic systolic CHF, non-small cell lung cancer, and chronic COPD. He will be transferred to [facility] for palliative radiation.  Acute hypoxic respiratory failure:Continues to remain on a large amount of supplemental oxygen this morning.  Pulmonology and radiation oncology consulted and recommended palliative radiation to decrease his lung cancer with possible improvement in his respiratory status.  He will undergo stimulation today and be transferred to [facility].  Paroxysmal atrial fibrillation with TLX:BWIOM rate remains elevated.  There is concern that he cannot take p.o. medications we will switch his metoprolol to IV today. - Start metoprolol IV 15 mg every 6 hours - Continue Eliquis--he is consistently taking this medication although it is p.o.  We will continue this for now and switch to IV if needed.  BTD:HRCBULAGTX stable at 1.64.  We will continue to monitor  Hypertension:Continue to have low blood pressures.  Holding all blood pressure medications except for metoprolol at this time.  Non-small cell lung cancer:  Will be transferred to [facility] for palliative radiation.   Dispo: Anticipated discharge today.  Carroll Sage, MD 01/28/2018, 9:33 AM Pager: [phone]

## 2018-01-28 NOTE — Care Management Important Message (Signed)
Important Message  Patient Details  Name: [facility] MRN: [id number] Date of Birth: March 09, 1940   Medicare Important Message Given:  Yes    Rosanne Wohlfarth P [place] 01/28/2018, 4:40 PM

## 2018-01-28 NOTE — Progress Notes (Signed)
Spoke with Burnett Sheng at [facility] to let her know of the patients upcoming radiation simulation appointment scheduled for today at 10:00 am.  I let her know that she needs to call [facility] to set up transportation to the [facility] to have the patient here at 9:45 am.  Will continue to follow as necessary.  Gloriajean Dell. Leonie Green, BSN

## 2018-01-28 NOTE — Progress Notes (Signed)
  Speech Language Pathology Treatment: Dysphagia  Patient Details Name: [facility] MRN: [id number] DOB: 05/26/1940 Today's Date: 01/28/2018 Time: 9357-0177 SLP Time Calculation (min) (ACUTE ONLY): 24 min  Assessment / Plan / Recommendation Clinical Impression  Pt more receptive to try food/liquid today with encouragement today. Continued to intermittently hold liquid and puree boluses in oral cavity with suspicion of spill to pharynx, possible airway intrusion noted by throat clearing as pt holding bolus. Swallows initiated more timely did not result in s/s aspiration. His confusion impacts efficiency of oral phase. Pt is at higher risk of aspiration (silent) given COPD, recent lung cancer dx, cognitive status. Will initiate regular texture diet to allow more choices although educated wife to order softer textures ( mashed potatoes, pudding, yogurt, cream soups etc.) until MBS can be performed to fully assess swallow.    HPI HPI: 78 year old male admitted 01/20/2018 PMH: non-small cell lung cancer, COPD, emphysema, AFib, AVR, aneurysm, CHF, HTN, A&MVRegurg, HLD, recurrent PNA. Recent hospitalization for hypoxia and PNA amd acute metabolic encephalopathy. CXR = interstitial pulmonary edema and left lower lobe opacity which could reflect alveolar edema or pneumonia. SLE 10/16/17 =       SLP Plan  MBS       Recommendations  Diet recommendations: Regular;Thin liquid Liquids provided via: Straw;Cup Medication Administration: Crushed with puree Supervision: Patient able to self feed;Full supervision/cueing for compensatory strategies Compensations: Minimize environmental distractions;Slow rate;Small sips/bites;Lingual sweep for clearance of pocketing Postural Changes and/or Swallow Maneuvers: Seated upright 90 degrees                Oral Care Recommendations: Oral care BID Follow up Recommendations: 24 hour supervision/assistance;Skilled Nursing facility SLP Visit Diagnosis: Dysphagia,  unspecified (R13.10) Plan: MBS       GO                Houston Siren 01/28/2018, 5:02 PM  Orbie Pyo Tytiana Coles M.Ed Risk analyst [phone] Office [phone]

## 2018-01-28 NOTE — Consult Note (Signed)
Radiation Oncology         [phone] ________________________________  Name: Cody Lane        MRN: [id number]  Date of Service: 01/27/2018 DOB: September 23, 1940  TD:HRCBUL, Cody Lane  No ref. provider found     REFERRING PHYSICIAN: No ref. provider found   DIAGNOSIS: The primary encounter diagnosis was Nonspecific chest pain. Diagnoses of SOB (shortness of breath), Dyspnea, Pleural effusion, and S/P thoracentesis were also pertinent to this visit.   HISTORY OF PRESENT ILLNESS: Cody Lane is a 78 y.o. male seen at the request of Dr. Lamonte Sakai for a recently diagnosed stage IV lung cancer. The patient's history is complicated by multiple medical admissions and ED visits. He began having symptoms of dizziness and [facility] and during a work up for his dizziness an MRI of the brain was negative. He did have CTA of the neck that showed left proximal internal carotid artery dissection versus thrombus. He did not require vascular surgery repair. He presented on 10/15/17 with similar symptoms and headache with ptosis of hi slef eye. CT angiogram revealed supraclavicular adenopathy, no dissection was noted, and he was diagnosed with new a fib. He had been diagnosed with recent infarct. At that time he had additional lung work up that showed a mass in the LLL, and  multiple pulmonary nodules and extensive adenopathy. The patient declined biopsy then and wanted to follow up with the [place]. PET imaging on 11/18/2018 coordinated by Dr. Halford Chessman at his post hospital follow up revealed a 4.7 x 3.4 cm LLL retro hilar mass with an SUV of 15.2. Bilateral supraclavicular, prevascular, right paratracheal, left paratracheal, paraesophageal, subcarinal, AP window, and right hilar adenopathy was noted. He has severe emphysema. 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 He has met with medical oncology at the [facility] and has decided to proceed with treatment in [facility], however his oncology care has been delayed due to multiple hospitalizations as a result of hypoxemia, pneumonia and confusion. He is currently hospitalized and his wife tells me he's only been out of the hospital 2 days in the last month. He presented this time on 01/20/2018 to [facility] with chest pain, was found to be in A Fib, and chronic respiratory failure with hypoxia, and acute on chronic CHF. He has had continued confusion as well. We've been asked to see him to initiate palliative radiotherapy to the chest. His last dedicated chest imaging was in December with CT. At that time there had be progression of the left hilar mass with interval occlusion of the inferiori left pulmonary vein and narrowing distal distal left bronchus and lobar branches. He had progressive mediastinal adenopathy as well. He has multiple aneurysms of the thoracic aorta, and bilateral renal and iliac arteries. He is seen today to consider palliative treatment to the chest. Of note, he has been between 90-97% with his O2. He's been taking off his O2 and his readings are hard to gauge, but he's supposed to have been on 5L [place] per nursing.     PREVIOUS RADIATION THERAPY: No   PAST MEDICAL HISTORY:  Past Medical History:  Diagnosis Date  . Aortic valve regurgitation 01/26/14  . Atrial fibrillation ([place])   . 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 CHF (congestive heart failure) ([place]) 01/26/2014  . COPD (chronic obstructive pulmonary disease) ([place])   .  CVA (cerebral vascular accident) ([place]) 10/2017   "left side still weak" (01/20/2018)  . Emphysema (subcutaneous) (surgical) resulting from a procedure   . Heart murmur   . Hypertension   . 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 Lung cancer ([place]) dx'd 12/2017  . Lung mass   . Mitral valve regurgitation 01/26/2014  . Myocardial infarction ([place])    "I've had 2; last 1 was ~ 1 wk ago" (01/20/2018)    . On home oxygen therapy    "2L; 24/7" (01/20/2018)  . Pneumonia    "several times" (01/20/2018)  . Pseudophakia 08/17/2013  . 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 Thoracic aortic aneurysm ([place])   . Vitamin D deficiency        PAST SURGICAL HISTORY: Past Surgical History:  Procedure Laterality Date  . AORTIC VALVE REPLACEMENT    . CARDIAC VALVE REPLACEMENT    . CATARACT EXTRACTION W/ INTRAOCULAR LENS  IMPLANT, BILATERAL    . CORONARY ANGIOPLASTY WITH STENT PLACEMENT     "I have 2 stents" (01/20/2018)  . INGUINAL HERNIA REPAIR Left   . IR THORACENTESIS ASP PLEURAL SPACE W/IMG GUIDE  01/26/2018     FAMILY HISTORY:  Family History  Problem Relation Age of Onset  . Stroke Father      SOCIAL HISTORY:  reports that he quit smoking about 13 years ago. His smoking use included cigarettes. He quit after 45.00 years of use. He has never used smokeless tobacco. He reports current alcohol use of about 8.0 standard drinks of alcohol per week. He reports that he does not use drugs.   ALLERGIES: Patient has no known allergies.   MEDICATIONS:  Current Facility-Administered Medications  Medication Dose Route Frequency Provider Last Rate Last Dose  . acetaminophen (TYLENOL) tablet 650 mg  650 mg Oral Q6H PRN Kalman Shan Ratliff, DO       Or  . acetaminophen (TYLENOL) suppository 650 mg  650 mg Rectal Q6H PRN Hoffman, Jessica Ratliff, DO      . AEROCHAMBER PLUS FLO-VU MEDIUM MISC 1 each  1 each Other Once Corey Harold, NP      . apixaban Arne Cleveland) tablet 5 mg  5 mg Oral BID Kalman Shan Ratliff, DO   5 mg at 01/27/18 2112  . atorvastatin (LIPITOR) tablet 40 mg  40 mg Oral Daily Kalman Shan Ratliff, DO   40 mg at 01/27/18 1008  . feeding supplement (ENSURE ENLIVE) (ENSURE ENLIVE) liquid 237 mL  237 mL Oral BID BM Hoffman, Erik C, DO   237 mL at 01/27/18 1731  . feeding supplement (PRO-STAT SUGAR FREE 64) liquid 30 mL  30 mL Oral TID Joni Reining C, DO   30 mL at 01/27/18 1731  .  gabapentin (NEURONTIN) capsule 400 mg  400 mg Oral BID Kalman Shan Ratliff, DO   400 mg at 01/27/18 2112  . levalbuterol (XOPENEX) nebulizer solution 0.63 mg  0.63 mg Nebulization Q3H PRN Corey Harold, NP      . loratadine (CLARITIN) tablet 10 mg  10 mg Oral Daily Kalman Shan Ratliff, DO   10 mg at 01/27/18 1008  . MEDLINE mouth rinse  15 mL Mouth Rinse BID Lucious Groves, DO   15 mL at 01/27/18 2124  . metoprolol tartrate (LOPRESSOR) tablet 50 mg  50 mg Oral TID Welford Roche, MD   50 mg at 01/27/18 2112  . multivitamin with minerals tablet 1 tablet  1 tablet Oral Daily Lucious Groves, DO  1 tablet at 01/27/18 1009  . ondansetron (ZOFRAN) injection 4 mg  4 mg Intravenous Q8H PRN Carroll Sage, MD   4 mg at 01/26/18 1037  . senna-docusate (Senokot-S) tablet 1 tablet  1 tablet Oral QHS PRN Hoffman, Jessica Ratliff, DO      . umeclidinium bromide (INCRUSE ELLIPTA) 62.5 MCG/INH 1 puff  1 puff Inhalation Daily Joni Reining C, DO   1 puff at 01/27/18 0349     REVIEW OF SYSTEMS: On review of systems, the patient is unable to give a reivew of systems due to his confusion. He is able to tell me he is in the hospital. He coughs during our discussion and has thick productive white mucous. His wife reports he has chronic cough, and is short of breath with any exertion. He's lost more than 50 pounds in the last 6 months. He has had fevers during times of pneumonia. He has not verbalized any other concerns, but had been very dizzi previously when he was able to communicate his symptoms. .    PHYSICAL EXAM:  Wt Readings from Last 3 Encounters:  01/27/18 162 lb 0.6 oz (73.5 kg)  12/15/17 173 lb 12.8 oz (78.8 kg)  12/06/17 174 lb (78.9 kg)   Temp Readings from Last 3 Encounters:  01/28/18 98.8 F (37.1 C) (Oral)  12/02/17 98.7 F (37.1 C) (Oral)  10/18/17 98.3 F (36.8 C) (Oral)   BP Readings from Last 3 Encounters:  01/28/18 93/69  12/15/17 118/72  12/06/17 104/80    Pulse Readings from Last 3 Encounters:  01/28/18 (!) 105  12/15/17 79  12/06/17 68   Pain Assessment Pain Score: Asleep/10  In general this is a chronically ill appearing African American male who is alert and oriented to place and person.  HEENT reveals that the patient is normocephalic, atraumatic. EOMs are intact.  Skin is intact without any evidence of gross lesions. Cardiovascular exam reveals an irregular rate and rhythm, no clicks rubs or murmurs are auscultated. Chest reveals wheezing audible without auscultation of the chest. Lymphatic assessment is performed and reveals fullness in the supraclavicular chains. The abdomen is soft, non tender, non distended.    ECOG = 4  0 - Asymptomatic (Fully active, able to carry on all predisease activities without restriction)  1 - Symptomatic but completely ambulatory (Restricted in physically strenuous activity but ambulatory and able to carry out work of a light or sedentary nature. For example, light housework, office work)  2 - Symptomatic, <50% in bed during the day (Ambulatory and capable of all self care but unable to carry out any work activities. Up and about more than 50% of waking hours)  3 - Symptomatic, >50% in bed, but not bedbound (Capable of only limited self-care, confined to bed or chair 50% or more of waking hours)  4 - Bedbound (Completely disabled. Cannot carry on any self-care. Totally confined to bed or chair)  5 - Death   Eustace Pen MM, Creech RH, Tormey DC, et al. [phone]). "Toxicity and response criteria of the [facility]". [place] Oncol. 5 (6): 649-55    LABORATORY DATA:  Lab Results  Component Value Date   WBC 10.2 01/27/2018   HGB 11.6 (L) 01/27/2018   HCT 38.8 (L) 01/27/2018   MCV 97.2 01/27/2018   PLT 135 (L) 01/27/2018   Lab Results  Component Value Date   NA 143 01/28/2018   K 4.3 01/28/2018   CL 96 (L) 01/28/2018   CO2  36 (H) 01/28/2018   No results found for: ALT,  AST, GGT, ALKPHOS, BILITOT    RADIOGRAPHY: Dg Chest 2 View  Result Date: 01/20/2018 CLINICAL DATA:  Left chest pain. EXAM: CHEST - 2 VIEW COMPARISON:  12/24/2017 FINDINGS: Previous median sternotomy CABG procedure. Aortic atherosclerosis. Small bilateral pleural effusions and mild diffuse interstitial edema identified. Asymmetric opacification of the left lower lobe is identified which may reflect asymmetric edema, pneumonia or postobstructive pneumonitis secondary to known central hilar mass. IMPRESSION: 1. Cardiac enlargement and CHF. 2. Asymmetric opacification of the left lower lobe which may represent asymmetric edema, pneumonia versus progressive postobstructive pneumonitis secondary to known central left hilar lung mass. Electronically Signed   By: Kerby Moors M.D.   On: 01/20/2018 09:01   Ct Head Wo Contrast  Result Date: 01/25/2018 CLINICAL DATA:  Mental status changes, speech difficulty, history lung cancer, CHF, AVR, stroke, atrial fibrillation, COPD, hypertension, coronary artery disease post MI EXAM: CT HEAD WITHOUT CONTRAST TECHNIQUE: Contiguous axial images were obtained from the base of the skull through the vertex without intravenous contrast. Sagittal and coronal MPR images reconstructed from axial data set. COMPARISON:  10/15/2017 FINDINGS: Brain: Generalized atrophy. Normal ventricular morphology. No midline shift or mass effect. Small vessel chronic ischemic changes of deep cerebral white matter. Lacunar infarct RIGHT basal ganglia age indeterminate but new since prior exam. Old lacunar infarct LEFT thalamus. No intracranial hemorrhage or focal mass lesion. No extra-axial fluid collections. Vascular: Unremarkable Skull: Intact Sinuses/Orbits: Clear Other: N/A IMPRESSION: Atrophy with small vessel chronic ischemic changes of deep cerebral white matter. Age-indeterminate lacunar infarct RIGHT basal ganglia, new since 10/15/2017. Old LEFT thalamic lacunar infarct. No intracranial  hemorrhage identified. Electronically Signed   By: Lavonia Dana M.D.   On: 01/25/2018 18:47   Mr Jeri Cos LN Contrast  Result Date: 01/27/2018 CLINICAL DATA:  79 y/o M; mental status changes, speech difficulty, history of lung cancer. EXAM: MRI HEAD WITHOUT AND WITH CONTRAST TECHNIQUE: Multiplanar, multiecho pulse sequences of the brain and surrounding structures were obtained without and with intravenous contrast. CONTRAST:  7 cc Gadavist COMPARISON:  01/25/2018 CT head.  10/28/2017 MRI head. FINDINGS: Brain: Motion degraded study. 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 Small chronic lacunar infarcts are present within the left thalamus and bilateral caudate heads. Small chronic infarct of the left anterior corona radiata. Stable confluent nonspecific T2 FLAIR hyperintensities in subcortical and periventricular white matter are compatible with moderate to severe chronic microvascular ischemic changes for age. Moderate volume loss of the brain. Numerous stable scattered foci of susceptibility hypointensity compatible with chronic microhemorrhage, central predominance, likely related to chronic hypertension. No abnormal enhancement of the brain. Small linear foci of reduced diffusion are present within the posterior frontal centrum semiovale bilaterally (series 8, image 16) compatible with acute/early subacute infarctions. A few additional punctate foci in white matter may be present bilaterally. Vascular: Normal flow voids. Skull and upper cervical spine: Normal marrow signal. Sinuses/Orbits: Negative. Other: Bilateral intra-ocular lens replacement. IMPRESSION: 1. Small acute/early subacute infarctions within the bilateral posterior frontal lobe centrum semiovale. No hemorrhage or mass effect. Few probable additional scattered punctate foci of infarction, partially obscured by motion artifact. Distribution favors watershed infarction. 2. Stable moderate to severe chronic  microvascular ischemic changes and moderate volume loss of the brain. 3. Stable foci of chronic microhemorrhage, likely related to chronic hypertension. 4. No intracranial metastasis identified. These results will be called to the ordering clinician or representative by the Radiologist Assistant, and communication  documented in the PACS or zVision Dashboard. Electronically Signed   By: Kristine Garbe M.D.   On: 01/27/2018 16:09   Dg Chest Port 1 View  Result Date: 01/26/2018 CLINICAL DATA:  .  Left thoracentesis. EXAM: PORTABLE CHEST 1 VIEW COMPARISON:  Chest x-ray dated January 20, 2018. FINDINGS: Stable cardiomegaly status post CABG. Mild diffuse interstitial edema is similar to prior study. Unchanged small right pleural effusion. No definite residual left pleural effusion status post thoracentesis. Patchy increased density in the left lower lobe is unchanged. No pneumothorax. No acute osseous abnormality. IMPRESSION: 1. No definite residual left pleural effusion status post thoracentesis. No pneumothorax. 2. Unchanged interstitial pulmonary edema and left lower lobe opacity which could reflect alveolar edema or pneumonia. Electronically Signed   By: Titus Dubin M.D.   On: 01/26/2018 13:34   Ir Thoracentesis Asp Pleural Space W/img Guide  Result Date: 01/26/2018 INDICATION: Patient with history of paroxysmal a.fib with RVR, AKI, HTN, acute hypoxic respiratory failure - request for diagnostic and therapeutic thoracentesis today at bedside. EXAM: ULTRASOUND GUIDED LEFT THORACENTESIS MEDICATIONS: 10 mL 1% lidocaine COMPLICATIONS: None immediate. PROCEDURE: An ultrasound guided thoracentesis was thoroughly discussed with the patient's wife and questions answered. The benefits, risks, alternatives and complications were also discussed. The patient's wife understands and wishes to proceed with the procedure. Written consent was obtained. Ultrasound was performed to localize and mark an adequate  pocket of fluid in the left chest. The area was then prepped and draped in the normal sterile fashion. 1% Lidocaine was used for local anesthesia. Under ultrasound guidance a 6 Fr Safe-T-Centesis catheter was introduced. Thoracentesis was performed. The catheter was removed and a dressing applied. FINDINGS: A total of approximately 900 mL of clear yellow fluid was removed. Samples were sent to the laboratory as requested by the clinical team. IMPRESSION: Successful ultrasound guided left thoracentesis yielding 900 mL of pleural fluid. Read by Candiss Norse, PA-C Electronically Signed   By: Aletta Edouard M.D.   On: 01/26/2018 13:39       IMPRESSION/PLAN: 1. 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 Dr. Lisbeth Renshaw has reviewed the case and would recommend a course of palliative radiotherapy to the chest. The patient appears to be stable from an oxygen standpoint. We would plan to simulate his treatment tomorrow, and anticipate this to begin on Monday 01/31/2018. If he had progressive trouble with his oxygen, we could intervene sooner but would reserve this for emergent needs only. We discussed the risks, benefits, short, and long term effects of radiotherapy, and the patient is interested in proceeding. Dr. Lisbeth Renshaw recommends a course of 2 weeks of radiotherapy. His case is very concerning since he has not received any systemic therapy. I have concerns that the family is not aware of the severity of his situation. I've contacted the thoracic oncology navigator to coordinate referral to Dr. Julien Nordmann as the patient has a community care referral for oncology care outside of the [place], which I confirmed with their nurses. Perhaps palliative care should also be aware of his case to help in coordinating his goals of care. I've also discussed his case with his attending and asked that he be considered for transfer to [facility] to initiate his treatment at the cancer center. 2. Confusion. An MRI  is pending at the time of dictation. We will discuss treatment if he had metastatic disease.   In a visit lasting 90 minutes, greater than 50% of the time was spent face to  face discussing his case, and in floor time, coordinating the patient's care.     Alison C. Perkins, PAC  

## 2018-01-28 NOTE — Progress Notes (Signed)
PCCM Interval Note  Notes reviewed. Appreciate input by radiation oncology.  Simulation planned for today, initiation of therapy to the left lower lobe bronchus to begin next week.  Agree that prognosis here may be worse than patient and family appreciate.  We will need to continue to discuss with them regarding this.  Also agree that input from medical oncology will be beneficial.  Consult them when he moves to [facility] for his XRT.   Please call if we can assist in any way  Baltazar Apo, MD, PhD 01/28/2018, 8:39 AM [facility] [phone] or if no answer [phone]

## 2018-01-28 NOTE — Progress Notes (Signed)
SLP Cancellation Note  Patient Details Name: Cody Lane MRN: [id number] DOB: [birth year]   Cancelled treatment:       Reason Eval/Treat Not Completed: Patient unavailable - at [facility] for radiation simulation. RN is not sure if he will stay at [facility] or return to [facility]. SLP will follow and schedule objective assessment as soon as able. Pt is currently NPO and not supported with non-oral feeding method.  Celia B. Quentin Ore [facility], CCC-SLP Speech Language Pathologist [phone]  Shonna Chock 01/28/2018, 10:18 AM

## 2018-01-28 NOTE — Progress Notes (Signed)
Pt left on stretcher  to [facility]with [facility].

## 2018-01-28 NOTE — Progress Notes (Signed)
Bed control called, no bed available here at this time with no estimation of when one will come available here at [facility].  [facility] here for transport back to [facility].  Wife notified.  Foley emptied.     Gloriajean Dell. Leonie Green, BSN

## 2018-01-28 NOTE — Progress Notes (Signed)
Nutrition Follow-up  DOCUMENTATION CODES:   Non-severe (moderate) malnutrition in context of chronic illness  INTERVENTION:   -RD will follow for diet advancement and resume supplements as appropriate -If pt and family desire TF and is within goals of care, recommend:  Initiate Osmolite 1.5 @ 20 ml/hr and increase by 10 ml every 8 hours to goal rate of 60 ml/hr.   30 ml Prostat BID.    Tube feeding regimen provides 2360 kcal (100% of needs), 120 grams of protein, and 1097 ml of H2O.   -If TF is initiated, monitor K, Mg, and Phos daily x 3 days and replete as needed due to high refeeding risk  NUTRITION DIAGNOSIS:   Moderate Malnutrition related to chronic illness(CHF, COPD, lung cancer) as evidenced by energy intake < or equal to 75% for > or equal to 1 month, mild fat depletion, moderate fat depletion, mild muscle depletion, moderate muscle depletion, percent weight loss.  Ongoing  GOAL:   Patient will meet greater than or equal to 90% of their needs  Unmet  MONITOR:   PO intake, Supplement acceptance, Labs, Weight trends, Skin, I & O's  REASON FOR ASSESSMENT:   Consult Assessment of nutrition requirement/status  ASSESSMENT:   78 year old male with a past medical history significant for non-small cell lung cancer that has been recently diagnosed, COPD with emphysema, chronic congestive heart failure with reduced ejection fraction, A. Fib, history of aortic valve replacement.  As noted he was recently discharged 3 days ago after hospitalization for hypoxia and pneumonia as well as acute metabolic encephalopathy due to those conditions.  At that hospitalization he ended up being discharged on oral antibiotics and with home oxygen therapy.  However this morning he removed his oxygen to go to the bathroom in the bathroom he developed left-sided chest pain and felt that he had a "heart trouble" additionally his wife [name] reports to me that she is very concerned about his  weakness which also prompted her to bring him in for evaluation.  1/14- s/p lt thoracentesis (900 ml yielded) 1/16- s/p BSE- recommend NPO status; may pursue instrumental testing if pt and family desire; MRI revealed small acute /early subacute infarction within the bilateral , posterior, frontal lobe  Reviewed I/O's: +25 ml x 24 hours and -1.3 L since admission  Reviewed wt; noted pt has experienced a 4.8% wt loss over the past week, which is significant for time frame. Pt with poor oral intake during admission (PO: 0-50%), with limited acceptance of supplements.   Case discussed with SLP and RN. SLP reports high concern of pt's swallowing status and recommended that pt remain NPO, until at least when SLP revaluates. Per RN, pt NPO today to further swallow testing. Pt with transfer orders to [facility] and will likely stay there to start palliative radiation scheduled for Monday, 01/31/18. SLP reports service will follow-up to [facility] for further swallow evaluation if pt does not return to [facility].   Depending on pt's goals of care, pt may benefit from either temporary or more permanent means of nutrition, especially given prolonged PO intake, recommended NPO status, and malnutrition.   Labs reviewed.   Diet Order:   Diet Order    None      EDUCATION NEEDS:   Education needs have been addressed  Skin:  Skin Assessment: Skin Integrity Issues: Skin Integrity Issues:: Other (Comment) Other: MASD groin  Last BM:  01/25/18  Height:   Ht Readings from Last 1 Encounters:  01/20/18 6\' 1"  (1.854  m)    Weight:   Wt Readings from Last 1 Encounters:  01/28/18 71.4 kg    Ideal Body Weight:  83.6 kg  BMI:  Body mass index is 20.77 kg/m.  Estimated Nutritional Needs:   Kcal:  2250-2450  Protein:  115-130 grams  Fluid:  > 2.2 L    Bary Limbach A. Jimmye Norman, RD, LDN, CDE Pager: [phone] After hours Pager: [phone]

## 2018-01-28 NOTE — Progress Notes (Deleted)
Radiation Oncology         [phone] ________________________________  Name: Cody Lane        MRN: [id number]  Date of Service: 01/27/2018 DOB: September 23, 1940  TD:HRCBUL, [name]  No ref. provider found     REFERRING PHYSICIAN: No ref. provider found   DIAGNOSIS: The primary encounter diagnosis was Nonspecific chest pain. Diagnoses of SOB (shortness of breath), Dyspnea, Pleural effusion, and S/P thoracentesis were also pertinent to this visit.   HISTORY OF PRESENT ILLNESS: Cody Lane is a 78 y.o. male seen at the request of Dr. Lamonte Sakai for a recently diagnosed stage IV lung cancer. The patient's history is complicated by multiple medical admissions and ED visits. He began having symptoms of dizziness and [facility] and during a work up for his dizziness an MRI of the brain was negative. He did have CTA of the neck that showed left proximal internal carotid artery dissection versus thrombus. He did not require vascular surgery repair. He presented on 10/15/17 with similar symptoms and headache with ptosis of hi slef eye. CT angiogram revealed supraclavicular adenopathy, no dissection was noted, and he was diagnosed with new a fib. He had been diagnosed with recent infarct. At that time he had additional lung work up that showed a mass in the LLL, and  multiple pulmonary nodules and extensive adenopathy. The patient declined biopsy then and wanted to follow up with the [place]. PET imaging on 11/18/2018 coordinated by Dr. Halford Chessman at his post hospital follow up revealed a 4.7 x 3.4 cm LLL retro hilar mass with an SUV of 15.2. Bilateral supraclavicular, prevascular, right paratracheal, left paratracheal, paraesophageal, subcarinal, AP window, and right hilar adenopathy was noted. He has severe emphysema. 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 He has met with medical oncology at the [facility] and has decided to proceed with treatment in [facility], however his oncology care has been delayed due to multiple hospitalizations as a result of hypoxemia, pneumonia and confusion. He is currently hospitalized and his wife tells me he's only been out of the hospital 2 days in the last month. He presented this time on 01/20/2018 to [facility] with chest pain, was found to be in A Fib, and chronic respiratory failure with hypoxia, and acute on chronic CHF. He has had continued confusion as well. We've been asked to see him to initiate palliative radiotherapy to the chest. His last dedicated chest imaging was in December with CT. At that time there had be progression of the left hilar mass with interval occlusion of the inferiori left pulmonary vein and narrowing distal distal left bronchus and lobar branches. He had progressive mediastinal adenopathy as well. He has multiple aneurysms of the thoracic aorta, and bilateral renal and iliac arteries. He is seen today to consider palliative treatment to the chest. Of note, he has been between 90-97% with his O2. He's been taking off his O2 and his readings are hard to gauge, but he's supposed to have been on 5L [place] per nursing.     PREVIOUS RADIATION THERAPY: No   PAST MEDICAL HISTORY:  Past Medical History:  Diagnosis Date  . Aortic valve regurgitation 01/26/14  . Atrial fibrillation ([place])   . 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 CHF (congestive heart failure) ([place]) 01/26/2014  . COPD (chronic obstructive pulmonary disease) ([place])   .  CVA (cerebral vascular accident) ([place]) 10/2017   "left side still weak" (01/20/2018)  . Emphysema (subcutaneous) (surgical) resulting from a procedure   . Heart murmur   . Hypertension   . 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 Lung cancer ([place]) dx'd 12/2017  . Lung mass   . Mitral valve regurgitation 01/26/2014  . Myocardial infarction ([place])    "I've had 2; last 1 was ~ 1 wk ago" (01/20/2018)    . On home oxygen therapy    "2L; 24/7" (01/20/2018)  . Pneumonia    "several times" (01/20/2018)  . Pseudophakia 08/17/2013  . 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 Thoracic aortic aneurysm ([place])   . Vitamin D deficiency        PAST SURGICAL HISTORY: Past Surgical History:  Procedure Laterality Date  . AORTIC VALVE REPLACEMENT    . CARDIAC VALVE REPLACEMENT    . CATARACT EXTRACTION W/ INTRAOCULAR LENS  IMPLANT, BILATERAL    . CORONARY ANGIOPLASTY WITH STENT PLACEMENT     "I have 2 stents" (01/20/2018)  . INGUINAL HERNIA REPAIR Left   . IR THORACENTESIS ASP PLEURAL SPACE W/IMG GUIDE  01/26/2018     FAMILY HISTORY:  Family History  Problem Relation Age of Onset  . Stroke Father      SOCIAL HISTORY:  reports that he quit smoking about 13 years ago. His smoking use included cigarettes. He quit after 45.00 years of use. He has never used smokeless tobacco. He reports current alcohol use of about 8.0 standard drinks of alcohol per week. He reports that he does not use drugs.   ALLERGIES: Patient has no known allergies.   MEDICATIONS:  Current Facility-Administered Medications  Medication Dose Route Frequency Provider Last Rate Last Dose  . acetaminophen (TYLENOL) tablet 650 mg  650 mg Oral Q6H PRN Kalman Shan Ratliff, DO       Or  . acetaminophen (TYLENOL) suppository 650 mg  650 mg Rectal Q6H PRN Hoffman, Jessica Ratliff, DO      . AEROCHAMBER PLUS FLO-VU MEDIUM MISC 1 each  1 each Other Once Corey Harold, NP      . apixaban Arne Cleveland) tablet 5 mg  5 mg Oral BID Kalman Shan Ratliff, DO   5 mg at 01/27/18 2112  . atorvastatin (LIPITOR) tablet 40 mg  40 mg Oral Daily Kalman Shan Ratliff, DO   40 mg at 01/27/18 1008  . feeding supplement (ENSURE ENLIVE) (ENSURE ENLIVE) liquid 237 mL  237 mL Oral BID BM Hoffman, Erik C, DO   237 mL at 01/27/18 1731  . feeding supplement (PRO-STAT SUGAR FREE 64) liquid 30 mL  30 mL Oral TID Joni Reining C, DO   30 mL at 01/27/18 1731  .  gabapentin (NEURONTIN) capsule 400 mg  400 mg Oral BID Kalman Shan Ratliff, DO   400 mg at 01/27/18 2112  . levalbuterol (XOPENEX) nebulizer solution 0.63 mg  0.63 mg Nebulization Q3H PRN Corey Harold, NP      . loratadine (CLARITIN) tablet 10 mg  10 mg Oral Daily Kalman Shan Ratliff, DO   10 mg at 01/27/18 1008  . MEDLINE mouth rinse  15 mL Mouth Rinse BID Lucious Groves, DO   15 mL at 01/27/18 2124  . metoprolol tartrate (LOPRESSOR) tablet 50 mg  50 mg Oral TID Welford Roche, MD   50 mg at 01/27/18 2112  . multivitamin with minerals tablet 1 tablet  1 tablet Oral Daily Lucious Groves, DO  1 tablet at 01/27/18 1009  . ondansetron (ZOFRAN) injection 4 mg  4 mg Intravenous Q8H PRN Carroll Sage, MD   4 mg at 01/26/18 1037  . senna-docusate (Senokot-S) tablet 1 tablet  1 tablet Oral QHS PRN Hoffman, Jessica Ratliff, DO      . umeclidinium bromide (INCRUSE ELLIPTA) 62.5 MCG/INH 1 puff  1 puff Inhalation Daily Joni Reining C, DO   1 puff at 01/27/18 0349     REVIEW OF SYSTEMS: On review of systems, the patient is unable to give a reivew of systems due to his confusion. He is able to tell me he is in the hospital. He coughs during our discussion and has thick productive white mucous. His wife reports he has chronic cough, and is short of breath with any exertion. He's lost more than 50 pounds in the last 6 months. He has had fevers during times of pneumonia. He has not verbalized any other concerns, but had been very dizzi previously when he was able to communicate his symptoms. .    PHYSICAL EXAM:  Wt Readings from Last 3 Encounters:  01/27/18 162 lb 0.6 oz (73.5 kg)  12/15/17 173 lb 12.8 oz (78.8 kg)  12/06/17 174 lb (78.9 kg)   Temp Readings from Last 3 Encounters:  01/28/18 98.8 F (37.1 C) (Oral)  12/02/17 98.7 F (37.1 C) (Oral)  10/18/17 98.3 F (36.8 C) (Oral)   BP Readings from Last 3 Encounters:  01/28/18 93/69  12/15/17 118/72  12/06/17 104/80    Pulse Readings from Last 3 Encounters:  01/28/18 (!) 105  12/15/17 79  12/06/17 68   Pain Assessment Pain Score: Asleep/10  In general this is a chronically ill appearing African American male who is alert and oriented to place and person.  HEENT reveals that the patient is normocephalic, atraumatic. EOMs are intact.  Skin is intact without any evidence of gross lesions. Cardiovascular exam reveals an irregular rate and rhythm, no clicks rubs or murmurs are auscultated. Chest reveals wheezing audible without auscultation of the chest. Lymphatic assessment is performed and reveals fullness in the supraclavicular chains. The abdomen is soft, non tender, non distended.    ECOG = 4  0 - Asymptomatic (Fully active, able to carry on all predisease activities without restriction)  1 - Symptomatic but completely ambulatory (Restricted in physically strenuous activity but ambulatory and able to carry out work of a light or sedentary nature. For example, light housework, office work)  2 - Symptomatic, <50% in bed during the day (Ambulatory and capable of all self care but unable to carry out any work activities. Up and about more than 50% of waking hours)  3 - Symptomatic, >50% in bed, but not bedbound (Capable of only limited self-care, confined to bed or chair 50% or more of waking hours)  4 - Bedbound (Completely disabled. Cannot carry on any self-care. Totally confined to bed or chair)  5 - Death   Eustace Pen MM, Creech RH, Tormey DC, et al. [phone]). "Toxicity and response criteria of the [facility]". [place] Oncol. 5 (6): 649-55    LABORATORY DATA:  Lab Results  Component Value Date   WBC 10.2 01/27/2018   HGB 11.6 (L) 01/27/2018   HCT 38.8 (L) 01/27/2018   MCV 97.2 01/27/2018   PLT 135 (L) 01/27/2018   Lab Results  Component Value Date   NA 143 01/28/2018   K 4.3 01/28/2018   CL 96 (L) 01/28/2018   CO2  36 (H) 01/28/2018   No results found for: ALT,  AST, GGT, ALKPHOS, BILITOT    RADIOGRAPHY: Dg Chest 2 View  Result Date: 01/20/2018 CLINICAL DATA:  Left chest pain. EXAM: CHEST - 2 VIEW COMPARISON:  12/24/2017 FINDINGS: Previous median sternotomy CABG procedure. Aortic atherosclerosis. Small bilateral pleural effusions and mild diffuse interstitial edema identified. Asymmetric opacification of the left lower lobe is identified which may reflect asymmetric edema, pneumonia or postobstructive pneumonitis secondary to known central hilar mass. IMPRESSION: 1. Cardiac enlargement and CHF. 2. Asymmetric opacification of the left lower lobe which may represent asymmetric edema, pneumonia versus progressive postobstructive pneumonitis secondary to known central left hilar lung mass. Electronically Signed   By: Kerby Moors M.D.   On: 01/20/2018 09:01   Ct Head Wo Contrast  Result Date: 01/25/2018 CLINICAL DATA:  Mental status changes, speech difficulty, history lung cancer, CHF, AVR, stroke, atrial fibrillation, COPD, hypertension, coronary artery disease post MI EXAM: CT HEAD WITHOUT CONTRAST TECHNIQUE: Contiguous axial images were obtained from the base of the skull through the vertex without intravenous contrast. Sagittal and coronal MPR images reconstructed from axial data set. COMPARISON:  10/15/2017 FINDINGS: Brain: Generalized atrophy. Normal ventricular morphology. No midline shift or mass effect. Small vessel chronic ischemic changes of deep cerebral white matter. Lacunar infarct RIGHT basal ganglia age indeterminate but new since prior exam. Old lacunar infarct LEFT thalamus. No intracranial hemorrhage or focal mass lesion. No extra-axial fluid collections. Vascular: Unremarkable Skull: Intact Sinuses/Orbits: Clear Other: N/A IMPRESSION: Atrophy with small vessel chronic ischemic changes of deep cerebral white matter. Age-indeterminate lacunar infarct RIGHT basal ganglia, new since 10/15/2017. Old LEFT thalamic lacunar infarct. No intracranial  hemorrhage identified. Electronically Signed   By: Lavonia Dana M.D.   On: 01/25/2018 18:47   Mr Jeri Cos LN Contrast  Result Date: 01/27/2018 CLINICAL DATA:  79 y/o M; mental status changes, speech difficulty, history of lung cancer. EXAM: MRI HEAD WITHOUT AND WITH CONTRAST TECHNIQUE: Multiplanar, multiecho pulse sequences of the brain and surrounding structures were obtained without and with intravenous contrast. CONTRAST:  7 cc Gadavist COMPARISON:  01/25/2018 CT head.  10/28/2017 MRI head. FINDINGS: Brain: Motion degraded study. 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 Small chronic lacunar infarcts are present within the left thalamus and bilateral caudate heads. Small chronic infarct of the left anterior corona radiata. Stable confluent nonspecific T2 FLAIR hyperintensities in subcortical and periventricular white matter are compatible with moderate to severe chronic microvascular ischemic changes for age. Moderate volume loss of the brain. Numerous stable scattered foci of susceptibility hypointensity compatible with chronic microhemorrhage, central predominance, likely related to chronic hypertension. No abnormal enhancement of the brain. Small linear foci of reduced diffusion are present within the posterior frontal centrum semiovale bilaterally (series 8, image 16) compatible with acute/early subacute infarctions. A few additional punctate foci in white matter may be present bilaterally. Vascular: Normal flow voids. Skull and upper cervical spine: Normal marrow signal. Sinuses/Orbits: Negative. Other: Bilateral intra-ocular lens replacement. IMPRESSION: 1. Small acute/early subacute infarctions within the bilateral posterior frontal lobe centrum semiovale. No hemorrhage or mass effect. Few probable additional scattered punctate foci of infarction, partially obscured by motion artifact. Distribution favors watershed infarction. 2. Stable moderate to severe chronic  microvascular ischemic changes and moderate volume loss of the brain. 3. Stable foci of chronic microhemorrhage, likely related to chronic hypertension. 4. No intracranial metastasis identified. These results will be called to the ordering clinician or representative by the Radiologist Assistant, and communication  documented in the PACS or zVision Dashboard. Electronically Signed   By: Kristine Garbe M.D.   On: 01/27/2018 16:09   Dg Chest Port 1 View  Result Date: 01/26/2018 CLINICAL DATA:  .  Left thoracentesis. EXAM: PORTABLE CHEST 1 VIEW COMPARISON:  Chest x-ray dated January 20, 2018. FINDINGS: Stable cardiomegaly status post CABG. Mild diffuse interstitial edema is similar to prior study. Unchanged small right pleural effusion. No definite residual left pleural effusion status post thoracentesis. Patchy increased density in the left lower lobe is unchanged. No pneumothorax. No acute osseous abnormality. IMPRESSION: 1. No definite residual left pleural effusion status post thoracentesis. No pneumothorax. 2. Unchanged interstitial pulmonary edema and left lower lobe opacity which could reflect alveolar edema or pneumonia. Electronically Signed   By: Titus Dubin M.D.   On: 01/26/2018 13:34   Ir Thoracentesis Asp Pleural Space W/img Guide  Result Date: 01/26/2018 INDICATION: Patient with history of paroxysmal a.fib with RVR, AKI, HTN, acute hypoxic respiratory failure - request for diagnostic and therapeutic thoracentesis today at bedside. EXAM: ULTRASOUND GUIDED LEFT THORACENTESIS MEDICATIONS: 10 mL 1% lidocaine COMPLICATIONS: None immediate. PROCEDURE: An ultrasound guided thoracentesis was thoroughly discussed with the patient's wife and questions answered. The benefits, risks, alternatives and complications were also discussed. The patient's wife understands and wishes to proceed with the procedure. Written consent was obtained. Ultrasound was performed to localize and mark an adequate  pocket of fluid in the left chest. The area was then prepped and draped in the normal sterile fashion. 1% Lidocaine was used for local anesthesia. Under ultrasound guidance a 6 Fr Safe-T-Centesis catheter was introduced. Thoracentesis was performed. The catheter was removed and a dressing applied. FINDINGS: A total of approximately 900 mL of clear yellow fluid was removed. Samples were sent to the laboratory as requested by the clinical team. IMPRESSION: Successful ultrasound guided left thoracentesis yielding 900 mL of pleural fluid. Read by Candiss Norse, PA-C Electronically Signed   By: Aletta Edouard M.D.   On: 01/26/2018 13:39       IMPRESSION/PLAN: 1. 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 Dr. Lisbeth Renshaw has reviewed the case and would recommend a course of palliative radiotherapy to the chest. The patient appears to be stable from an oxygen standpoint. We would plan to simulate his treatment tomorrow, and anticipate this to begin on Monday 01/31/2018. If he had progressive trouble with his oxygen, we could intervene sooner but would reserve this for emergent needs only. We discussed the risks, benefits, short, and long term effects of radiotherapy, and the patient is interested in proceeding. Dr. Lisbeth Renshaw recommends a course of 2 weeks of radiotherapy. His case is very concerning since he has not received any systemic therapy. I have concerns that the family is not aware of the severity of his situation. I've contacted the thoracic oncology navigator to coordinate referral to Dr. Julien Nordmann as the patient has a community care referral for oncology care outside of the [place], which I confirmed with their nurses. Perhaps palliative care should also be aware of his case to help in coordinating his goals of care. I've also discussed his case with his attending and asked that he be considered for transfer to [facility] to initiate his treatment at the cancer center. 2. Confusion. An MRI  is pending at the time of dictation. We will discuss treatment if he had metastatic disease.   In a visit lasting 90 minutes, greater than 50% of the time was spent face to  face discussing his case, and in floor time, coordinating the patient's care.     Carola Rhine, PAC

## 2018-01-29 ENCOUNTER — Inpatient Hospital Stay (HOSPITAL_COMMUNITY): Payer: No Typology Code available for payment source

## 2018-01-29 MED ORDER — SODIUM CHLORIDE 0.9 % IV SOLN
INTRAVENOUS | Status: DC
  Administered 2018-01-29: 17:00:00 via INTRAVENOUS

## 2018-01-29 NOTE — Progress Notes (Signed)
Patient transferring  to [facility] report called and wife was made aware earlier room number 1401 given. [facility] used to transport.

## 2018-01-29 NOTE — Progress Notes (Signed)
PROGRESS NOTE    Cody Lane  HEN:[id number] DOB: 1940-12-08 DOA: 01/20/2018 PCP: [name]  Brief Narrative:  78 year old male with a history of emphysematous COPD, systolic CHF, atrial fibrillation, recent CVA who is admitted for acute hypoxic respiratory failure.  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  He was in the process of initiating chemotherapy through the [place] system but this was deferred because he became hypoxemic and was hospitalized on these occasions.  He was recently discharged and readmitted to [facility] with acute hypoxic respiratory failure secondary to acute on chronic systolic CHF, known lung cancer, chronic COPD, and community-acquired pneumonia.  He completed a full course of antibiotics for CAP and has been diuresed for pulmonary edema.  He underwent a thoracentesis with 900 cc of fluid removed.  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  Despite removal of fluid he continues to have increased supplemental oxygen requirements.  Pulmonology and radiation oncology both consulted and recommended transfer to [facility] for palliative radiation. He will undergo stimulation here prior to transfer.  His hospital course has been complicated by by hypotension, persistent atrial fibrillation with RVR, intermittent confusion, difficulty swallowing, and subacute stroke (please see significant events below).  Significant Events: 01/25/2018 Head CT: Atrophy with small vessel chronic ischemic changes of deep cerebral white matter. Age-indeterminate lacunar infarct RIGHT basal ganglia, new since 10/15/2017. Old LEFT thalamic lacunar infarct. No intracranial hemorrhage identified. 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 01/26/2018 CXR:  1. No definite residual left pleural effusion status post thoracentesis. No pneumothorax. 2. Unchanged interstitial pulmonary edema and left lower lobe opacity which could reflect alveolar edema or pneumonia. 01/27/2018 MRI Brain: 1. Small acute/early subacute infarctions within the bilateral posterior frontal lobe centrum semiovale. No hemorrhage or mass effect. Few probable additional scattered punctate foci of infarction, partially obscured by motion artifact. Distribution favors watershed infarction. 2. Stable moderate to severe chronic microvascular ischemic changes and moderate volume loss of the brain. 3. Stable foci of chronic microhemorrhage, likely related to chronic hypertension. 4. No intracranial metastasis identified.  Consultants:  01/21/2018 Palliative Medicine: Family and patient would like full scope of care including radiation and chemotherapy. 01/27/2018 Pulmonology 01/27/2018 Radiation Oncology 01/29/2018 patient was seen in the room.  He was tachycardic heart rate about 1 22-1 30s before receiving metoprolol.  No family was at bedside.  Patient reports that he does feel short of breath.  Denies chest pain and nausea vomiting.  It is noted that he failed swallow evaluation at [facility] and is scheduled to have a barium swallow test done tomorrow at [facility].   Principal Problem:   Non-small cell carcinoma of lung ([place]) Active Problems:   Essential hypertension   COPD (chronic obstructive pulmonary disease) with emphysema (HCC)   Chronic respiratory failure with hypoxia, on home O2 therapy (HCC)   Acute on chronic systolic heart failure (HCC)   Physical deconditioning   CAP (community acquired pneumonia)   Malnutrition of moderate degree   Goals of care, counseling/discussion   Palliative care by specialist   Paroxysmal atrial fibrillation (HCC)   Pleural effusion   S/P thoracentesis  #1 acute  hypoxic respiratory failure secondary to COPD exacerbation, acute on chronic systolic CHF, non-small cell lung cancer.  He is still dependent on oxygen.  Patient was seen by pulmonary and XRT at [facility] and recommended transfer to [facility] long for palliative radiation to decrease his lung cancer with possible improvement in his respiratory status.  2 history of paroxysmal atrial fibrillation with RVR patient's heart rate remained elevated above over 100.  Continue 3 times a day metoprolol.  Continue Eliquis.  #3 hypertension patient is soft blood pressure continue all medications except metoprolol as he needs metoprolol for A. fib with RVR.  #4 AKI stable    Nutrition Problem: Moderate Malnutrition Etiology: chronic illness(CHF, COPD, lung cancer)     Signs/Symptoms: energy intake < or equal to 75% for > or equal to 1 month, mild fat depletion, moderate fat depletion, mild muscle depletion, moderate muscle depletion, percent weight loss Percent weight loss: 9.9 %    Interventions: Magic cup, Ensure Enlive (each supplement provides 350kcal and 20 grams of protein), MVI  Estimated body mass index is 21.26 kg/m as calculated from the following:   Height as of this encounter: 6' (1.829 m).   Weight as of this encounter: 71.1 kg.  DVT prophylaxis: Eliquis Code Status: Full code Family Communication: Patient's wife was in the room when I went back for the second time but she hesitated to have the phone to speak to me. Disposition Plan ending clinical improvement Consultants: Oncology Procedures: None Antimicrobials: None Subjective: Patient reports that he is short of breath and has palpitations  Objective: Vitals:   01/29/18 0627 01/29/18 0750 01/29/18 1052 01/29/18 1337  BP: (!) 133/106 (!) 126/96 109/84 (!) 114/94  Pulse: (!) 123 96  66  Resp: 20   20  Temp: 98.5 F (36.9 C)     TempSrc: Oral     SpO2: 93%   96%  Weight:      Height:        Intake/Output Summary (Last 24  hours) at 01/29/2018 1428 Last data filed at 01/29/2018 1006 Gross per 24 hour  Intake 357 ml  Output -  Net 357 ml   Filed Weights   01/27/18 0300 01/28/18 0500 01/29/18 0227  Weight: 73.5 kg 71.4 kg 71.1 kg    Examination:  General exam: Appears calm and comfortable  Respiratory system: Coarse to auscultation. Respiratory effort normal. Cardiovascular system: S1 & S2 heard, RRR. No JVD, murmurs, rubs, gallops or clicks. No pedal edema. Gastrointestinal system: Abdomen is nondistended, soft and nontender. No organomegaly or masses felt. Normal bowel sounds heard. Central nervous system: Alert and oriented. No focal neurological deficits. Extremities: Symmetric 5 x 5 power. Skin: No rashes, lesions or ulcers Psychiatry: Judgement and insight appear normal. Mood & affect appropriate.     Data Reviewed: I have personally reviewed following labs and imaging studies  CBC: Recent Labs  Lab 01/23/18 0205 01/24/18 0242 01/25/18 0651 01/26/18 0252 01/27/18 0300  WBC 11.8* 12.7* 11.5* 11.3* 10.2  NEUTROABS 8.9*  --  9.1* 8.3* 7.4  HGB 11.6* 11.7* 12.2* 12.6* 11.6*  HCT 38.6* 38.7* 40.8 42.5 38.8*  MCV 95.5 96.5 96.7 97.3 97.2  PLT 153 145* 163 143* 240*   Basic Metabolic Panel: Recent Labs  Lab 01/24/18 0242 01/25/18 0257 01/26/18 0252 01/27/18 0300 01/28/18 0214  NA 139 139 142 140 143  K 4.4 4.2 4.6 4.6 4.3  CL 97* 96* 96* 96* 96*  CO2 34* 35* 32 36* 36*  GLUCOSE 112* 99 90 101* 113*  BUN 25* 27* 34* 40* 53*  CREATININE 1.36* 1.44* 1.64* 1.73* 1.64*  CALCIUM 8.4* 8.8* 9.1 8.9 9.0   GFR: Estimated Creatinine Clearance: 37.9 mL/min (A) (by C-G formula based on SCr of 1.64 mg/dL (H)). Liver Function Tests: Recent Labs  Lab 01/25/18 0941  PROT 6.8   No results for input(s): LIPASE, AMYLASE in the last 168 hours. No results for input(s): AMMONIA in the last 168 hours. Coagulation Profile: No results for input(s): INR, PROTIME in the last 168 hours. Cardiac  Enzymes: No results for input(s): CKTOTAL, CKMB, CKMBINDEX, TROPONINI in the last 168 hours. BNP (last 3 results) No results for input(s): PROBNP in the last 8760 hours. HbA1C: No results for input(s): HGBA1C in the last 72 hours. CBG: No results for input(s): GLUCAP in the last 168 hours. Lipid Profile: No results for input(s): CHOL, HDL, LDLCALC, TRIG, CHOLHDL, LDLDIRECT in the last 72 hours. Thyroid Function Tests: No results for input(s): TSH, T4TOTAL, FREET4, T3FREE, THYROIDAB in the last 72 hours. Anemia Panel: No results for input(s): VITAMINB12, FOLATE, FERRITIN, TIBC, IRON, RETICCTPCT in the last 72 hours. Sepsis Labs: No results for input(s): PROCALCITON, LATICACIDVEN in the last 168 hours.  Recent Results (from the past 240 hour(s))  Gram stain     Status: None   Collection Time: 01/26/18  1:12 PM  Result Value Ref Range Status   Specimen Description FLUID  Final   Special Requests PLEURAL LEFT  Final   Gram Stain   Final    WBC PRESENT,BOTH PMN AND MONONUCLEAR NO ORGANISMS SEEN CYTOSPIN SMEAR Performed at [facility], [address], [place], [place] [address]    Report Status 01/26/2018 FINAL  Final         Radiology Studies: Mr Jeri Cos WC Contrast  Result Date: 01/27/2018 CLINICAL DATA:  78 y/o M; mental status changes, speech difficulty, history of lung cancer. EXAM: MRI HEAD WITHOUT AND WITH CONTRAST TECHNIQUE: Multiplanar, multiecho pulse sequences of the brain and surrounding structures were obtained without and with intravenous contrast. CONTRAST:  7 cc Gadavist COMPARISON:  01/25/2018 CT head.  10/28/2017 MRI head. FINDINGS: Brain: Motion degraded study. 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 Small chronic lacunar infarcts are present within the left thalamus and bilateral caudate heads. Small chronic infarct of the left anterior corona radiata. Stable confluent nonspecific T2 FLAIR hyperintensities in  subcortical and periventricular white matter are compatible with moderate to severe chronic microvascular ischemic changes for age. Moderate volume loss of the brain. Numerous stable scattered foci of susceptibility hypointensity compatible with chronic microhemorrhage, central predominance, likely related to chronic hypertension. No abnormal enhancement of the brain. Small linear foci of reduced diffusion are present within the posterior frontal centrum semiovale bilaterally (series 8, image 16) compatible with acute/early subacute infarctions. A few additional punctate foci in white matter may be present bilaterally. Vascular: Normal flow voids. Skull and upper cervical spine: Normal marrow signal. Sinuses/Orbits: Negative. Other: Bilateral intra-ocular lens replacement. IMPRESSION: 1. Small acute/early subacute infarctions within the bilateral posterior frontal lobe centrum semiovale. No hemorrhage or mass effect. Few probable additional scattered punctate foci of infarction, partially obscured by motion artifact. Distribution favors watershed infarction. 2. Stable moderate to severe chronic microvascular ischemic changes and moderate volume loss of the brain. 3. Stable foci of chronic microhemorrhage, likely related to chronic hypertension. 4. No intracranial metastasis identified. These results will be called to the ordering clinician or representative by the Radiologist Assistant, and communication documented in the PACS or zVision Dashboard. Electronically Signed   By: Edgardo Roys.D.  On: 01/27/2018 16:09        Scheduled Meds: . apixaban  5 mg Oral BID  . atorvastatin  40 mg Oral Daily  . feeding supplement (ENSURE ENLIVE)  237 mL Oral BID BM  . feeding supplement (PRO-STAT SUGAR FREE 64)  30 mL Oral TID  . gabapentin  400 mg Oral BID  . loratadine  10 mg Oral Daily  . mouth rinse  15 mL Mouth Rinse BID  . metoprolol tartrate  50 mg Oral TID  . multivitamin with minerals  1  tablet Oral Daily  . umeclidinium bromide  1 puff Inhalation Daily   Continuous Infusions:   LOS: 9 days    Georgette Shell, MD [facility]  If 7PM-7AM, please contact night-coverage [email or web address] Password [email or web address] 01/29/2018, 2:28 PM

## 2018-01-30 DIAGNOSIS — J90 Pleural effusion, not elsewhere classified: Secondary | ICD-10-CM

## 2018-01-30 LAB — BASIC METABOLIC PANEL
Anion gap: 9 (ref 5–15)
BUN: 54 mg/dL — ABNORMAL HIGH (ref 8–23)
CO2: 35 mmol/L — ABNORMAL HIGH (ref 22–32)
Calcium: 9 mg/dL (ref 8.9–10.3)
Chloride: 103 mmol/L (ref 98–111)
Creatinine, Ser: 1.47 mg/dL — ABNORMAL HIGH (ref 0.61–1.24)
GFR calc Af Amer: 53 mL/min — ABNORMAL LOW (ref >60)
GFR calc non Af Amer: 45 mL/min — ABNORMAL LOW (ref >60)
Glucose, Bld: 103 mg/dL — ABNORMAL HIGH (ref 70–99)
POTASSIUM: 4.2 mmol/L (ref 3.5–5.1)
Sodium: 147 mmol/L — ABNORMAL HIGH (ref 135–145)

## 2018-01-30 LAB — CBC
HCT: 41.8 % (ref 39.0–52.0)
HEMOGLOBIN: 12 g/dL — AB (ref 13.0–17.0)
MCH: 29.4 pg (ref 26.0–34.0)
MCHC: 28.7 g/dL — ABNORMAL LOW (ref 30.0–36.0)
MCV: 102.5 fL — ABNORMAL HIGH (ref 80.0–100.0)
Platelets: 127 10*3/uL — ABNORMAL LOW (ref 150–400)
RBC: 4.08 MIL/uL — ABNORMAL LOW (ref 4.22–5.81)
RDW: 13.4 % (ref 11.5–15.5)
WBC: 11.8 10*3/uL — ABNORMAL HIGH (ref 4.0–10.5)
nRBC: 0 % (ref 0.0–0.2)

## 2018-01-30 MED ORDER — SODIUM CHLORIDE 0.45 % IV SOLN
INTRAVENOUS | Status: DC
  Administered 2018-01-30 – 2018-02-08 (×15): via INTRAVENOUS

## 2018-01-30 NOTE — Progress Notes (Signed)
PROGRESS NOTE    Cody Lane  MVH:[id number] DOB: 1940/10/21 DOA: 01/20/2018 PCP: [name]  Brief Narrative:78 year old male with a history of emphysematous COPD, systolic CHF, atrial fibrillation, recent CVA who is admitted for acute hypoxic respiratory failure. 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 He was in the process of initiating chemotherapy through the [place] system but this was deferred because he became hypoxemic and was hospitalized on these occasions. He was recently discharged and readmitted to [facility] acute hypoxic respiratory failure secondary to acute on chronic systolic CHF, knownlung cancer, chronic COPD, and community-acquired pneumonia. He completed a full course of antibiotics for CAP and has been diuresed for pulmonary edema. He underwent a thoracentesis with 900 cc of fluid removed. 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 Despite removal of fluid he continues to have increased supplemental oxygen requirements. Pulmonology and radiation oncology both consulted and recommended transfer to [facility] for palliative radiation. He will undergo stimulation here prior to transfer.  His hospital course has been complicated by by hypotension, persistent atrial fibrillation with RVR, intermittent confusion, difficulty swallowing, and subacute stroke (please see significant events below   Significant Events: 01/25/2018 Head WU:[id number] with small vessel chronic ischemic changes of deep cerebral white matter. Age-indeterminate lacunar infarct RIGHT basal ganglia, new since 10/15/2017. Old LEFT thalamic lacunar infarct. No intracranial hemorrhage identified. 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 01/26/2018 CXR: 1. No definite residual left pleural effusion status post thoracentesis. No pneumothorax. 2. Unchanged interstitial pulmonary edema and left lower lobe opacity which could reflect alveolar edema or pneumonia. 01/27/2018 MRI Brain: 1. Small acute/early subacute infarctions within the bilateral posterior frontal lobe centrum semiovale. No hemorrhage or mass effect. Few probable additional scattered punctate foci of infarction, partially obscured by motion artifact. Distribution favors watershed infarction. 2. Stable moderate to severe chronic microvascular ischemic changes and moderate volume loss of the brain. 3. Stable foci of chronic microhemorrhage, likely related to chronic hypertension. 4. No intracranial metastasis identified.  Assessment & Plan:   Principal Problem:   Non-small cell carcinoma of lung (HCC) Active Problems:   Essential hypertension   COPD (chronic obstructive pulmonary disease) with emphysema (HCC)   Chronic respiratory failure with hypoxia, on home O2 therapy (HCC)   Acute on chronic systolic heart failure (HCC)   Physical deconditioning   CAP (community acquired pneumonia)   Malnutrition of moderate degree   Goals of care, counseling/discussion   Palliative care by specialist   Paroxysmal atrial fibrillation (HCC)   Pleural effusion   S/P thoracentesis   #1 acute hypoxic respiratory failure secondary to COPD exacerbation, acute on chronic systolic CHF, non-small cell lung cancer.  He is still dependent on oxygen.  Patient was seen by pulmonary and XRT at [facility] and recommended transfer to [facility] long for palliative radiation to decrease his lung cancer with possible improvement in his respiratory status.  2 history of paroxysmal atrial fibrillation with RVR patient's heart rate remained elevated above over 100.  Continue 3 times a day metoprolol.  Continue Eliquis.  #3 hypertension patient is  soft blood pressure continue all medications except metoprolol as he needs metoprolol for A. fib with RVR.  #4 AKI stable  #5 hypernatremic patient is getting more hypernatremic will change fluids to half-normal  saline.   Nutrition Problem: Moderate Malnutrition Etiology: chronic illness(CHF, COPD, lung cancer)     Signs/Symptoms: energy intake < or equal to 75% for > or equal to 1 month, mild fat depletion, moderate fat depletion, mild muscle depletion, moderate muscle depletion, percent weight loss Percent weight loss: 9.9 %    Interventions: Magic cup, Ensure Enlive (each supplement provides 350kcal and 20 grams of protein), MVI  Estimated body mass index is 20.57 kg/m as calculated from the following:   Height as of this encounter: 6' (1.829 m).   Weight as of this encounter: 68.8 kg.  DVT prophylaxis: Eliquis Code Status: Full code Family Communication: None Disposition Plan Pending clinical progress Consultants:  Oncology consulted  Procedures: None Antimicrobials: None Subjective: Patient is resting in bed appears to be more comfortable than yesterday saturation 95% heart rate 104.  Objective: Vitals:   01/30/18 0534 01/30/18 0936 01/30/18 0944 01/30/18 1205  BP: 105/84 109/81 109/81   Pulse: (!) 101 (!) 113 70 86  Resp: 18     Temp: 98.8 F (37.1 C)     TempSrc: Oral     SpO2:   (!) 75% 94%  Weight:      Height:        Intake/Output Summary (Last 24 hours) at 01/30/2018 1255 Last data filed at 01/30/2018 0900 Gross per 24 hour  Intake 586.86 ml  Output 475 ml  Net 111.86 ml   Filed Weights   01/28/18 0500 01/29/18 0227 01/30/18 0426  Weight: 71.4 kg 71.1 kg 68.8 kg    Examination:  General exam: Appears calm and comfortable  Respiratory system: Clear to auscultation. Respiratory effort normal. Cardiovascular system: S1 & S2 heard, RRR. No JVD, murmurs, rubs, gallops or clicks. No pedal edema. Gastrointestinal system: Abdomen is nondistended,  soft and nontender. No organomegaly or masses felt. Normal bowel sounds heard. Central nervous system: Alert and oriented. No focal neurological deficits. Extremities: Symmetric 5 x 5 power. Skin: No rashes, lesions or ulcers Psychiatry: Judgement and insight appear normal. Mood & affect appropriate.     Data Reviewed: I have personally reviewed following labs and imaging studies  CBC: Recent Labs  Lab 01/24/18 0242 01/25/18 0651 01/26/18 0252 01/27/18 0300 01/30/18 0711  WBC 12.7* 11.5* 11.3* 10.2 11.8*  NEUTROABS  --  9.1* 8.3* 7.4  --   HGB 11.7* 12.2* 12.6* 11.6* 12.0*  HCT 38.7* 40.8 42.5 38.8* 41.8  MCV 96.5 96.7 97.3 97.2 102.5*  PLT 145* 163 143* 135* 161*   Basic Metabolic Panel: Recent Labs  Lab 01/25/18 0257 01/26/18 0252 01/27/18 0300 01/28/18 0214 01/30/18 0711  NA 139 142 140 143 147*  K 4.2 4.6 4.6 4.3 4.2  CL 96* 96* 96* 96* 103  CO2 35* 32 36* 36* 35*  GLUCOSE 99 90 101* 113* 103*  BUN 27* 34* 40* 53* 54*  CREATININE 1.44* 1.64* 1.73* 1.64* 1.47*  CALCIUM 8.8* 9.1 8.9 9.0 9.0   GFR: Estimated Creatinine Clearance: 41 mL/min (A) (by C-G formula based on SCr of 1.47 mg/dL (H)). Liver Function Tests: Recent Labs  Lab 01/25/18 0941  PROT 6.8   No results for input(s): LIPASE, AMYLASE in the last 168 hours. No results for input(s): AMMONIA in the last 168 hours. Coagulation Profile: No results for input(s): INR, PROTIME in the last 168 hours. Cardiac Enzymes: No results for input(s): CKTOTAL, CKMB, CKMBINDEX, TROPONINI in the last 168 hours. BNP (last 3 results) No results for input(s): PROBNP in the last 8760 hours.  HbA1C: No results for input(s): HGBA1C in the last 72 hours. CBG: No results for input(s): GLUCAP in the last 168 hours. Lipid Profile: No results for input(s): CHOL, HDL, LDLCALC, TRIG, CHOLHDL, LDLDIRECT in the last 72 hours. Thyroid Function Tests: No results for input(s): TSH, T4TOTAL, FREET4, T3FREE, THYROIDAB in the last  72 hours. Anemia Panel: No results for input(s): VITAMINB12, FOLATE, FERRITIN, TIBC, IRON, RETICCTPCT in the last 72 hours. Sepsis Labs: No results for input(s): PROCALCITON, LATICACIDVEN in the last 168 hours.  Recent Results (from the past 240 hour(s))  Gram stain     Status: None   Collection Time: 01/26/18  1:12 PM  Result Value Ref Range Status   Specimen Description FLUID  Final   Special Requests PLEURAL LEFT  Final   Gram Stain   Final    WBC PRESENT,BOTH PMN AND MONONUCLEAR NO ORGANISMS SEEN CYTOSPIN SMEAR Performed at [facility], [address] [place], [place] [address]    Report Status 01/26/2018 FINAL  Final         Radiology Studies: Dg Swallowing Func-speech Pathology  Result Date: 01/29/2018 Objective Swallowing Evaluation: Type of Study: MBS-Modified Barium Swallow Study  Patient Details Name: LASALLE ABEE MRN: [id number] Date of Birth: September 12, 1940 Today's Date: 01/29/2018 Time: SLP Start Time (ACUTE ONLY): 1401 -SLP Stop Time (ACUTE ONLY): 1424 SLP Time Calculation (min) (ACUTE ONLY): 23 min Past Medical History: Past Medical History: Diagnosis Date . Aortic valve regurgitation 01/26/14 . Atrial fibrillation ([place])  . 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 CHF (congestive heart failure) ([place]) 01/26/2014 . COPD (chronic obstructive pulmonary disease) ([place])  . CVA (cerebral vascular accident) ([place]) 10/2017  "left side still weak" (01/20/2018) . Emphysema (subcutaneous) (surgical) resulting from a procedure  . Heart murmur  . Hypertension  . 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 Lung cancer ([place]) dx'd 12/2017 . Lung mass  . Mitral valve regurgitation 01/26/2014 . Myocardial infarction ([place])   "I've had 2; last 1 was ~ 1 wk ago" (01/20/2018) . On home oxygen therapy   "2L; 24/7" (01/20/2018) . Pneumonia   "several times" (01/20/2018) . Pseudophakia 08/17/2013 . 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 Thoracic aortic aneurysm ([place])  . Vitamin D deficiency  Past Surgical History: Past Surgical History:  Procedure Laterality Date . AORTIC VALVE REPLACEMENT   . CARDIAC VALVE REPLACEMENT   . CATARACT EXTRACTION W/ INTRAOCULAR LENS  IMPLANT, BILATERAL   . CORONARY ANGIOPLASTY WITH STENT PLACEMENT    "I have 2 stents" (01/20/2018) . INGUINAL HERNIA REPAIR Left  . IR THORACENTESIS ASP PLEURAL SPACE W/IMG GUIDE  01/26/2018 HPI: 78 year old male admitted 01/20/2018 PMH: non-small cell lung cancer, COPD, emphysema, AFib, AVR, aneurysm, CHF, HTN, A&MVRegurg, HLD, recurrent PNA. Recent hospitalization for hypoxia and PNA amd acute metabolic encephalopathy. CXR = interstitial pulmonary edema and left lower lobe opacity which could reflect alveolar edema or pneumonia; BSE completed on 01/28/18 indicating need for MBS d/t overt s/s of aspiration.  Subjective: Pt confused/requires cueing during MBS Assessment / Plan / Recommendation CHL IP CLINICAL IMPRESSIONS 01/29/2018 Clinical Impression Mild oropharyngeal dysphagia characterized by lingual pumping with puree consistency, oral holding prior to swallow with premature spillage to valleculae/pyriform sinuses placing pt at mild-moderate risk for aspiration during intake d/t decreased mentation/efficiency of swallow; flash penetration with larger boluses of thin/nectar  Noted x1, but eliminated with smaller amounts given; mild vallecular residue noted, but eliminated with repetitive swallows and effortful swallow and alternating solids/liquids; potential esophageal component with backflow possibility (difficult to assess  d/t pt positioning); pt coughing during intake, but no material appeared to enter airway during MBS; fatigue factor may impact meal tolerance and consumption to maintain nutrition/hydration; pt will continue to be followed by ST for diet tolerance and upgrade when mentation/strength improved; recommend Dysphagia 1/nectar-thick liquids d/t mentation/efficiency of swallow function. SLP Visit Diagnosis Dysphagia, oropharyngeal phase (R13.12) Attention and concentration  deficit following -- Frontal lobe and executive function deficit following -- Impact on safety and function Mild-moderate aspiration risk.   CHL IP TREATMENT RECOMMENDATION 01/29/2018 Treatment Recommendations Therapy as outlined in treatment plan below   Prognosis 01/29/2018 Prognosis for Safe Diet Advancement Guarded Barriers to Reach Goals Cognitive deficits;Time post onset;Severity of deficits Barriers/Prognosis Comment -- CHL IP DIET RECOMMENDATION 01/29/2018 SLP Diet Recommendations Dysphagia 1 (Puree) solids;Nectar thick liquid;Other (small amounts) Liquid Administration via Cup;Other (Comment);Straw Medication Administration Crushed with puree Compensations Minimize environmental distractions;Slow rate;Small sips/bites;Follow solids with liquid;Multiple dry swallows after each bite/sip;Effortful swallow Postural Changes Seated upright at 90 degrees   CHL IP OTHER RECOMMENDATIONS 01/29/2018 Recommended Consults Consider esophageal assessment Oral Care Recommendations Oral care BID Other Recommendations Order thickener from pharmacy   CHL IP FOLLOW UP RECOMMENDATIONS 01/29/2018 Follow up Recommendations 24 hour supervision/assistance;Skilled Nursing facility   [facility] IP FREQUENCY AND DURATION 01/29/2018 Speech Therapy Frequency (ACUTE ONLY) min 1 x/week Treatment Duration 1 week;2 weeks      CHL IP ORAL PHASE 01/29/2018 Oral Phase Impaired Oral - Pudding Teaspoon -- Oral - Pudding Cup -- Oral - Honey Teaspoon -- Oral - Honey Cup -- Oral - Nectar Teaspoon -- Oral - Nectar Cup -- Oral - Nectar Straw -- Oral - Thin Teaspoon Premature spillage Oral - Thin Cup Premature spillage Oral - Thin Straw Premature spillage Oral - Puree Lingual pumping;Delayed oral transit Oral - Mech Soft -- Oral - Regular -- Oral - Multi-Consistency -- Oral - Pill -- Oral Phase - Comment --  CHL IP PHARYNGEAL PHASE 01/29/2018 Pharyngeal Phase Impaired Pharyngeal- Pudding Teaspoon -- Pharyngeal -- Pharyngeal- Pudding Cup -- Pharyngeal -- Pharyngeal-  Honey Teaspoon -- Pharyngeal -- Pharyngeal- Honey Cup -- Pharyngeal -- Pharyngeal- Nectar Teaspoon Delayed swallow initiation-vallecula Pharyngeal -- Pharyngeal- Nectar Cup -- Pharyngeal -- Pharyngeal- Nectar Straw Delayed swallow initiation-vallecula;Delayed swallow initiation-pyriform sinuses Pharyngeal -- Pharyngeal- Thin Teaspoon Delayed swallow initiation-pyriform sinuses Pharyngeal -- Pharyngeal- Thin Cup -- Pharyngeal -- Pharyngeal- Thin Straw Delayed swallow initiation-pyriform sinuses;Penetration/Aspiration during swallow Pharyngeal Material enters airway, remains ABOVE vocal cords then ejected out Pharyngeal- Puree Delayed swallow initiation-vallecula;Pharyngeal residue - valleculae;Compensatory strategies attempted (with notebox);Other (alternated liquids/solids, effortful swallow, repetitive swallow) Pharyngeal -- Pharyngeal- Mechanical Soft Other (Attempted, but unable to be fully assessed d/t pt non-compliance d/t mentation) Pharyngeal -- Pharyngeal- Regular -- Pharyngeal -- Pharyngeal- Multi-consistency -- Pharyngeal -- Pharyngeal- Pill -- Pharyngeal -- Pharyngeal Comment --  CHL IP CERVICAL ESOPHAGEAL PHASE 01/29/2018 Cervical Esophageal Phase Impaired Pudding Teaspoon -- Pudding Cup -- Honey Teaspoon -- Honey Cup -- Nectar Teaspoon -- Nectar Cup -- Nectar Straw -- Thin Teaspoon -- Thin Cup -- Thin Straw Other (Comment) Puree Esophageal backflow into cervical esophagus;Other (DTA with positioning of pt, but coughing noted intermittently during intake; potential for backflow primarily with larger boluses and timing of swallow initiation) Mechanical Soft -- Regular -- Multi-consistency -- Pill -- Cervical Esophageal Comment -- Elvina Sidle, M.S., CCC-SLP 01/29/2018, 4:58 PM                   Scheduled Meds: . apixaban  5 mg Oral BID  . atorvastatin  40 mg Oral  Daily  . feeding supplement (ENSURE ENLIVE)  237 mL Oral BID BM  . feeding supplement (PRO-STAT SUGAR FREE 64)  30 mL Oral TID  .  gabapentin  400 mg Oral BID  . loratadine  10 mg Oral Daily  . mouth rinse  15 mL Mouth Rinse BID  . metoprolol tartrate  50 mg Oral TID  . multivitamin with minerals  1 tablet Oral Daily  . umeclidinium bromide  1 puff Inhalation Daily   Continuous Infusions: . sodium chloride 50 mL/hr at 01/30/18 0400     LOS: 10 days     Georgette Shell, MD [facility]  If 7PM-7AM, please contact night-coverage [email or web address] Password [facility] 01/30/2018, 12:55 PM

## 2018-01-30 NOTE — Progress Notes (Signed)
Patient refused to take last bite of crushed medications in applesauce. Patient educated on importance of taking medications.

## 2018-01-30 NOTE — Consult Note (Signed)
[place] [phone]   [phone]   HOSPITAL CONSULT NOTE  REFERRING PHYSICIAN: Dr. Jacki Cones  REASON FOR CONSULTATION:  78 years old male with lung cancer.  HPI Cody Lane is a 78 y.o. male with past medical history significant for multiple medical problems including history of hypertension, COPD, CHF, cerebral aneurysm, atrial fibrillation, aortic valve regurgitation, stroke, recurrent pneumonia, renal artery aneurysm, thoracic aortic aneurysm as well as myocardial infarction and vitamin D deficiency.  In October 2019 the patient had CT angiogram of the neck and head for evaluation of diplopia.  Has a scan of the neck showed left supraclavicular and mediastinal lymphadenopathy.  This was followed by CT scan of the chest with contrast on October 17, 2017 and that showed central left lower lobe 4.5 cm lung mass with associated mild external narrowing of the distal left mainstem bronchus and proximal left upper and left lower lobe airways.  Primary bronchogenic carcinoma was highly suspected.  There was also several subcentimeter solid pulmonary nodules scattered in both lungs suspicious for pulmonary metastasis.  The patient also has bilateral hilar, subcarinal, right paratracheal, AP window and left supraclavicular adenopathy suspicious for metastatic disease.  MRI of the brain at that time showed no evidence of metastatic disease to the brain.  The patient was followed by Dr. Halford Chessman on outpatient basis and a PET scan was performed on November 17, 2017 and that showed 4.7 cm hypermetabolic left lower lobe retrohilar mass with abnormal bilateral supraclavicular, bilateral mediastinal and right hilar adenopathy.  There was also scattered small pulmonary nodules that were not hypermetabolic.  There was no findings for metastatic disease to the neck, abdomen/pelvis or skeleton.  On December 02, 2017 the patient underwent ultrasound-guided core biopsy of a  left supraclavicular lymph node by interventional radiology.  The final pathology [id number]) was consistent with a squamous cell carcinoma.  The patient was followed by the [facility] in [facility] and he was expected to start a course of systemic chemotherapy recently.  He was admitted to [facility] few times with pneumonia as well as acute hypoxic respiratory failure in addition to acute on chronic congestive heart failure.  The patient was undergoing treatment for community-acquired pneumonia.  He was also found to have left-sided pleural fluid and he underwent ultrasound-guided left thoracentesis with drainage of 900 mL of pleural fluid.  His last imaging studies in December 2019 showed progression of the left hilar mass with interval occlusion of the inferior left pulmonary vein and narrowing of the distal left bronchus and lobar branches.  There was also progressive mediastinal adenopathy and bilateral pulmonary nodules.  The patient was seen by radiation oncology and expected to start a course of palliative radiotherapy to the chest tomorrow.  He was transferred to [facility] for evaluation and starting the palliative radiotherapy. I was consulted today to see the patient and give recommendation regarding his condition and future treatment options. When seen today the patient is feeling fine except for the baseline shortness of breath and fatigue.  He denied having any significant fever or chills.  He has no nausea, vomiting, diarrhea or constipation.  He denied having any headache or visual changes. Family history significant for father with cancer. The patient is married and has 2 children.  He is currently retired.  He has a long history for smoking. HPI  Past Medical History:  Diagnosis Date  . Aortic valve regurgitation 01/26/14  . Atrial fibrillation (  [place])   . 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 CHF (congestive heart failure) ([place]) 01/26/2014  . COPD (chronic  obstructive pulmonary disease) ([place])   . CVA (cerebral vascular accident) ([place]) 10/2017   "left side still weak" (01/20/2018)  . Emphysema (subcutaneous) (surgical) resulting from a procedure   . Heart murmur   . Hypertension   . 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 Lung cancer ([place]) dx'd 12/2017  . Lung mass   . Mitral valve regurgitation 01/26/2014  . Myocardial infarction ([place])    "I've had 2; last 1 was ~ 1 wk ago" (01/20/2018)  . On home oxygen therapy    "2L; 24/7" (01/20/2018)  . Pneumonia    "several times" (01/20/2018)  . Pseudophakia 08/17/2013  . Renal artery aneurysm ([place])   . Thoracic aortic aneurysm ([place])   . Vitamin D deficiency     Past Surgical History:  Procedure Laterality Date  . AORTIC VALVE REPLACEMENT    . CARDIAC VALVE REPLACEMENT    . CATARACT EXTRACTION W/ INTRAOCULAR LENS  IMPLANT, BILATERAL    . CORONARY ANGIOPLASTY WITH STENT PLACEMENT     "I have 2 stents" (01/20/2018)  . INGUINAL HERNIA REPAIR Left   . IR THORACENTESIS ASP PLEURAL SPACE W/IMG GUIDE  01/26/2018    Family History  Problem Relation Age of Onset  . Stroke Father     Social History Social History   Tobacco Use  . Smoking status: Former Smoker    Years: 45.00    Types: Cigarettes    Last attempt to quit: 2007    Years since quitting: 13.0  . Smokeless tobacco: Never Used  Substance Use Topics  . Alcohol use: Yes    Alcohol/week: 8.0 standard drinks    Types: 8 Cans of beer per week  . Drug use: No    No Known Allergies  Current Facility-Administered Medications  Medication Dose Route Frequency Provider Last Rate Last Dose  . 0.45 % sodium chloride infusion   Intravenous Continuous Georgette Shell, MD 50 mL/hr at 01/30/18 1454    . acetaminophen (TYLENOL) tablet 650 mg  650 mg Oral Q6H PRN Kalman Shan Ratliff, DO       Or  . acetaminophen (TYLENOL) suppository 650 mg  650 mg Rectal Q6H PRN Kalman Shan Ratliff, DO   650 mg at 01/28/18 1442  . apixaban (ELIQUIS) tablet 5 mg   5 mg Oral BID Kalman Shan Ratliff, DO   5 mg at 01/30/18 1941  . atorvastatin (LIPITOR) tablet 40 mg  40 mg Oral Daily Kalman Shan Ratliff, DO   40 mg at 01/30/18 0940  . feeding supplement (ENSURE ENLIVE) (ENSURE ENLIVE) liquid 237 mL  237 mL Oral BID BM Hoffman, Erik C, DO   237 mL at 01/29/18 1050  . feeding supplement (PRO-STAT SUGAR FREE 64) liquid 30 mL  30 mL Oral TID Joni Reining C, DO   30 mL at 01/29/18 2344  . gabapentin (NEURONTIN) capsule 400 mg  400 mg Oral BID Kalman Shan Ratliff, DO   400 mg at 01/29/18 2344  . levalbuterol (XOPENEX) nebulizer solution 0.63 mg  0.63 mg Nebulization Q3H PRN Corey Harold, NP      . loratadine (CLARITIN) tablet 10 mg  10 mg Oral Daily Kalman Shan Ratliff, DO   10 mg at 01/30/18 0939  . MEDLINE mouth rinse  15 mL Mouth Rinse BID Joni Reining C, DO   15 mL at 01/30/18 0815  . metoprolol tartrate (LOPRESSOR) tablet  50 mg  50 mg Oral TID Isabelle Course, MD   50 mg at 01/30/18 0944  . multivitamin with minerals tablet 1 tablet  1 tablet Oral Daily Lucious Groves, DO   1 tablet at 01/30/18 0943  . ondansetron (ZOFRAN) injection 4 mg  4 mg Intravenous Q8H PRN [place] Sage, MD   4 mg at 01/26/18 1037  . senna-docusate (Senokot-S) tablet 1 tablet  1 tablet Oral QHS PRN Hoffman, Jessica Ratliff, DO      . umeclidinium bromide (INCRUSE ELLIPTA) 62.5 MCG/INH 1 puff  1 puff Inhalation Daily Lucious Groves, DO   1 puff at 01/27/18 1610    Review of Systems  Constitutional: positive for anorexia, fatigue and weight loss Eyes: negative Ears, nose, mouth, throat, and face: negative Respiratory: positive for cough and dyspnea on exertion Cardiovascular: negative Gastrointestinal: negative Genitourinary:negative Integument/breast: negative Hematologic/lymphatic: negative Musculoskeletal:positive for muscle weakness Neurological: negative Behavioral/Psych: negative Endocrine: negative Allergic/Immunologic: negative  Physical  Exam  RUE:AVWUJ, healthy, no distress, ill looking and malnourished SKIN: skin color, texture, turgor are normal, no rashes or significant lesions HEAD: Normocephalic, No masses, lesions, tenderness or abnormalities EYES: normal, PERRLA, Conjunctiva are pink and non-injected EARS: External ears normal, Canals clear OROPHARYNX:no exudate, no erythema and lips, buccal mucosa, and tongue normal  NECK: supple, no adenopathy, no JVD LYMPH:  no palpable lymphadenopathy, no hepatosplenomegaly LUNGS: decreased breath sounds, prolonged expiratory phase, expiratory wheezes bilaterally HEART: regular rate & rhythm, no murmurs and no gallops ABDOMEN:abdomen soft, non-tender, normal bowel sounds and no masses or organomegaly BACK: Back symmetric, no curvature., No CVA tenderness EXTREMITIES:no joint deformities, effusion, or inflammation, no edema  NEURO: alert & oriented x 3 with fluent speech, no focal motor/sensory deficits  PERFORMANCE STATUS: ECOG 1-2  LABORATORY DATA: Lab Results  Component Value Date   WBC 11.8 (H) 01/30/2018   HGB 12.0 (L) 01/30/2018   HCT 41.8 01/30/2018   MCV 102.5 (H) 01/30/2018   PLT 127 (L) 01/30/2018    @LASTCHEM @  RADIOGRAPHIC STUDIES: Dg Chest 2 View  Result Date: 01/20/2018 CLINICAL DATA:  Left chest pain. EXAM: CHEST - 2 VIEW COMPARISON:  12/24/2017 FINDINGS: Previous median sternotomy CABG procedure. Aortic atherosclerosis. Small bilateral pleural effusions and mild diffuse interstitial edema identified. Asymmetric opacification of the left lower lobe is identified which may reflect asymmetric edema, pneumonia or postobstructive pneumonitis secondary to known central hilar mass. IMPRESSION: 1. Cardiac enlargement and CHF. 2. Asymmetric opacification of the left lower lobe which may represent asymmetric edema, pneumonia versus progressive postobstructive pneumonitis secondary to known central left hilar lung mass. Electronically Signed   By: Kerby Moors M.D.    On: 01/20/2018 09:01   Ct Head Wo Contrast  Result Date: 01/25/2018 CLINICAL DATA:  Mental status changes, speech difficulty, history lung cancer, CHF, AVR, stroke, atrial fibrillation, COPD, hypertension, coronary artery disease post MI EXAM: CT HEAD WITHOUT CONTRAST TECHNIQUE: Contiguous axial images were obtained from the base of the skull through the vertex without intravenous contrast. Sagittal and coronal MPR images reconstructed from axial data set. COMPARISON:  10/15/2017 FINDINGS: Brain: Generalized atrophy. Normal ventricular morphology. No midline shift or mass effect. Small vessel chronic ischemic changes of deep cerebral white matter. Lacunar infarct RIGHT basal ganglia age indeterminate but new since prior exam. Old lacunar infarct LEFT thalamus. No intracranial hemorrhage or focal mass lesion. No extra-axial fluid collections. Vascular: Unremarkable Skull: Intact Sinuses/Orbits: Clear Other: N/A IMPRESSION: Atrophy with small vessel chronic ischemic changes of deep cerebral white  matter. Age-indeterminate lacunar infarct RIGHT basal ganglia, new since 10/15/2017. Old LEFT thalamic lacunar infarct. No intracranial hemorrhage identified. Electronically Signed   By: Lavonia Dana M.D.   On: 01/25/2018 18:47   Mr Jeri Cos WN Contrast  Result Date: 01/27/2018 CLINICAL DATA:  78 y/o M; mental status changes, speech difficulty, history of lung cancer. EXAM: MRI HEAD WITHOUT AND WITH CONTRAST TECHNIQUE: Multiplanar, multiecho pulse sequences of the brain and surrounding structures were obtained without and with intravenous contrast. CONTRAST:  7 cc Gadavist COMPARISON:  01/25/2018 CT head.  10/28/2017 MRI head. FINDINGS: Brain: Motion degraded study. 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 Small chronic lacunar infarcts are present within the left thalamus and bilateral caudate heads. Small chronic infarct of the left anterior corona radiata. Stable confluent  nonspecific T2 FLAIR hyperintensities in subcortical and periventricular white matter are compatible with moderate to severe chronic microvascular ischemic changes for age. Moderate volume loss of the brain. Numerous stable scattered foci of susceptibility hypointensity compatible with chronic microhemorrhage, central predominance, likely related to chronic hypertension. No abnormal enhancement of the brain. Small linear foci of reduced diffusion are present within the posterior frontal centrum semiovale bilaterally (series 8, image 16) compatible with acute/early subacute infarctions. A few additional punctate foci in white matter may be present bilaterally. Vascular: Normal flow voids. Skull and upper cervical spine: Normal marrow signal. Sinuses/Orbits: Negative. Other: Bilateral intra-ocular lens replacement. IMPRESSION: 1. Small acute/early subacute infarctions within the bilateral posterior frontal lobe centrum semiovale. No hemorrhage or mass effect. Few probable additional scattered punctate foci of infarction, partially obscured by motion artifact. Distribution favors watershed infarction. 2. Stable moderate to severe chronic microvascular ischemic changes and moderate volume loss of the brain. 3. Stable foci of chronic microhemorrhage, likely related to chronic hypertension. 4. No intracranial metastasis identified. These results will be called to the ordering clinician or representative by the Radiologist Assistant, and communication documented in the PACS or zVision Dashboard. Electronically Signed   By: Kristine Garbe M.D.   On: 01/27/2018 16:09   Dg Chest Port 1 View  Result Date: 01/26/2018 CLINICAL DATA:  .  Left thoracentesis. EXAM: PORTABLE CHEST 1 VIEW COMPARISON:  Chest x-ray dated January 20, 2018. FINDINGS: Stable cardiomegaly status post CABG. Mild diffuse interstitial edema is similar to prior study. Unchanged small right pleural effusion. No definite residual left pleural  effusion status post thoracentesis. Patchy increased density in the left lower lobe is unchanged. No pneumothorax. No acute osseous abnormality. IMPRESSION: 1. No definite residual left pleural effusion status post thoracentesis. No pneumothorax. 2. Unchanged interstitial pulmonary edema and left lower lobe opacity which could reflect alveolar edema or pneumonia. Electronically Signed   By: Titus Dubin M.D.   On: 01/26/2018 13:34   Dg Swallowing Func-speech Pathology  Result Date: 01/29/2018 Objective Swallowing Evaluation: Type of Study: MBS-Modified Barium Swallow Study  Patient Details Name: JEBIDIAH BAGGERLY MRN: [id number] Date of Birth: 03-22-1940 Today's Date: 01/29/2018 Time: SLP Start Time (ACUTE ONLY): 1401 -SLP Stop Time (ACUTE ONLY): 1424 SLP Time Calculation (min) (ACUTE ONLY): 23 min Past Medical History: Past Medical History: Diagnosis Date . Aortic valve regurgitation 01/26/14 . Atrial fibrillation ([place])  . 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 CHF (congestive heart failure) ([place]) 01/26/2014 . COPD (chronic obstructive pulmonary disease) ([place])  . CVA (cerebral vascular accident) ([place]) 10/2017  "left side still weak" (01/20/2018) . Emphysema (subcutaneous) (surgical) resulting from a procedure  . Heart murmur  . Hypertension  . 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  Lung cancer ([place]) dx'd 12/2017 . Lung mass  . Mitral valve regurgitation 01/26/2014 . Myocardial infarction ([place])   "I've had 2; last 1 was ~ 1 wk ago" (01/20/2018) . On home oxygen therapy   "2L; 24/7" (01/20/2018) . Pneumonia   "several times" (01/20/2018) . Pseudophakia 08/17/2013 . Renal artery aneurysm ([place])  . Thoracic aortic aneurysm ([place])  . Vitamin D deficiency  Past Surgical History: Past Surgical History: Procedure Laterality Date . AORTIC VALVE REPLACEMENT   . CARDIAC VALVE REPLACEMENT   . CATARACT EXTRACTION W/ INTRAOCULAR LENS  IMPLANT, BILATERAL   . CORONARY ANGIOPLASTY WITH STENT PLACEMENT    "I have 2 stents" (01/20/2018) . INGUINAL HERNIA REPAIR Left  .  IR THORACENTESIS ASP PLEURAL SPACE W/IMG GUIDE  01/26/2018 HPI: 78 year old male admitted 01/20/2018 PMH: non-small cell lung cancer, COPD, emphysema, AFib, AVR, aneurysm, CHF, HTN, A&MVRegurg, HLD, recurrent PNA. Recent hospitalization for hypoxia and PNA amd acute metabolic encephalopathy. CXR = interstitial pulmonary edema and left lower lobe opacity which could reflect alveolar edema or pneumonia; BSE completed on 01/28/18 indicating need for MBS d/t overt s/s of aspiration.  Subjective: Pt confused/requires cueing during MBS Assessment / Plan / Recommendation CHL IP CLINICAL IMPRESSIONS 01/29/2018 Clinical Impression Mild oropharyngeal dysphagia characterized by lingual pumping with puree consistency, oral holding prior to swallow with premature spillage to valleculae/pyriform sinuses placing pt at mild-moderate risk for aspiration during intake d/t decreased mentation/efficiency of swallow; flash penetration with larger boluses of thin/nectar  Noted x1, but eliminated with smaller amounts given; mild vallecular residue noted, but eliminated with repetitive swallows and effortful swallow and alternating solids/liquids; potential esophageal component with backflow possibility (difficult to assess d/t pt positioning); pt coughing during intake, but no material appeared to enter airway during MBS; fatigue factor may impact meal tolerance and consumption to maintain nutrition/hydration; pt will continue to be followed by ST for diet tolerance and upgrade when mentation/strength improved; recommend Dysphagia 1/nectar-thick liquids d/t mentation/efficiency of swallow function. SLP Visit Diagnosis Dysphagia, oropharyngeal phase (R13.12) Attention and concentration deficit following -- Frontal lobe and executive function deficit following -- Impact on safety and function Mild-moderate aspiration risk.   CHL IP TREATMENT RECOMMENDATION 01/29/2018 Treatment Recommendations Therapy as outlined in treatment plan below    Prognosis 01/29/2018 Prognosis for Safe Diet Advancement Guarded Barriers to Reach Goals Cognitive deficits;Time post onset;Severity of deficits Barriers/Prognosis Comment -- CHL IP DIET RECOMMENDATION 01/29/2018 SLP Diet Recommendations Dysphagia 1 (Puree) solids;Nectar thick liquid;Other (small amounts) Liquid Administration via Cup;Other (Comment);Straw Medication Administration Crushed with puree Compensations Minimize environmental distractions;Slow rate;Small sips/bites;Follow solids with liquid;Multiple dry swallows after each bite/sip;Effortful swallow Postural Changes Seated upright at 90 degrees   CHL IP OTHER RECOMMENDATIONS 01/29/2018 Recommended Consults Consider esophageal assessment Oral Care Recommendations Oral care BID Other Recommendations Order thickener from pharmacy   CHL IP FOLLOW UP RECOMMENDATIONS 01/29/2018 Follow up Recommendations 24 hour supervision/assistance;Skilled Nursing facility   [facility] IP FREQUENCY AND DURATION 01/29/2018 Speech Therapy Frequency (ACUTE ONLY) min 1 x/week Treatment Duration 1 week;2 weeks      CHL IP ORAL PHASE 01/29/2018 Oral Phase Impaired Oral - Pudding Teaspoon -- Oral - Pudding Cup -- Oral - Honey Teaspoon -- Oral - Honey Cup -- Oral - Nectar Teaspoon -- Oral - Nectar Cup -- Oral - Nectar Straw -- Oral - Thin Teaspoon Premature spillage Oral - Thin Cup Premature spillage Oral - Thin Straw Premature spillage Oral - Puree Lingual pumping;Delayed oral transit Oral - Mech Soft -- Oral - Regular --  Oral - Multi-Consistency -- Oral - Pill -- Oral Phase - Comment --  CHL IP PHARYNGEAL PHASE 01/29/2018 Pharyngeal Phase Impaired Pharyngeal- Pudding Teaspoon -- Pharyngeal -- Pharyngeal- Pudding Cup -- Pharyngeal -- Pharyngeal- Honey Teaspoon -- Pharyngeal -- Pharyngeal- Honey Cup -- Pharyngeal -- Pharyngeal- Nectar Teaspoon Delayed swallow initiation-vallecula Pharyngeal -- Pharyngeal- Nectar Cup -- Pharyngeal -- Pharyngeal- Nectar Straw Delayed swallow  initiation-vallecula;Delayed swallow initiation-pyriform sinuses Pharyngeal -- Pharyngeal- Thin Teaspoon Delayed swallow initiation-pyriform sinuses Pharyngeal -- Pharyngeal- Thin Cup -- Pharyngeal -- Pharyngeal- Thin Straw Delayed swallow initiation-pyriform sinuses;Penetration/Aspiration during swallow Pharyngeal Material enters airway, remains ABOVE vocal cords then ejected out Pharyngeal- Puree Delayed swallow initiation-vallecula;Pharyngeal residue - valleculae;Compensatory strategies attempted (with notebox);Other (alternated liquids/solids, effortful swallow, repetitive swallow) Pharyngeal -- Pharyngeal- Mechanical Soft Other (Attempted, but unable to be fully assessed d/t pt non-compliance d/t mentation) Pharyngeal -- Pharyngeal- Regular -- Pharyngeal -- Pharyngeal- Multi-consistency -- Pharyngeal -- Pharyngeal- Pill -- Pharyngeal -- Pharyngeal Comment --  CHL IP CERVICAL ESOPHAGEAL PHASE 01/29/2018 Cervical Esophageal Phase Impaired Pudding Teaspoon -- Pudding Cup -- Honey Teaspoon -- Honey Cup -- Nectar Teaspoon -- Nectar Cup -- Nectar Straw -- Thin Teaspoon -- Thin Cup -- Thin Straw Other (Comment) Puree Esophageal backflow into cervical esophagus;Other (DTA with positioning of pt, but coughing noted intermittently during intake; potential for backflow primarily with larger boluses and timing of swallow initiation) Mechanical Soft -- Regular -- Multi-consistency -- Pill -- Cervical Esophageal Comment -- Elvina Sidle, M.S., CCC-SLP 01/29/2018, 4:58 PM              Ir Thoracentesis Asp Pleural Space W/img Guide  Result Date: 01/26/2018 INDICATION: Patient with history of paroxysmal a.fib with RVR, AKI, HTN, acute hypoxic respiratory failure - request for diagnostic and therapeutic thoracentesis today at bedside. EXAM: ULTRASOUND GUIDED LEFT THORACENTESIS MEDICATIONS: 10 mL 1% lidocaine COMPLICATIONS: None immediate. PROCEDURE: An ultrasound guided thoracentesis was thoroughly discussed with the patient's  wife and questions answered. The benefits, risks, alternatives and complications were also discussed. The patient's wife understands and wishes to proceed with the procedure. Written consent was obtained. Ultrasound was performed to localize and mark an adequate pocket of fluid in the left chest. The area was then prepped and draped in the normal sterile fashion. 1% Lidocaine was used for local anesthesia. Under ultrasound guidance a 6 Fr Safe-T-Centesis catheter was introduced. Thoracentesis was performed. The catheter was removed and a dressing applied. FINDINGS: A total of approximately 900 mL of clear yellow fluid was removed. Samples were sent to the laboratory as requested by the clinical team. IMPRESSION: Successful ultrasound guided left thoracentesis yielding 900 mL of pleural fluid. Read by Candiss Norse, PA-C Electronically Signed   By: Aletta Edouard M.D.   On: 01/26/2018 13:39    ASSESSMENT: This is a 78 years old male recently diagnosed with a stage IV (T2 b, N3, M1 a) non-small cell lung cancer, squamous cell carcinoma diagnosed in October 2019.  He presented with large left infrahilar mass in addition to bilateral mediastinal and supraclavicular lymphadenopathy as well as bilateral pulmonary nodules and likely malignant left pleural effusion.   PLAN: I had a lengthy discussion with the patient today about his condition, stage, prognosis and treatment options. I explained to the patient that he has incurable condition and all the treatment will be of palliative nature. I recommended for the patient to complete the course of palliative radiotherapy to the obstructive left hilar mass to help with his breathing. I also discussed with the patient his treatment options  including palliative care versus palliative systemic chemotherapy.  The patient mentioned that he would be interested in considering systemic chemotherapy. I will arrange for the patient a follow-up appointment with me at the  cancer center after completion of his palliative radiotherapy for discussion of the systemic chemotherapy options.  I may consider him for treatment with reduced dose carboplatin, paclitaxel and Keytruda followed by maintenance Keytruda if he has no evidence for disease progression after the induction phase. For the postobstructive and community-acquired pneumonia, continue his current treatment with antibiotics for now. For the congestive heart failure he will continue with his current medication by the hospitalist and cardiology. I strongly encouraged the patient to quit smoking. The patient voices understanding of current disease status and treatment options and is in agreement with the current care plan. All questions were answered. The patient knows to call the clinic with any problems, questions or concerns. We can certainly see the patient much sooner if necessary.  Thank you so much for allowing me to participate in the care of Cody Lane. I will continue to follow up the patient with you and assist in his care.  Disclaimer: This note was dictated with voice recognition software. Similar sounding words can inadvertently be transcribed and may not be corrected upon review.   Eilleen Kempf January 30, 2018, 3:01 PM

## 2018-01-31 ENCOUNTER — Ambulatory Visit
Admit: 2018-01-31 | Discharge: 2018-01-31 | Disposition: A | Discharge status: Home / Self Care | Payer: Medicare Other | Attending: Radiation Oncology | Admitting: Radiation Oncology

## 2018-01-31 ENCOUNTER — Inpatient Hospital Stay (HOSPITAL_COMMUNITY): Payer: No Typology Code available for payment source

## 2018-01-31 ENCOUNTER — Ambulatory Visit: Payer: Medicare Other

## 2018-01-31 ENCOUNTER — Ambulatory Visit: Admit: 2018-01-31 | Payer: Medicare Other | Admitting: Radiation Oncology

## 2018-01-31 DIAGNOSIS — C3432 Malignant neoplasm of lower lobe, left bronchus or lung: Principal | ICD-10-CM

## 2018-01-31 LAB — BASIC METABOLIC PANEL
Anion gap: 9 (ref 5–15)
BUN: 51 mg/dL — ABNORMAL HIGH (ref 8–23)
CO2: 32 mmol/L (ref 22–32)
Calcium: 8.9 mg/dL (ref 8.9–10.3)
Chloride: 104 mmol/L (ref 98–111)
Creatinine, Ser: 1.4 mg/dL — ABNORMAL HIGH (ref 0.61–1.24)
GFR calc non Af Amer: 48 mL/min — ABNORMAL LOW (ref >60)
GFR, EST AFRICAN AMERICAN: 56 mL/min — AB (ref >60)
Glucose, Bld: 110 mg/dL — ABNORMAL HIGH (ref 70–99)
Potassium: 4.3 mmol/L (ref 3.5–5.1)
SODIUM: 145 mmol/L (ref 135–145)

## 2018-01-31 LAB — CBC
HCT: 38.6 % — ABNORMAL LOW (ref 39.0–52.0)
Hemoglobin: 11.5 g/dL — ABNORMAL LOW (ref 13.0–17.0)
MCH: 29.3 pg (ref 26.0–34.0)
MCHC: 29.8 g/dL — ABNORMAL LOW (ref 30.0–36.0)
MCV: 98.5 fL (ref 80.0–100.0)
NRBC: 0 % (ref 0.0–0.2)
Platelets: 130 10*3/uL — ABNORMAL LOW (ref 150–400)
RBC: 3.92 MIL/uL — ABNORMAL LOW (ref 4.22–5.81)
RDW: 13.7 % (ref 11.5–15.5)
WBC: 12.3 10*3/uL — ABNORMAL HIGH (ref 4.0–10.5)

## 2018-01-31 LAB — COMPREHENSIVE METABOLIC PANEL
ALT: 17 U/L (ref 0–44)
AST: 32 U/L (ref 15–41)
Albumin: 2.7 g/dL — ABNORMAL LOW (ref 3.5–5.0)
Alkaline Phosphatase: 62 U/L (ref 38–126)
Anion gap: 8 (ref 5–15)
BUN: 53 mg/dL — ABNORMAL HIGH (ref 8–23)
CHLORIDE: 103 mmol/L (ref 98–111)
CO2: 32 mmol/L (ref 22–32)
Calcium: 8.7 mg/dL — ABNORMAL LOW (ref 8.9–10.3)
Creatinine, Ser: 1.25 mg/dL — ABNORMAL HIGH (ref 0.61–1.24)
GFR calc Af Amer: >60 mL/min (ref >60)
GFR calc non Af Amer: 55 mL/min — ABNORMAL LOW (ref >60)
Glucose, Bld: 109 mg/dL — ABNORMAL HIGH (ref 70–99)
POTASSIUM: 4.3 mmol/L (ref 3.5–5.1)
SODIUM: 143 mmol/L (ref 135–145)
Total Bilirubin: 1.3 mg/dL — ABNORMAL HIGH (ref 0.3–1.2)
Total Protein: 6.7 g/dL (ref 6.5–8.1)

## 2018-01-31 LAB — MAGNESIUM: Magnesium: 2.4 mg/dL (ref 1.7–2.4)

## 2018-01-31 MED ORDER — RESOURCE THICKENUP CLEAR PO POWD
ORAL | Status: DC | PRN
  Filled 2018-01-31: qty 125

## 2018-01-31 MED ORDER — METOPROLOL TARTRATE 5 MG/5ML IV SOLN
5.0000 mg | Freq: Four times a day (QID) | INTRAVENOUS | Status: AC | PRN
  Administered 2018-01-31 – 2018-02-01 (×2): 5 mg via INTRAVENOUS
  Filled 2018-01-31 (×2): qty 5

## 2018-01-31 MED ORDER — METOPROLOL TARTRATE 5 MG/5ML IV SOLN
5.0000 mg | INTRAVENOUS | Status: AC | PRN
  Administered 2018-01-31 (×2): 5 mg via INTRAVENOUS
  Filled 2018-01-31 (×2): qty 5

## 2018-01-31 NOTE — Procedures (Addendum)
Objective Swallowing Evaluation: Type of Study: MBS-Modified Barium Swallow Study   Patient Details  Name: Cody Lane MRN: [id number] Date of Birth: 08-21-1940  Today's Date: 01/31/2018 Time: SLP Start Time (ACUTE ONLY): 1401 -SLP Stop Time (ACUTE ONLY): 1424  SLP Time Calculation (min) (ACUTE ONLY): 23 min   Past Medical History:  Past Medical History:  Diagnosis Date  . Aortic valve regurgitation 01/26/14  . Atrial fibrillation ([place])   . 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 CHF (congestive heart failure) ([employer]) 01/26/2014  . COPD (chronic obstructive pulmonary disease) ([place])   . CVA (cerebral vascular accident) ([place]) 10/2017   "left side still weak" (01/20/2018)  . Emphysema (subcutaneous) (surgical) resulting from a procedure   . Heart murmur   . Hypertension   . 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 Lung cancer ([place]) dx'd 12/2017  . Lung mass   . Mitral valve regurgitation 01/26/2014  . Myocardial infarction ([place])    "I've had 2; last 1 was ~ 1 wk ago" (01/20/2018)  . On home oxygen therapy    "2L; 24/7" (01/20/2018)  . Pneumonia    "several times" (01/20/2018)  . Pseudophakia 08/17/2013  . 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 Thoracic aortic aneurysm ([place])   . Vitamin D deficiency    Past Surgical History:  Past Surgical History:  Procedure Laterality Date  . AORTIC VALVE REPLACEMENT    . CARDIAC VALVE REPLACEMENT    . CATARACT EXTRACTION W/ INTRAOCULAR LENS  IMPLANT, BILATERAL    . CORONARY ANGIOPLASTY WITH STENT PLACEMENT     "I have 2 stents" (01/20/2018)  . INGUINAL HERNIA REPAIR Left   . IR THORACENTESIS ASP PLEURAL SPACE W/IMG GUIDE  01/26/2018   HPI: 78 year old male admitted 01/20/2018 PMH: non-small cell lung cancer, COPD, emphysema, AFib, AVR, aneurysm, CHF, HTN, A&MVRegurg, HLD, recurrent PNA. Recent hospitalization for hypoxia and PNA amd acute metabolic encephalopathy. CXR = interstitial pulmonary edema and left lower lobe opacity which could reflect alveolar edema or pneumonia. SLE  10/16/17 =    Subjective: Pt confused/requires cueing during MBS    Assessment / Plan / Recommendation  CHL IP CLINICAL IMPRESSIONS 01/29/2018  Clinical Impression -Mild oropharyngeal dysphagia characterized by lingual pumping with puree consistency, oral holding prior to swallow with premature spillage to valleculae/pyriform sinuses placing pt at mild-moderate risk for aspiration during intake d/t decreased mentation/efficiency of swallow; flash penetration with larger boluses of thin/nectar  Noted x1, but eliminated with smaller amounts given; mild vallecular residue noted, but eliminated with repetitive swallows and effortful swallow and alternating solids/liquids; potential esophageal component with backflow possibility (difficult to assess d/t pt positioning); pt coughing during intake, but no material appeared to enter airway during MBS; fatigue factor may impact meal tolerance and consumption to maintain nutrition/hydration; pt will continue to be followed by ST for diet tolerance and upgrade when mentation/strength improved; recommend Dysphagia 1/nectar-thick liquids d/t mentation/efficiency of swallow function.  SLP Visit Diagnosis Dysphagia, oropharyngeal phase (R13.12)  Attention and concentration deficit following --  Frontal lobe and executive function deficit following --  Impact on safety and function Mild aspiration risk;Moderate aspiration risk      CHL IP TREATMENT RECOMMENDATION 01/29/2018  Treatment Recommendations Therapy as outlined in treatment plan below     Prognosis 01/29/2018  Prognosis for Safe Diet Advancement Guarded  Barriers to Reach Goals Cognitive deficits;Time post onset;Severity of deficits  Barriers/Prognosis Comment --    CHL IP DIET RECOMMENDATION 01/29/2018  SLP Diet Recommendations Dysphagia 1 (Puree) solids;Nectar  thick liquid;Other (Comment)  Liquid Administration via Cup;Other (Comment);Straw  Medication Administration Crushed with puree   Compensations Minimize environmental distractions;Slow rate;Small sips/bites;Follow solids with liquid;Multiple dry swallows after each bite/sip;Effortful swallow  Postural Changes Seated upright at 90 degrees      CHL IP OTHER RECOMMENDATIONS 01/29/2018  Recommended Consults Consider esophageal assessment  Oral Care Recommendations Oral care BID  Other Recommendations Order thickener from pharmacy      CHL IP FOLLOW UP RECOMMENDATIONS 01/29/2018  Follow up Recommendations 24 hour supervision/assistance;Skilled Nursing facility      [facility] IP FREQUENCY AND DURATION 01/29/2018  Speech Therapy Frequency (ACUTE ONLY) min 1 x/week  Treatment Duration 1 week;2 weeks           CHL IP ORAL PHASE 01/29/2018  Oral Phase Impaired  Oral - Pudding Teaspoon --  Oral - Pudding Cup --  Oral - Honey Teaspoon --  Oral - Honey Cup --  Oral - Nectar Teaspoon --  Oral - Nectar Cup --  Oral - Nectar Straw --  Oral - Thin Teaspoon Premature spillage  Oral - Thin Cup Premature spillage  Oral - Thin Straw Premature spillage  Oral - Puree Lingual pumping;Delayed oral transit  Oral - Mech Soft --  Oral - Regular --  Oral - Multi-Consistency --  Oral - Pill --  Oral Phase - Comment --    CHL IP PHARYNGEAL PHASE 01/29/2018  Pharyngeal Phase Impaired  Pharyngeal- Pudding Teaspoon --  Pharyngeal --  Pharyngeal- Pudding Cup --  Pharyngeal --  Pharyngeal- Honey Teaspoon --  Pharyngeal --  Pharyngeal- Honey Cup --  Pharyngeal --  Pharyngeal- Nectar Teaspoon Delayed swallow initiation-vallecula  Pharyngeal --  Pharyngeal- Nectar Cup --  Pharyngeal --  Pharyngeal- Nectar Straw Delayed swallow initiation-vallecula;Delayed swallow initiation-pyriform sinuses  Pharyngeal --  Pharyngeal- Thin Teaspoon Delayed swallow initiation-pyriform sinuses  Pharyngeal --  Pharyngeal- Thin Cup --  Pharyngeal --  Pharyngeal- Thin Straw Delayed swallow initiation-pyriform sinuses;Penetration/Aspiration during  swallow  Pharyngeal Material enters airway, remains ABOVE vocal cords then ejected out  Pharyngeal- Puree Delayed swallow initiation-vallecula;Pharyngeal residue - valleculae;Compensatory strategies attempted (with notebox);Other (Comment)  Pharyngeal --  Pharyngeal- Mechanical Soft Other (Comment)  Pharyngeal --  Pharyngeal- Regular --  Pharyngeal --  Pharyngeal- Multi-consistency --  Pharyngeal --  Pharyngeal- Pill --  Pharyngeal --  Pharyngeal Comment --     CHL IP CERVICAL ESOPHAGEAL PHASE 01/29/2018  Cervical Esophageal Phase Impaired  Pudding Teaspoon --  Pudding Cup --  Honey Teaspoon --  Honey Cup --  Nectar Teaspoon --  Nectar Cup --  Nectar Straw --  Thin Teaspoon --  Thin Cup --  Thin Straw Other (Comment)  Puree Esophageal backflow into cervical esophagus;Other (Comment)  Mechanical Soft --  Regular --  Multi-consistency --  Pill --  Cervical Esophageal Comment --    Signed on behalf of Elvina Sidle who conducted test on 01/29/2018  Luanna Salk, [place] [facility] SLP Acute Rehab Services Pager [phone] Office [phone]  Macario Golds 01/31/2018, 8:32 AM

## 2018-01-31 NOTE — Care Management Note (Signed)
Case Management Note  Patient Details  Name: Cody Lane MRN: [id number] Date of Birth: 09-30-1940  Subjective/Objective:NSCL.Transfer from [facility] for xrt. xrt simulation done. Today day 1 of 31 xrt tx.PNA-iv abx. Dysphagia 1. From home PT recc SNF-CSW following-has bed. [facility] active w/HHRN/PT.                    Action/Plan:d/c plan SNF   Expected Discharge Date:                  Expected Discharge Plan:  Skilled Nursing Facility  In-House Referral:  Clinical Social Work  Discharge planning Services  CM Consult  Post Acute Care Choice:  Home Health(Active w/[facility] HHRN/PT) Choice offered to:     DME Arranged:    DME Agency:     HH Arranged:    [place] Agency:     Status of Service:  In process, will continue to follow  If discussed at Long Length of Stay Meetings, dates discussed:    Additional Comments:  Dessa Phi, RN 01/31/2018, 2:10 PM

## 2018-01-31 NOTE — Progress Notes (Signed)
Patient is refusing medications, taking off his oxygen, is agitated and somewhat paranoid and has refused to eat all day.  Patient is alert and oriented x1.  Wife at bedside for most of the day.  Patient refused radiation this am, but after talking to his wife for awhile he tolerated going downstairs for radiation.

## 2018-01-31 NOTE — Plan of Care (Signed)
  Problem: Elimination: Goal: Will not experience complications related to bowel motility Outcome: Progressing Goal: Will not experience complications related to urinary retention Outcome: Progressing   Problem: Skin Integrity: Goal: Risk for impaired skin integrity will decrease Outcome: Progressing

## 2018-01-31 NOTE — Progress Notes (Addendum)
PROGRESS NOTE    Cody Lane  NOB:[id number] DOB: 01-08-1941 DOA: 01/20/2018 PCP: [name]  Brief Narrative:78 year old male with a history of emphysematous COPD, systolic CHF, atrial fibrillation, recent CVA who is admitted for acute hypoxic respiratory failure. 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 He was in the process of initiating chemotherapy through the [place] system but this was deferred because he became hypoxemic and was hospitalized on these occasions. He was recently discharged and readmitted to [facility] acute hypoxic respiratory failure secondary to acute on chronic systolic CHF, knownlung cancer, chronic COPD, and community-acquired pneumonia. He completed a full course of antibiotics for CAP and has been diuresed for pulmonary edema. He underwent a thoracentesis with 900 cc of fluid removed. 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 Despite removal of fluid he continues to have increased supplemental oxygen requirements. Pulmonology and radiation oncology both consulted and recommended transfer to [facility] for palliative radiation. He will undergo stimulation here prior to transfer.  His hospital course has been complicated by by hypotension, persistent atrial fibrillation with RVR, intermittent confusion, difficulty swallowing, and subacute stroke (please see significant events below  Significant Events: 01/25/2018 Head HU:[id number] with small vessel chronic ischemic changes of deep cerebral white matter. Age-indeterminate lacunar infarct RIGHT basal ganglia, new since 10/15/2017. Old LEFT thalamic lacunar infarct. No intracranial hemorrhage identified. 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 01/26/2018 CXR: 1. No definite residual left pleural effusion status post thoracentesis. No pneumothorax. 2. Unchanged interstitial pulmonary edema and left lower lobe opacity which could reflect alveolar edema or pneumonia. 01/27/2018 MRI Brain: 1. Small acute/early subacute infarctions within the bilateral posterior frontal lobe centrum semiovale. No hemorrhage or mass effect. Few probable additional scattered punctate foci of infarction, partially obscured by motion artifact. Distribution favors watershed infarction. 2. Stable moderate to severe chronic microvascular ischemic changes and moderate volume loss of the brain. 3. Stable foci of chronic microhemorrhage, likely related to chronic hypertension. 4. No intracranial metastasis identified.  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 (HCC) Active Problems:   Essential hypertension   COPD (chronic obstructive pulmonary disease) with emphysema (HCC)   Chronic respiratory failure with hypoxia, on home O2 therapy (HCC)   Acute on chronic systolic heart failure (HCC)   Physical deconditioning   CAP (community acquired pneumonia)   Malnutrition of moderate degree   Goals of care, counseling/discussion   Palliative care by specialist   Paroxysmal atrial fibrillation (HCC)   Pleural effusion   S/P thoracentesis  #1 acute hypoxic respiratory failure secondary to COPD exacerbation, acute on chronic systolic CHF, non-small cell lung cancer. He is still dependent on oxygen. Patient was seen by pulmonary and XRT at [facility] and recommended transfer to [facility] long for palliative radiation to decrease his lung cancer with possible improvement in his respiratory status.  2 history of paroxysmal atrial fibrillation with RVR patient's heart rate remained elevated above over 100. Continue 3 times a day metoprolol. Continue Eliquis.  #3  hypertension patient is soft blood pressure continue all medications except metoprolol as he needs metoprolol for A. fib with RVR.  #4 AKI stable  #5 hypernatremic patient is getting more hypernatremic will change fluids to half-normal saline.  #  6 non-small cell lung cancer-patient transferred to [facility] from [facility] for palliative radiation.  Oncology planning for patient chemotherapy.    Nutrition Problem: Moderate Malnutrition Etiology: chronic illness(CHF, COPD, lung cancer)     Signs/Symptoms: energy intake < or equal to 75% for > or equal to 1 month, mild fat depletion, moderate fat depletion, mild muscle depletion, moderate muscle depletion, percent weight loss Percent weight loss: 9.9 %    Interventions: Magic cup, Ensure Enlive (each supplement provides 350kcal and 20 grams of protein), MVI  Estimated body mass index is 21.38 kg/m as calculated from the following:   Height as of this encounter: 6' (1.829 m).   Weight as of this encounter: 71.5 kg.  DVT prophylaxis: eliquis Code Status:full Family Communication:none Disposition Plan:  Patient was transferred here from: After being at [facility] for 11 days for palliative XRT Consultants:  Oncology PCCM radiation oncology  Procedures: None Antimicrobials: None  Subjective: Patient is resting in bed he refused medications yesterday he is on a dysphagia diet I explained to him the importance of taking his cardiac medications he has A. fib RVR  Objective: Vitals:   01/30/18 1513 01/30/18 2050 01/31/18 0447 01/31/18 1252  BP: 120/88 (!) 126/96 (!) 108/92 (!) 121/99  Pulse: 93 (!) 129 (!) 112 (!) 123  Resp: 20 16 16 18   Temp: 98.2 F (36.8 C) 98 F (36.7 C) 98.6 F (37 C)   TempSrc: Oral Oral Oral   SpO2:  95% 98% 95%  Weight:   71.5 kg   Height:        Intake/Output Summary (Last 24 hours) at 01/31/2018 1317 Last data filed at 01/31/2018 1254 Gross per 24 hour  Intake 1017.36 ml  Output 725 ml  Net 292.36  ml   Filed Weights   01/29/18 0227 01/30/18 0426 01/31/18 0447  Weight: 71.1 kg 68.8 kg 71.5 kg    Examination:  General exam: Appears calm and comfortable  Respiratory system: Scattered rhonchi to auscultation. Respiratory effort normal. Cardiovascular system: S1 & S2 heard, RRR. No JVD, murmurs, rubs, gallops or clicks. No pedal edema. Gastrointestinal system: Abdomen is nondistended, soft and nontender. No organomegaly or masses felt. Normal bowel sounds heard. Central nervous system: Alert and oriented. No focal neurological deficits. Extremities: Symmetric 5 x 5 power. Skin: No rashes, lesions or ulcers Psychiatry: Judgement and insight appear normal. Mood & affect appropriate.     Data Reviewed: I have personally reviewed following labs and imaging studies  CBC: Recent Labs  Lab 01/25/18 0651 01/26/18 0252 01/27/18 0300 01/30/18 0711  WBC 11.5* 11.3* 10.2 11.8*  NEUTROABS 9.1* 8.3* 7.4  --   HGB 12.2* 12.6* 11.6* 12.0*  HCT 40.8 42.5 38.8* 41.8  MCV 96.7 97.3 97.2 102.5*  PLT 163 143* 135* 631*   Basic Metabolic Panel: Recent Labs  Lab 01/26/18 0252 01/27/18 0300 01/28/18 0214 01/30/18 0711 01/31/18 0358  NA 142 140 143 147* 145  K 4.6 4.6 4.3 4.2 4.3  CL 96* 96* 96* 103 104  CO2 32 36* 36* 35* 32  GLUCOSE 90 101* 113* 103* 110*  BUN 34* 40* 53* 54* 51*  CREATININE 1.64* 1.73* 1.64* 1.47* 1.40*  CALCIUM 9.1 8.9 9.0 9.0 8.9   GFR: Estimated Creatinine Clearance: 44.7 mL/min (A) (by C-G formula based on SCr of 1.4 mg/dL (H)). Liver Function Tests: Recent Labs  Lab 01/25/18 0941  PROT 6.8   No results for input(s): LIPASE, AMYLASE in the last 168 hours. No results for input(s):  AMMONIA in the last 168 hours. Coagulation Profile: No results for input(s): INR, PROTIME in the last 168 hours. Cardiac Enzymes: No results for input(s): CKTOTAL, CKMB, CKMBINDEX, TROPONINI in the last 168 hours. BNP (last 3 results) No results for input(s): PROBNP in the  last 8760 hours. HbA1C: No results for input(s): HGBA1C in the last 72 hours. CBG: No results for input(s): GLUCAP in the last 168 hours. Lipid Profile: No results for input(s): CHOL, HDL, LDLCALC, TRIG, CHOLHDL, LDLDIRECT in the last 72 hours. Thyroid Function Tests: No results for input(s): TSH, T4TOTAL, FREET4, T3FREE, THYROIDAB in the last 72 hours. Anemia Panel: No results for input(s): VITAMINB12, FOLATE, FERRITIN, TIBC, IRON, RETICCTPCT in the last 72 hours. Sepsis Labs: No results for input(s): PROCALCITON, LATICACIDVEN in the last 168 hours.  Recent Results (from the past 240 hour(s))  Gram stain     Status: None   Collection Time: 01/26/18  1:12 PM  Result Value Ref Range Status   Specimen Description FLUID  Final   Special Requests PLEURAL LEFT  Final   Gram Stain   Final    WBC PRESENT,BOTH PMN AND MONONUCLEAR NO ORGANISMS SEEN CYTOSPIN SMEAR Performed at [facility], [address] [place], [place] [address]    Report Status 01/26/2018 FINAL  Final         Radiology Studies: Dg Swallowing Func-speech Pathology  Result Date: 01/29/2018 Objective Swallowing Evaluation: Type of Study: MBS-Modified Barium Swallow Study  Patient Details Name: TARRON KROLAK MRN: [id number] Date of Birth: Jul 08, 1940 Today's Date: 01/29/2018 Time: SLP Start Time (ACUTE ONLY): 1401 -SLP Stop Time (ACUTE ONLY): 1424 SLP Time Calculation (min) (ACUTE ONLY): 23 min Past Medical History: Past Medical History: Diagnosis Date . Aortic valve regurgitation 01/26/14 . Atrial fibrillation ([place])  . 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 CHF (congestive heart failure) ([place]) 01/26/2014 . COPD (chronic obstructive pulmonary disease) ([place])  . CVA (cerebral vascular accident) ([place]) 10/2017  "left side still weak" (01/20/2018) . Emphysema (subcutaneous) (surgical) resulting from a procedure  . Heart murmur  . Hypertension  . 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 Lung cancer ([place]) dx'd 12/2017 . Lung mass  . Mitral valve  regurgitation 01/26/2014 . Myocardial infarction ([place])   "I've had 2; last 1 was ~ 1 wk ago" (01/20/2018) . On home oxygen therapy   "2L; 24/7" (01/20/2018) . Pneumonia   "several times" (01/20/2018) . Pseudophakia 08/17/2013 . 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 Thoracic aortic aneurysm ([place])  . Vitamin D deficiency  Past Surgical History: Past Surgical History: Procedure Laterality Date . AORTIC VALVE REPLACEMENT   . CARDIAC VALVE REPLACEMENT   . CATARACT EXTRACTION W/ INTRAOCULAR LENS  IMPLANT, BILATERAL   . CORONARY ANGIOPLASTY WITH STENT PLACEMENT    "I have 2 stents" (01/20/2018) . INGUINAL HERNIA REPAIR Left  . IR THORACENTESIS ASP PLEURAL SPACE W/IMG GUIDE  01/26/2018 HPI: 78 year old male admitted 01/20/2018 PMH: non-small cell lung cancer, COPD, emphysema, AFib, AVR, aneurysm, CHF, HTN, A&MVRegurg, HLD, recurrent PNA. Recent hospitalization for hypoxia and PNA amd acute metabolic encephalopathy. CXR = interstitial pulmonary edema and left lower lobe opacity which could reflect alveolar edema or pneumonia; BSE completed on 01/28/18 indicating need for MBS d/t overt s/s of aspiration.  Subjective: Pt confused/requires cueing during MBS Assessment / Plan / Recommendation CHL IP CLINICAL IMPRESSIONS 01/29/2018 Clinical Impression Mild oropharyngeal dysphagia characterized by lingual pumping with puree consistency, oral holding prior to swallow with premature spillage to valleculae/pyriform sinuses placing pt at mild-moderate risk for  aspiration during intake d/t decreased mentation/efficiency of swallow; flash penetration with larger boluses of thin/nectar  Noted x1, but eliminated with smaller amounts given; mild vallecular residue noted, but eliminated with repetitive swallows and effortful swallow and alternating solids/liquids; potential esophageal component with backflow possibility (difficult to assess d/t pt positioning); pt coughing during intake, but no material appeared to enter airway during MBS; fatigue factor may  impact meal tolerance and consumption to maintain nutrition/hydration; pt will continue to be followed by ST for diet tolerance and upgrade when mentation/strength improved; recommend Dysphagia 1/nectar-thick liquids d/t mentation/efficiency of swallow function. SLP Visit Diagnosis Dysphagia, oropharyngeal phase (R13.12) Attention and concentration deficit following -- Frontal lobe and executive function deficit following -- Impact on safety and function Mild-moderate aspiration risk.   CHL IP TREATMENT RECOMMENDATION 01/29/2018 Treatment Recommendations Therapy as outlined in treatment plan below   Prognosis 01/29/2018 Prognosis for Safe Diet Advancement Guarded Barriers to Reach Goals Cognitive deficits;Time post onset;Severity of deficits Barriers/Prognosis Comment -- CHL IP DIET RECOMMENDATION 01/29/2018 SLP Diet Recommendations Dysphagia 1 (Puree) solids;Nectar thick liquid;Other (small amounts) Liquid Administration via Cup;Other (Comment);Straw Medication Administration Crushed with puree Compensations Minimize environmental distractions;Slow rate;Small sips/bites;Follow solids with liquid;Multiple dry swallows after each bite/sip;Effortful swallow Postural Changes Seated upright at 90 degrees   CHL IP OTHER RECOMMENDATIONS 01/29/2018 Recommended Consults Consider esophageal assessment Oral Care Recommendations Oral care BID Other Recommendations Order thickener from pharmacy   CHL IP FOLLOW UP RECOMMENDATIONS 01/29/2018 Follow up Recommendations 24 hour supervision/assistance;Skilled Nursing facility   [facility] IP FREQUENCY AND DURATION 01/29/2018 Speech Therapy Frequency (ACUTE ONLY) min 1 x/week Treatment Duration 1 week;2 weeks      CHL IP ORAL PHASE 01/29/2018 Oral Phase Impaired Oral - Pudding Teaspoon -- Oral - Pudding Cup -- Oral - Honey Teaspoon -- Oral - Honey Cup -- Oral - Nectar Teaspoon -- Oral - Nectar Cup -- Oral - Nectar Straw -- Oral - Thin Teaspoon Premature spillage Oral - Thin Cup Premature spillage  Oral - Thin Straw Premature spillage Oral - Puree Lingual pumping;Delayed oral transit Oral - Mech Soft -- Oral - Regular -- Oral - Multi-Consistency -- Oral - Pill -- Oral Phase - Comment --  CHL IP PHARYNGEAL PHASE 01/29/2018 Pharyngeal Phase Impaired Pharyngeal- Pudding Teaspoon -- Pharyngeal -- Pharyngeal- Pudding Cup -- Pharyngeal -- Pharyngeal- Honey Teaspoon -- Pharyngeal -- Pharyngeal- Honey Cup -- Pharyngeal -- Pharyngeal- Nectar Teaspoon Delayed swallow initiation-vallecula Pharyngeal -- Pharyngeal- Nectar Cup -- Pharyngeal -- Pharyngeal- Nectar Straw Delayed swallow initiation-vallecula;Delayed swallow initiation-pyriform sinuses Pharyngeal -- Pharyngeal- Thin Teaspoon Delayed swallow initiation-pyriform sinuses Pharyngeal -- Pharyngeal- Thin Cup -- Pharyngeal -- Pharyngeal- Thin Straw Delayed swallow initiation-pyriform sinuses;Penetration/Aspiration during swallow Pharyngeal Material enters airway, remains ABOVE vocal cords then ejected out Pharyngeal- Puree Delayed swallow initiation-vallecula;Pharyngeal residue - valleculae;Compensatory strategies attempted (with notebox);Other (alternated liquids/solids, effortful swallow, repetitive swallow) Pharyngeal -- Pharyngeal- Mechanical Soft Other (Attempted, but unable to be fully assessed d/t pt non-compliance d/t mentation) Pharyngeal -- Pharyngeal- Regular -- Pharyngeal -- Pharyngeal- Multi-consistency -- Pharyngeal -- Pharyngeal- Pill -- Pharyngeal -- Pharyngeal Comment --  CHL IP CERVICAL ESOPHAGEAL PHASE 01/29/2018 Cervical Esophageal Phase Impaired Pudding Teaspoon -- Pudding Cup -- Honey Teaspoon -- Honey Cup -- Nectar Teaspoon -- Nectar Cup -- Nectar Straw -- Thin Teaspoon -- Thin Cup -- Thin Straw Other (Comment) Puree Esophageal backflow into cervical esophagus;Other (DTA with positioning of pt, but coughing noted intermittently during intake; potential for backflow primarily with larger boluses and timing of swallow initiation) Mechanical Soft --  Regular --  Multi-consistency -- Pill -- Cervical Esophageal Comment -- Elvina Sidle, M.S., CCC-SLP 01/29/2018, 4:58 PM                   Scheduled Meds: . apixaban  5 mg Oral BID  . atorvastatin  40 mg Oral Daily  . feeding supplement (ENSURE ENLIVE)  237 mL Oral BID BM  . feeding supplement (PRO-STAT SUGAR FREE 64)  30 mL Oral TID  . gabapentin  400 mg Oral BID  . loratadine  10 mg Oral Daily  . mouth rinse  15 mL Mouth Rinse BID  . metoprolol tartrate  50 mg Oral TID  . multivitamin with minerals  1 tablet Oral Daily  . umeclidinium bromide  1 puff Inhalation Daily   Continuous Infusions: . sodium chloride 50 mL/hr at 01/31/18 1021     LOS: 11 days     Georgette Shell, MD [facility]  If 7PM-7AM, please contact night-coverage [email or web address] Password [email or web address] 01/31/2018, 1:17 PM

## 2018-01-31 NOTE — Progress Notes (Signed)
  Radiation Oncology         [phone] ________________________________  Name: RAGHAV VERRILLI MRN: [id number]  Date: 01/28/2018  DOB: 1940/06/07  SIMULATION AND TREATMENT PLANNING NOTE  DIAGNOSIS:     ICD-10-CM   1. 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  Identity was confirmed.  All relevant records and images related to the planned course of therapy were reviewed.   Written consent to proceed with treatment was confirmed which was freely given after reviewing the details related to the planned course of therapy had been reviewed with the patient.  Then, the patient was set-up in a stable reproducible  supine position for radiation therapy.  CT images were obtained.  Surface markings were placed.    Medically necessary complex treatment device(s) for immobilization:  Customized vac-lock bag.   The CT images were loaded into the planning software.  Then the target and avoidance structures were contoured.  Treatment planning then occurred.  The radiation prescription was entered and confirmed.  A total of 4 complex treatment devices were fabricated which relate to the designed radiation treatment fields. Additional reduced fields will be used as necessary to improve the dose homogeneity of the plan. Each of these customized fields/ complex treatment devices will be used on a daily basis during the radiation course. I have requested : 3D Simulation  I have requested a DVH of the following structures: target volume, spinal cord, lungs, heart.   The patient will undergo daily image guidance to ensure accurate localization of the target, and adequate minimize dose to the normal surrounding structures in close proximity to the target.   PLAN:  The patient will receive 30 Gy in 10 fractions.    ________________________________   Jodelle Gross, MD, PhD

## 2018-01-31 NOTE — Progress Notes (Signed)
Pt continues to refuse all PO medications. Currently in A Fib at a rate of 120-140's. NP on call, Baltazar Najjar, ordered PRN IV lopressor. He is alert and oriented x1. Medium sized bowel movement with red streaks noted. Will continue to monitor.

## 2018-01-31 NOTE — Progress Notes (Signed)
Pt. Refused all medication. On-call provider made aware. HR still remain between 120 - 140, 150

## 2018-01-31 NOTE — Care Management Important Message (Signed)
Important Message  Patient Details  Name: Cody Lane MRN: [id number] Date of Birth: 08-Jan-1941   Medicare Important Message Given:  Yes    Kerin Salen 01/31/2018, 2:11 [place] Message  Patient Details  Name: Cody Lane MRN: [id number] Date of Birth: [birth year]   Medicare Important Message Given:  Yes    Kerin Salen 01/31/2018, 2:11 PM

## 2018-01-31 NOTE — Progress Notes (Signed)
PT Cancellation Note  Patient Details Name: Cody Lane MRN: [id number] DOB: 16-Nov-1940   Cancelled Treatment:    Reason Eval/Treat Not Completed: Patient at procedure or test/unavailable. Will check back as schedule allows.    Weston Anna, PT Acute Rehabilitation Services Pager: [phone] Office: [phone]

## 2018-02-01 ENCOUNTER — Ambulatory Visit
Admit: 2018-02-01 | Discharge: 2018-02-01 | Disposition: A | Discharge status: Home / Self Care | Payer: Medicare Other | Attending: Radiation Oncology | Admitting: Radiation Oncology

## 2018-02-01 LAB — BLOOD GAS, ARTERIAL
ACID-BASE EXCESS: 7.1 mmol/L — AB (ref 0.0–2.0)
Bicarbonate: 32.2 mmol/L — ABNORMAL HIGH (ref 20.0–28.0)
Drawn by: 441261
O2 Content: 4 L/min
O2 SAT: 93.9 %
PCO2 ART: 49.8 mmHg — AB (ref 32.0–48.0)
PH ART: 7.426 (ref 7.350–7.450)
Patient temperature: 98.6
pO2, Arterial: 74.9 mmHg — ABNORMAL LOW (ref 83.0–108.0)

## 2018-02-01 LAB — BASIC METABOLIC PANEL
Anion gap: 13 (ref 5–15)
BUN: 51 mg/dL — ABNORMAL HIGH (ref 8–23)
CO2: 31 mmol/L (ref 22–32)
Calcium: 9.3 mg/dL (ref 8.9–10.3)
Chloride: 100 mmol/L (ref 98–111)
Creatinine, Ser: 1.44 mg/dL — ABNORMAL HIGH (ref 0.61–1.24)
GFR calc Af Amer: 54 mL/min — ABNORMAL LOW (ref >60)
GFR, EST NON AFRICAN AMERICAN: 47 mL/min — AB (ref >60)
Glucose, Bld: 95 mg/dL (ref 70–99)
Potassium: 4.7 mmol/L (ref 3.5–5.1)
Sodium: 144 mmol/L (ref 135–145)

## 2018-02-01 MED ORDER — HYDROMORPHONE HCL 1 MG/ML IJ SOLN
0.5000 mg | INTRAMUSCULAR | Status: DC | PRN
  Administered 2018-02-05 – 2018-02-06 (×4): 0.5 mg via INTRAVENOUS
  Filled 2018-02-01 (×4): qty 0.5

## 2018-02-01 MED ORDER — METOPROLOL TARTRATE 5 MG/5ML IV SOLN
5.0000 mg | Freq: Four times a day (QID) | INTRAVENOUS | Status: DC | PRN
  Administered 2018-02-01 – 2018-02-02 (×5): 5 mg via INTRAVENOUS
  Filled 2018-02-01 (×6): qty 5

## 2018-02-01 NOTE — Progress Notes (Addendum)
Daily Progress Note   Patient Name: Cody Lane       Date: 02/01/2018 DOB: 1940/09/06  Age: 78 y.o. MRN#: [id number] Attending Physician: Georgette Shell, MD Primary Care Physician: [name] Admit Date: 01/20/2018  Reason for Consultation/Follow-up: Establishing goals of care  Subjective:   Patient is resting in bed He appears weak and fatigued Ate around 5-10% of breakfast Has been refusing to go for radiation at times.  Doesn't verbalize much Wife at bedside.   Length of Stay: 12  Current Medications: Scheduled Meds:  . apixaban  5 mg Oral BID  . atorvastatin  40 mg Oral Daily  . feeding supplement (ENSURE ENLIVE)  237 mL Oral BID BM  . feeding supplement (PRO-STAT SUGAR FREE 64)  30 mL Oral TID  . gabapentin  400 mg Oral BID  . loratadine  10 mg Oral Daily  . mouth rinse  15 mL Mouth Rinse BID  . metoprolol tartrate  50 mg Oral TID  . multivitamin with minerals  1 tablet Oral Daily  . umeclidinium bromide  1 puff Inhalation Daily    Continuous Infusions: . sodium chloride 50 mL/hr at 01/31/18 1021    PRN Meds: acetaminophen **OR** acetaminophen, levalbuterol, ondansetron (ZOFRAN) IV, RESOURCE THICKENUP CLEAR, senna-docusate  Physical Exam         Weak appearing gentleman Diminished breath sound Some abdominal generalized discomfort No edema Awake but not alert, sleepy at times Generalized weakness evident  Vital Signs: BP (!) 120/104 (BP Location: Right Arm)   Pulse (!) 114   Temp 98.6 F (37 C) (Oral)   Resp (!) 22   Ht 6' (1.829 m)   Wt 70 kg   SpO2 91%   BMI 20.93 kg/m  SpO2: SpO2: 91 % O2 Device: O2 Device: Nasal Cannula O2 Flow Rate: O2 Flow Rate (L/min): 4 L/min  Intake/output summary:   Intake/Output Summary (Last 24  hours) at 02/01/2018 1507 Last data filed at 02/01/2018 1306 Gross per 24 hour  Intake 1054.06 ml  Output 400 ml  Net 654.06 ml   LBM: Last BM Date: 01/31/18 Baseline Weight: Weight: 76.2 kg Most recent weight: Weight: 70 kg       Palliative Assessment/Data:    Flowsheet Rows     Most Recent Value  Intake Tab  Referral  Department  Hospitalist  Unit at Time of Referral  Intermediate Care Unit  Palliative Care Primary Diagnosis  Pulmonary  Date Notified  01/20/18  Palliative Care Type  New Palliative care  Reason for referral  Clarify Goals of Care, Psychosocial or Spiritual support  Date of Admission  01/20/18  Date first seen by Palliative Care  01/21/18  # of days Palliative referral response time  1 Day(s)  # of days IP prior to Palliative referral  0  Clinical Assessment  Pain Max last 24 hours  Not able to report  Pain Min Last 24 hours  Not able to report  Dyspnea Max Last 24 Hours  Not able to report  Dyspnea Min Last 24 hours  Not able to report  Nausea Max Last 24 Hours  Not able to report  Nausea Min Last 24 Hours  Not able to report  Anxiety Max Last 24 Hours  Not able to report  Anxiety Min Last 24 Hours  Not able to report  Other Max Last 24 Hours  Not able to report  Psychosocial & Spiritual Assessment  Palliative Care Outcomes  Patient/Family meeting held?  Yes  Who was at the meeting?  pt and wife  Palliative Care follow-up planned  Yes, Facility      Patient Active Problem List   Diagnosis Date Noted  . S/P thoracentesis   . Pleural effusion   . Paroxysmal atrial fibrillation (HCC)   . Acute on chronic systolic heart failure ([place]) 01/21/2018  . Physical deconditioning 01/21/2018  . CAP (community acquired pneumonia) 01/21/2018  . Malnutrition of moderate degree 01/21/2018  . Goals of care, counseling/discussion   . Palliative care by specialist   . Chest pain 01/20/2018  . 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 COPD (chronic obstructive pulmonary disease) with emphysema ([place]) 01/20/2018  . Chronic respiratory failure with hypoxia, on home O2 therapy ([place]) 01/20/2018  . Vertigo 10/15/2017  . Diplopia 10/15/2017  . Cough   . Aortic valve insufficiency S/P aortic valve replacement 03/26/2014  . Kidney mass 03/26/2014  . Essential hypertension 03/26/2014  . Permanent atrial fibrillation 03/26/2014  . Long term current use of anticoagulant therapy 03/26/2014  . Hyperlipidemia 03/26/2014  . CHF (congestive heart failure) ([place]) 03/26/2014  . Insomnia 03/26/2014    Palliative Care Assessment & Plan   Patient Profile:  78 year old male with a history of emphysematous COPD, systolic CHF, atrial fibrillation, recent CVA who is admitted for acute hypoxic respiratory failure. 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 He was in the process of initiating chemotherapy through the [place] system but this was deferred because he became hypoxemic and was hospitalized.   He was recently discharged and readmitted to [facility] acute hypoxic respiratory failure secondary to acute on chronic systolic CHF, knownlung cancer, chronic COPD, and community-acquired pneumonia. He completed a full course of antibiotics for CAP and has been diuresed for pulmonary edema. He underwent a thoracentesis with 900 cc of fluid removed. 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 Despite removal of fluid he continued to have increased supplemental oxygen requirements. Pulmonology and radiation oncology both consulted and recommendeded transfer to [facility] for palliative radiation.   His hospital course has been complicated by by hypotension, persistent atrial fibrillation with RVR, intermittent confusion, difficulty  swallowing, and subacute stroke.  Assessment:  ongoing functional decline Ongoing dysphagia Ongoing cancer related cachexia Patient refusing radiation/other treatments at times Abdominal pain.   Recommendations/Plan:   will add IV dilaudid to be used PRN.  I have initiated discussions with the patient's wife regarding his code status and his overall goals of care. Briefly discussed with her about a more comfort focused approach to his care, and hospice philosophy of care.   For now, continue to attempt radiation treatments, continue to encourage PO and OOB.   PMT to continue to follow along  Spiritual care consult for extra support for wife and family will also be requested.    Goals of Care and Additional Recommendations:  Limitations on Scope of Treatment: Full Scope Treatment  Code Status:    Code Status Orders  (From admission, onward)         Start     Ordered   01/20/18 1429  Full code  Continuous     01/20/18 1428        Code Status History    Date Active Date Inactive Code Status Order ID Comments User Context   10/15/2017 2152 10/18/2017 1947 Full Code [id number]  Rise Patience, MD ED       Prognosis:   Unable to determine  Discharge Planning:  To Be Determined  Care plan was discussed with  Patient's wife, and another family member at the bedside.   Thank you for allowing the Palliative Medicine Team to assist in the care of this patient.   Time In:  10 Time Out:  10.35 Total Time  35 Prolonged Time Billed  no       Greater than 50%  of this time was spent counseling and coordinating care related to the above assessment and plan.  Loistine Chance, MD [phone] Please contact Palliative Medicine Team phone at [phone] for questions and concerns.

## 2018-02-01 NOTE — Plan of Care (Signed)
  Problem: Elimination: Goal: Will not experience complications related to bowel motility Outcome: Progressing Goal: Will not experience complications related to urinary retention Outcome: Progressing   

## 2018-02-01 NOTE — Progress Notes (Signed)
SLP Cancellation Note  Patient Details Name: Cody Lane MRN: [id number] DOB: 12-07-1940   Cancelled treatment:       Reason Eval/Treat Not Completed: Other (comment)(pt at [place] at thist time, Musician informed this SLP that pt was lethargic this am, will follow up next date)   Macario Golds 02/01/2018, 2:57 PM  Luanna Salk, [place] [facility] SLP [place] Pager [phone] Office [phone]

## 2018-02-01 NOTE — Progress Notes (Signed)
Physical Therapy Treatment Patient Details Name: Cody Lane MRN: [id number] DOB: 05-17-1940 Today's Date: 02/01/2018    History of Present Illness  78 year old male with a past medical history significant for non-small cell lung cancer that has been recently diagnosed, COPD with emphysema, chronic congestive heart failure with reduced ejection fraction, A. Fib, history of aortic valve replacement.  As noted he was recently discharged 3 days ago after hospitalization for hypoxia and pneumonia as well as acute metabolic encephalopathy due to those conditions.  At that hospitalization he ended up being discharged on oral antibiotics and with home oxygen therapy.  At home he removed his oxygen to go to the bathroom in the bathroom he developed left-sided chest pain.     PT Comments    Pt was lethargic on today. Limited activity with PT. With assistance, he was able to roll and perform LE exercises. Multimodal cueing required. Pt made very little eye contact. He tended to stare off into space at times. Family was present during session-expressed some concern about pt's current presentation . Continue to recommend SNF.     Follow Up Recommendations  SNF     Equipment Recommendations  (TBD at next venue)    Recommendations for Other Services       Precautions / Restrictions Precautions Precautions: Fall Precaution Comments: watch HR, 02 dependent Restrictions Weight Bearing Restrictions: No    Mobility  Bed Mobility Overal bed mobility: Needs Assistance Bed Mobility: Rolling Rolling: Min assist         General bed mobility comments: Rolling to L and R sides. Multimodal cueing required. Assist to position LEs and turn.   Transfers                 General transfer comment: NT-pt too lethargic and fatigued after minimal bed level activity  Ambulation/Gait                 Stairs             Wheelchair Mobility    Modified Rankin (Stroke Patients  Only)       Balance                                            Cognition Arousal/Alertness: Lethargic Behavior During Therapy: Flat affect Overall Cognitive Status: Impaired/Different from baseline Area of Impairment: Memory;Attention;Following commands;Safety/judgement;Problem solving;Awareness                 Orientation Level: Disoriented to;Place;Time;Situation Current Attention Level: Focused Memory: Decreased short-term memory Following Commands: Follows one step commands inconsistently Safety/Judgement: Decreased awareness of safety;Decreased awareness of deficits Awareness: Intellectual Problem Solving: Slow processing;Difficulty sequencing;Requires verbal cues;Requires tactile cues;Decreased initiation General Comments: pt able to follow approx 25% of one step simple commands this session, requires increased time to do so as pt noted to have delayed processing and response to do; pt sleepy during session and dozing off intermittently, requires cues to arouse but is pleasant and occasionally attempts to be conversive when more awake      Exercises General Exercises - Upper Extremity Shoulder Flexion: Both;AROM;AAROM;5 reps;Supine EGeneral Exercises - Lower Extremity Heel Slides: AROM;AAROM;Both;5 reps;Supine Hip ABduction/ADduction: AAROM;Both;5 reps;Supine    General Comments General comments (skin integrity, edema, etc.): spouse present during session      Pertinent Vitals/Pain Pain Assessment: Faces Faces Pain Scale: No hurt Pain Intervention(s): Monitored during session  Home Living                      Prior Function            PT Goals (current goals can now be found in the care plan section) Acute Rehab PT Goals Patient Stated Goal: to get some sleep Progress towards PT goals: Not progressing toward goals - comment(lethargic and fatigues with min activity)    Frequency    Min 2X/week      PT Plan Current  plan remains appropriate    Co-evaluation              AM-PAC PT "6 Clicks" Mobility   Outcome Measure  Help needed turning from your back to your side while in a flat bed without using bedrails?: A Little Help needed moving from lying on your back to sitting on the side of a flat bed without using bedrails?: A Lot Help needed moving to and from a bed to a chair (including a wheelchair)?: Total Help needed standing up from a chair using your arms (e.g., wheelchair or bedside chair)?: Total Help needed to walk in hospital room?: Total Help needed climbing 3-5 steps with a railing? : Total 6 Click Score: 9    End of Session Equipment Utilized During Treatment: Oxygen Activity Tolerance: Patient limited by fatigue;Patient limited by lethargy Patient left: in bed;with call bell/phone within reach;with bed alarm set;with family/visitor present   PT Visit Diagnosis: Difficulty in walking, not elsewhere classified (R26.2);Muscle weakness (generalized) (M62.81)     Time: 6429-0379 PT Time Calculation (min) (ACUTE ONLY): 22 min  Charges:  $Therapeutic Exercise: 8-22 mins                       Weston Anna, PT Acute Rehabilitation Services Pager: [phone] Office: [phone]

## 2018-02-01 NOTE — Progress Notes (Addendum)
Occupational Therapy Treatment Patient Details Name: Cody Lane MRN: [id number] DOB: 11-Jul-1940 Today's Date: 02/01/2018    History of present illness  78 year old male with a past medical history significant for non-small cell lung cancer that has been recently diagnosed, COPD with emphysema, chronic congestive heart failure with reduced ejection fraction, A. Fib, history of aortic valve replacement.  As noted he was recently discharged 3 days ago after hospitalization for hypoxia and pneumonia as well as acute metabolic encephalopathy due to those conditions.  At that hospitalization he ended up being discharged on oral antibiotics and with home oxygen therapy.  At home he removed his oxygen to go to the bathroom in the bathroom he developed left-sided chest pain.    OT comments  Pt presents supine in bed with family present, initially lethargic but is able to arouse and intermittently engage with therapist this session. Pt following one step simple commands approx 25% of the time this session given increased time and cues to do so. He completed simple grooming ADL task (face washing) with setup and minA to initiate task completion. Completed additional bil UE P/AAROM for continued strengthening, stretching and endurance. Feel POC remains appropriate at this time. Will continue to follow acutely to progress pt towards established OT goals.   Follow Up Recommendations  SNF;Supervision/Assistance - 24 hour    Equipment Recommendations  Other (comment)(TBD in next venue)          Precautions / Restrictions Precautions Precautions: Fall Precaution Comments: watch HR, 02 dependent Restrictions Weight Bearing Restrictions: No       Mobility Bed Mobility                  Transfers                      Balance                                           ADL either performed or assessed with clinical judgement   ADL Overall ADL's : Needs  assistance/impaired     Grooming: Wash/dry face;Minimal assistance;Bed level Grooming Details (indicate cue type and reason): cues to initiate and for thoroughness, requires assist to position washcloth in R hand, pt then able to perform task including washing face and top of head                               General ADL Comments: focus of session on simple grooming ADL and UE P/AAROM     Vision       Perception     Praxis      Cognition Arousal/Alertness: Awake/alert;Lethargic Behavior During Therapy: Flat affect Overall Cognitive Status: Impaired/Different from baseline Area of Impairment: Memory;Attention;Following commands;Safety/judgement;Problem solving;Awareness                   Current Attention Level: Focused Memory: Decreased short-term memory Following Commands: Follows one step commands inconsistently;Follows one step commands with increased time Safety/Judgement: Decreased awareness of safety;Decreased awareness of deficits Awareness: Intellectual Problem Solving: Slow processing;Difficulty sequencing;Requires verbal cues;Requires tactile cues;Decreased initiation General Comments: pt able to follow approx 25% of one step simple commands this session, requires increased time to do so as pt noted to have delayed processing and response to do; pt sleepy during session and dozing off intermittently,  requires cues to arouse but is pleasant and occasionally attempts to be conversive when more awake        Exercises Exercises: General Upper Extremity General Exercises - Upper Extremity Shoulder Flexion: AAROM;PROM;Both;10 reps Elbow Flexion: PROM;AAROM;Both;10 reps;Supine Elbow Extension: PROM;AAROM;Both;Supine Wrist Flexion: PROM;Left;10 reps;Supine Wrist Extension: PROM;Left;10 reps;Supine Digit Composite Flexion: PROM;AAROM;Left;10 reps;Supine Composite Extension: PROM;AAROM;Left;10 reps;Supine   Shoulder Instructions       General  Comments spouse present during session    Pertinent Vitals/ Pain       Pain Assessment: Faces Faces Pain Scale: No hurt Pain Intervention(s): Monitored during session  Home Living                                          Prior Functioning/Environment              Frequency  Min 2X/week        Progress Toward Goals  OT Goals(current goals can now be found in the care plan section)  Progress towards OT goals: Progressing toward goals(slowly)  Acute Rehab OT Goals Patient Stated Goal: to get some sleep OT Goal Formulation: With patient Time For Goal Achievement: 02/07/18 Potential to Achieve Goals: [place] Discharge plan remains appropriate    Co-evaluation                 AM-PAC OT "6 Clicks" Daily Activity     Outcome Measure   Help from another person eating meals?: A Lot Help from another person taking care of personal grooming?: A Lot Help from another person toileting, which includes using toliet, bedpan, or urinal?: Total Help from another person bathing (including washing, rinsing, drying)?: A Lot Help from another person to put on and taking off regular upper body clothing?: A Lot Help from another person to put on and taking off regular lower body clothing?: Total 6 Click Score: 10    End of Session Equipment Utilized During Treatment: Oxygen  OT Visit Diagnosis: Unsteadiness on feet (R26.81);Muscle weakness (generalized) (M62.81)   Activity Tolerance Patient tolerated treatment well;Patient limited by lethargy   Patient Left in bed;with call bell/phone within reach;with bed alarm set;with family/visitor present   Nurse Communication Mobility status        Time: 1135-1200 OT Time Calculation (min): 25 min  Charges: OT General Charges $OT Visit: 1 Visit OT Treatments $Self Care/Home Management : 8-22 mins $Therapeutic Activity: 8-22 mins  Cody Lane, OT Supplemental Rehabilitation Services Pager  [phone] Office [phone]    Cody Lane 02/01/2018, 1:15 PM

## 2018-02-01 NOTE — Progress Notes (Addendum)
PROGRESS NOTE    Cody Lane  LKG:[id number] DOB: 29-Jan-1940 DOA: 01/20/2018 PCP: [name]    Brief Narrative: 78 year old male with a history of emphysematous COPD, systolic CHF, atrial fibrillation, recent CVA who is admitted for acute hypoxic respiratory failure. 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 He was in the process of initiating chemotherapy through the [place] system but this was deferred because he became hypoxemic and was hospitalized on these occasions. He was recently discharged and readmitted to [facility] acute hypoxic respiratory failure secondary to acute on chronic systolic CHF, knownlung cancer, chronic COPD, and community-acquired pneumonia. He completed a full course of antibiotics for CAP and has been diuresed for pulmonary edema. He underwent a thoracentesis with 900 cc of fluid removed. 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 Despite removal of fluid he continues to have increased supplemental oxygen requirements. Pulmonology and radiation oncology both consulted and recommended transfer to [facility] for palliative radiation. He will undergo stimulation here prior to transfer.  His hospital course has been complicated by by hypotension, persistent atrial fibrillation with RVR, intermittent confusion, difficulty swallowing, and subacute stroke (please see significant events below  Significant Events: 01/25/2018 Head [id number] with small vessel chronic ischemic changes of deep cerebral white matter. Age-indeterminate lacunar infarct RIGHT basal ganglia, new since 10/15/2017. Old LEFT thalamic lacunar infarct. No intracranial hemorrhage identified. 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 01/26/2018 CXR: 1. No definite residual left pleural effusion status post thoracentesis. No pneumothorax. 2. Unchanged interstitial pulmonary edema and left lower lobe opacity which could reflect alveolar edema or pneumonia. 01/27/2018 MRI Brain: 1. Small acute/early subacute infarctions within the bilateral posterior frontal lobe centrum semiovale. No hemorrhage or mass effect. Few probable additional scattered punctate foci of infarction, partially obscured by motion artifact. Distribution favors watershed infarction. 2. Stable moderate to severe chronic microvascular ischemic changes and moderate volume loss of the brain. 3. Stable foci of chronic microhemorrhage, likely related to chronic hypertension. 4. No intracranial metastasis identified.  02/01/2018 patient seen at bedside with his wife.  Wife had a lot of concerns and questions which is already been discussed and answered.  1 of the concern was patient refusing to take medications  And he is on a dysphagia diet .  Palliative care consulted today for goals of care. 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 (HCC) Active Problems:   Essential hypertension   COPD (chronic obstructive pulmonary disease) with emphysema (HCC)   Chronic respiratory failure with hypoxia, on home O2 therapy (HCC)   Acute on chronic systolic heart failure (HCC)   Physical deconditioning   CAP (community acquired pneumonia)   Malnutrition of moderate degree   Goals of care, counseling/discussion   Palliative care by specialist   Paroxysmal atrial fibrillation (HCC)   Pleural effusion   S/P thoracentesis  #1 acute hypoxic respiratory failure secondary to COPD exacerbation, acute on chronic systolic CHF, non-small cell lung cancer. He is still dependent on oxygen. Patient was seen by pulmonary and XRT at [facility] and recommended transfer to [facility]  for palliative radiation to decrease his lung cancer with possible improvement in his respiratory status.  XRT was started 01/31/2018.  Dr. Lisbeth Renshaw recommends 2  weeks of radiation therapy.  2 history of paroxysmal atrial fibrillation with RVR patient's heart rate remained elevated above over 100. Continue 3 times a day metoprolol. Continue Eliquis.  Patient often refuses to take medications.  #3 hypertension patient is soft blood pressure continue all medications except metoprolol as he needs metoprolol for A. fib with RVR.  #4 AKI stable  #5 hypernatremic patient is getting more hypernatremic will change fluids to half-normal saline.  #6 non-small cell lung cancer-patient transferred to [facility] from [facility] for palliative radiation.  Oncology planning for out patient chemotherapy.    Nutrition Problem: Moderate Malnutrition Etiology: chronic illness(CHF, COPD, lung cancer)     Signs/Symptoms: energy intake < or equal to 75% for > or equal to 1 month, mild fat depletion, moderate fat depletion, mild muscle depletion, moderate muscle depletion, percent weight loss Percent weight loss: 9.9 %    Interventions: Magic cup, Ensure Enlive (each supplement provides 350kcal and 20 grams of protein), MVI  Estimated body mass index is 20.93 kg/m as calculated from the following:   Height as of this encounter: 6' (1.829 m).   Weight as of this encounter: 70 kg.  DVT prophylaxis: Lovenox Code Status: Full code Family Communication: Discussed at length with his wife at bedside Disposition Plan: Pending clinical progress   Consultants: Oncology XRT PCCM palliative care  Procedures none:  Antimicrobials: None  Subjective: He is resting in bed does not appear to be in any distress but does not want to be woken up or to be fed or disturbed.  Objective: Vitals:   01/31/18 2221 02/01/18 0450 02/01/18 0457 02/01/18 0909  BP: (!) 133/95  117/89 126/89  Pulse: 61  (!) 57 (!) 130    Resp: 20  (!) 22   Temp: 99 F (37.2 C)  99.3 F (37.4 C) 98.4 F (36.9 C)  TempSrc: Axillary  Oral Oral  SpO2: 94%  93% 91%  Weight:  70 kg    Height:        Intake/Output Summary (Last 24 hours) at 02/01/2018 1254 Last data filed at 02/01/2018 0600 Gross per 24 hour  Intake 1054.06 ml  Output 325 ml  Net 729.06 ml   Filed Weights   01/30/18 0426 01/31/18 0447 02/01/18 0450  Weight: 68.8 kg 71.5 kg 70 kg    Examination:  General exam: Respiratory system: Clear to auscultation. Respiratory effort normal. Cardiovascular system: S1 & S2 heard, RRR. No JVD, murmurs, rubs, gallops or clicks. No pedal edema. Gastrointestinal system: Abdomen is nondistended, soft and nontender. No organomegaly or masses felt. Normal bowel sounds heard. Central nervous system: Alert and oriented. No focal neurological deficits. Extremities: Symmetric 5 x 5 power. Skin: No rashes, lesions or ulcers Psychiatry: Judgement and insight appear normal. Mood & affect appropriate.     Data Reviewed: I have personally reviewed following labs and imaging studies  CBC: Recent Labs  Lab 01/26/18 0252 01/27/18 0300 01/30/18 0711 01/31/18 1355  WBC 11.3* 10.2 11.8* 12.3*  NEUTROABS 8.3* 7.4  --   --   HGB 12.6* 11.6* 12.0* 11.5*  HCT 42.5 38.8* 41.8 38.6*  MCV 97.3 97.2 102.5* 98.5  PLT 143* 135* 127* 831*   Basic Metabolic Panel: Recent Labs  Lab 01/28/18 0214 01/30/18 0711 01/31/18 0358 01/31/18 1355 02/01/18 0452  NA 143 147* 145 143 144  K 4.3 4.2 4.3 4.3 4.7  CL 96* 103 104 103 100  CO2 36* 35* 32 32 31  GLUCOSE 113* 103* 110* 109* 95  BUN 53* 54* 51* 53* 51*  CREATININE 1.64* 1.47* 1.40* 1.25* 1.44*  CALCIUM 9.0 9.0 8.9 8.7* 9.3  MG  --   --   --  2.4  --    GFR: Estimated Creatinine Clearance: 42.5 mL/min (A) (by C-G formula based on SCr of 1.44 mg/dL (H)). Liver Function Tests: Recent Labs  Lab 01/31/18 1355  AST 32  ALT 17  ALKPHOS 62  BILITOT 1.3*  PROT 6.7   ALBUMIN 2.7*   No results for input(s): LIPASE, AMYLASE in the last 168 hours. No results for input(s): AMMONIA in the last 168 hours. Coagulation Profile: No results for input(s): INR, PROTIME in the last 168 hours. Cardiac Enzymes: No results for input(s): CKTOTAL, CKMB, CKMBINDEX, TROPONINI in the last 168 hours. BNP (last 3 results) No results for input(s): PROBNP in the last 8760 hours. HbA1C: No results for input(s): HGBA1C in the last 72 hours. CBG: No results for input(s): GLUCAP in the last 168 hours. Lipid Profile: No results for input(s): CHOL, HDL, LDLCALC, TRIG, CHOLHDL, LDLDIRECT in the last 72 hours. Thyroid Function Tests: No results for input(s): TSH, T4TOTAL, FREET4, T3FREE, THYROIDAB in the last 72 hours. Anemia Panel: No results for input(s): VITAMINB12, FOLATE, FERRITIN, TIBC, IRON, RETICCTPCT in the last 72 hours. Sepsis Labs: No results for input(s): PROCALCITON, LATICACIDVEN in the last 168 hours.  Recent Results (from the past 240 hour(s))  Gram stain     Status: None   Collection Time: 01/26/18  1:12 PM  Result Value Ref Range Status   Specimen Description FLUID  Final   Special Requests PLEURAL LEFT  Final   Gram Stain   Final    WBC PRESENT,BOTH PMN AND MONONUCLEAR NO ORGANISMS SEEN CYTOSPIN SMEAR Performed at [facility], [address] [place], [place] [address]    Report Status 01/26/2018 FINAL  Final         Radiology Studies: Dg Chest 1 View  Result Date: 01/31/2018 CLINICAL DATA:  78 year old male with a history of hypoxia EXAM: CHEST  1 VIEW COMPARISON:  01/26/2018, 01/20/2010 FINDINGS: Cardiomediastinal silhouette unchanged in size and contour. Worsening mixed interstitial and airspace disease of the left lung. Partial obscuration of the left hemidiaphragm. Left lung base has been excluded from the exam. No pneumothorax. Redemonstration of emphysematous/bullous changes of the right lung with mild interstitial opacities.  Surgical changes of median sternotomy and aortic valve repair. No displaced fracture. IMPRESSION: Worsening mixed interstitial and airspace disease of the left lung compared to the prior plain film. Left pleural effusion not excluded. Surgical changes of median sternotomy and aortic valve repair. Electronically Signed   By: Corrie Mckusick D.O.   On: 01/31/2018 13:47        Scheduled Meds: . apixaban  5 mg Oral BID  . atorvastatin  40 mg Oral Daily  . feeding supplement (ENSURE ENLIVE)  237 mL Oral BID BM  . feeding supplement (PRO-STAT SUGAR FREE 64)  30 mL Oral TID  . gabapentin  400 mg Oral BID  . loratadine  10 mg Oral Daily  . mouth rinse  15 mL Mouth Rinse BID  . metoprolol tartrate  50 mg Oral TID  . multivitamin with minerals  1 tablet Oral Daily  . umeclidinium bromide  1 puff Inhalation Daily   Continuous Infusions: . sodium chloride 50 mL/hr at 01/31/18 1021     LOS: 12 days     Georgette Shell, MD [facility]  If 7PM-7AM, please contact  night-coverage [email or web address] Password [facility] 02/01/2018, 12:54 PM

## 2018-02-01 NOTE — Progress Notes (Signed)
BP 120/104, 116. Provider updated, no orders received. Will continue to monitor.

## 2018-02-02 ENCOUNTER — Ambulatory Visit
Admit: 2018-02-02 | Discharge: 2018-02-02 | Disposition: A | Discharge status: Home / Self Care | Payer: Medicare Other | Attending: Radiation Oncology | Admitting: Radiation Oncology

## 2018-02-02 DIAGNOSIS — I10 Essential (primary) hypertension: Secondary | ICD-10-CM

## 2018-02-02 DIAGNOSIS — J9611 Chronic respiratory failure with hypoxia: Secondary | ICD-10-CM

## 2018-02-02 DIAGNOSIS — E440 Moderate protein-calorie malnutrition: Secondary | ICD-10-CM

## 2018-02-02 DIAGNOSIS — J189 Pneumonia, unspecified organism: Secondary | ICD-10-CM

## 2018-02-02 DIAGNOSIS — Z9981 Dependence on supplemental oxygen: Secondary | ICD-10-CM

## 2018-02-02 DIAGNOSIS — R5381 Other malaise: Secondary | ICD-10-CM

## 2018-02-02 DIAGNOSIS — I480 Paroxysmal atrial fibrillation: Secondary | ICD-10-CM

## 2018-02-02 LAB — BASIC METABOLIC PANEL
Anion gap: 10 (ref 5–15)
BUN: 53 mg/dL — AB (ref 8–23)
CO2: 29 mmol/L (ref 22–32)
Calcium: 8.9 mg/dL (ref 8.9–10.3)
Chloride: 103 mmol/L (ref 98–111)
Creatinine, Ser: 1.34 mg/dL — ABNORMAL HIGH (ref 0.61–1.24)
GFR calc Af Amer: 59 mL/min — ABNORMAL LOW (ref >60)
GFR calc non Af Amer: 51 mL/min — ABNORMAL LOW (ref >60)
Glucose, Bld: 111 mg/dL — ABNORMAL HIGH (ref 70–99)
Potassium: 4.3 mmol/L (ref 3.5–5.1)
Sodium: 142 mmol/L (ref 135–145)

## 2018-02-02 LAB — CBC
HCT: 40.8 % (ref 39.0–52.0)
Hemoglobin: 12 g/dL — ABNORMAL LOW (ref 13.0–17.0)
MCH: 29.9 pg (ref 26.0–34.0)
MCHC: 29.4 g/dL — ABNORMAL LOW (ref 30.0–36.0)
MCV: 101.5 fL — ABNORMAL HIGH (ref 80.0–100.0)
Platelets: 104 10*3/uL — ABNORMAL LOW (ref 150–400)
RBC: 4.02 MIL/uL — ABNORMAL LOW (ref 4.22–5.81)
RDW: 13.9 % (ref 11.5–15.5)
WBC: 13.7 10*3/uL — ABNORMAL HIGH (ref 4.0–10.5)
nRBC: 0 % (ref 0.0–0.2)

## 2018-02-02 MED ORDER — METOPROLOL TARTRATE 5 MG/5ML IV SOLN
2.5000 mg | Freq: Once | INTRAVENOUS | Status: AC
  Administered 2018-02-02: 2.5 mg via INTRAVENOUS
  Filled 2018-02-02: qty 5

## 2018-02-02 MED ORDER — METOPROLOL TARTRATE 5 MG/5ML IV SOLN
5.0000 mg | INTRAVENOUS | Status: DC | PRN
  Administered 2018-02-03 – 2018-02-04 (×3): 5 mg via INTRAVENOUS
  Filled 2018-02-02 (×4): qty 5

## 2018-02-02 MED ORDER — METOPROLOL SUCCINATE ER 50 MG PO TB24: 50.0000 mg | ORAL_TABLET | Freq: Every day | ORAL | Status: DC

## 2018-02-02 MED ORDER — METOPROLOL TARTRATE 5 MG/5ML IV SOLN
5.0000 mg | Freq: Once | INTRAVENOUS | Status: AC
  Administered 2018-02-02: 5 mg via INTRAVENOUS

## 2018-02-02 NOTE — Progress Notes (Signed)
   02/02/18 1337  Clinical Encounter Type  Visited With Patient and family together  Visit Type Initial;Spiritual support  Referral From Palliative care team  Consult/Referral To Chaplain  The chaplain responded to PMT consult for spiritual care after checking with Pt. RN-Pam.  The RN shared with the chaplain, the Pt. wife has stepped away from the room. When the chaplain returned, the Pt was awake and accompanied by his sister.  In the moments of silence after the introduction, the chaplain heard the Pt. sister's statement sharing the Pt. is not in the best mood today.  The chaplain explained chaplain's can come and go as desired by the Pt.  The chaplain will F/U with spiritual care for  the Pt. and/or family on Friday.

## 2018-02-02 NOTE — Progress Notes (Signed)
Nutrition Follow-up  DOCUMENTATION CODES:   Non-severe (moderate) malnutrition in context of chronic illness  INTERVENTION:  - Continue Ensure Enlive BID and Prostat TID. - Continue to encourage PO intakes.  - If within [place], recommend small bore NGT and initiation of TF. - Recommended TF regimen: 30 ml Prostat BID with Osmolite 1.5@ 11ml/hr to increase by 10 ml every 8hours to goal rate of 101ml/hr.  - Goal rate for TF would provide: 2360 kcal, 120 grams of protein, and 1097 ml free water.   NUTRITION DIAGNOSIS:   Moderate Malnutrition related to chronic illness(CHF, COPD, lung cancer) as evidenced by energy intake < or equal to 75% for > or equal to 1 month, mild fat depletion, moderate fat depletion, mild muscle depletion, moderate muscle depletion, percent weight loss. 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 Fib, history of aortic valve replacement.  As noted he was recently discharged 3 days ago after hospitalization for hypoxia and pneumonia as well as acute metabolic encephalopathy due to those conditions.  At that hospitalization he ended up being discharged on oral antibiotics and with home oxygen therapy.  However this morning he removed his oxygen to go to the bathroom in the bathroom he developed left-sided chest pain and felt that he had a "heart trouble" additionally his wife Cody Lane reports to me that she is very concerned about his weakness which also prompted her to bring him in for evaluation.  Patient last seen by a RD on 01/28/2018. Since that time, patient has been consuming 0-25% of meals, accepting Ensure ~25% of the  time offered, and accepting Prostat ~25% of the time offered. Weight has been fairly stable with a few slight fluctuations over the past 11 days.   Palliative Care following for ongoing Cody Lane discussions. Per Dr. Inda Castle note yesterday afternoon: patient has been refusing radiation treatments at times and plan at this time is to continue to attempt radiation treatments and to continue to encourage PO intakes.   SLP attempted to see patient yesterday morning but he was too lethargic and again attempted in the afternoon at which time patient was at radiation.    Medications reviewed; daily multivitamin with minerals. Labs reviewed; BUN: 53 mg/dl, creatinine: 1.34 mg/dl, GFR: 59 ml/min.  IVF; 1/2 NS @ 50 ml/hr.    Diet Order:   Diet Order            DIET - DYS 1 Room service appropriate? Yes; Fluid consistency: Nectar Thick  Diet effective now              EDUCATION NEEDS:   Education needs have been addressed  Skin:  Skin Assessment: Skin Integrity Issues: Skin Integrity Issues:: Other (Comment) Other: MASD groin  Last BM:  1/21  Height:   Ht Readings from Last 1 Encounters:  01/29/18 6' (1.829 m)    Weight:   Wt Readings from Last 1 Encounters:  02/02/18 72.2 kg    Ideal Body Weight:  83.6 kg  BMI:  Body mass index is 21.58 kg/m.  Estimated Nutritional Needs:   Kcal:  2250-2450  Protein:  115-130 grams  Fluid:  > 2.2 L  Cody Matin, MS, RD, LDN, [facility] Clinical Dietitian Pager # [phone] After hours/weekend pager # [phone]

## 2018-02-02 NOTE — Progress Notes (Signed)
Daily Progress Note   Patient Name: Cody Lane       Date: 02/02/2018 DOB: [birth year]  Age: 78 y.o. MRN#: [id number] Attending Physician: Patrecia Pour, MD Primary Care Physician: [name] Admit Date: 01/20/2018  Reason for Consultation/Follow-up: Establishing goals of care  Subjective:   Patient is resting in bed He appears weak and fatigued  somewhat more alert, drinking some more liquids, no real solid food intake.  Wife at bedside.   Length of Stay: 13  Current Medications: Scheduled Meds:  . apixaban  5 mg Oral BID  . atorvastatin  40 mg Oral Daily  . feeding supplement (ENSURE ENLIVE)  237 mL Oral BID BM  . feeding supplement (PRO-STAT SUGAR FREE 64)  30 mL Oral TID  . gabapentin  400 mg Oral BID  . loratadine  10 mg Oral Daily  . mouth rinse  15 mL Mouth Rinse BID  . metoprolol tartrate  50 mg Oral TID  . multivitamin with minerals  1 tablet Oral Daily  . umeclidinium bromide  1 puff Inhalation Daily    Continuous Infusions: . sodium chloride 50 mL/hr at 01/31/18 1021    PRN Meds: acetaminophen **OR** acetaminophen, HYDROmorphone (DILAUDID) injection, levalbuterol, metoprolol tartrate, ondansetron (ZOFRAN) IV, RESOURCE THICKENUP CLEAR, senna-docusate  Physical Exam         Weak appearing gentleman Diminished breath sound No abdominal pain No edema Awake some what alert, sleepy at times Generalized weakness evident  Vital Signs: BP (!) 118/99   Pulse 79   Temp (!) 97.5 F (36.4 C) (Oral)   Resp 16   Ht 6' (1.829 m)   Wt 72.2 kg   SpO2 94%   BMI 21.58 kg/m  SpO2: SpO2: 94 % O2 Device: O2 Device: Nasal Cannula O2 Flow Rate: O2 Flow Rate (L/min): 4 L/min  Intake/output summary:   Intake/Output Summary (Last 24 hours) at 02/02/2018  1209 Last data filed at 02/02/2018 0600 Gross per 24 hour  Intake 600 ml  Output 250 ml  Net 350 ml   LBM: Last BM Date: 02/01/18 Baseline Weight: Weight: 76.2 kg Most recent weight: Weight: 72.2 kg      PPS 30% Palliative Assessment/Data:    Flowsheet Rows     Most Recent Value  Intake Tab  Referral Department  Hospitalist  Unit at Time of Referral  Intermediate Care Unit  Palliative Care Primary Diagnosis  Pulmonary  Date Notified  01/20/18  Palliative Care Type  New Palliative care  Reason for referral  Clarify Goals of Care, Psychosocial or Spiritual support  Date of Admission  01/20/18  Date first seen by Palliative Care  01/21/18  # of days Palliative referral response time  1 Day(s)  # of days IP prior to Palliative referral  0  Clinical Assessment  Pain Max last 24 hours  Not able to report  Pain Min Last 24 hours  Not able to report  Dyspnea Max Last 24 Hours  Not able to report  Dyspnea Min Last 24 hours  Not able to report  Nausea Max Last 24 Hours  Not able to report  Nausea Min Last 24 Hours  Not able to report  Anxiety Max Last 24 Hours  Not able to report  Anxiety Min Last 24 Hours  Not able to report  Other Max Last 24 Hours  Not able to report  Psychosocial & Spiritual Assessment  Palliative Care Outcomes  Patient/Family meeting held?  Yes  Who was at the meeting?  pt and wife  Palliative Care follow-up planned  Yes, Facility      Patient Active Problem List   Diagnosis Date Noted  . S/P thoracentesis   . Pleural effusion   . Paroxysmal atrial fibrillation (HCC)   . Acute on chronic systolic heart failure ([place]) 01/21/2018  . Physical deconditioning 01/21/2018  . CAP (community acquired pneumonia) 01/21/2018  . Malnutrition of moderate degree 01/21/2018  . Goals of care, counseling/discussion   . Palliative care by specialist   . Chest pain 01/20/2018  . Primary malignant neoplasm of bronchus of left lower lobe ([place]) 01/20/2018  . COPD  (chronic obstructive pulmonary disease) with emphysema ([place]) 01/20/2018  . Chronic respiratory failure with hypoxia, on home O2 therapy ([place]) 01/20/2018  . Vertigo 10/15/2017  . Diplopia 10/15/2017  . Cough   . Aortic valve insufficiency S/P aortic valve replacement 03/26/2014  . Kidney mass 03/26/2014  . Essential hypertension 03/26/2014  . Permanent atrial fibrillation 03/26/2014  . Long term current use of anticoagulant therapy 03/26/2014  . Hyperlipidemia 03/26/2014  . CHF (congestive heart failure) ([place]) 03/26/2014  . Insomnia 03/26/2014    Palliative Care Assessment & Plan   Patient Profile:  78 year old male with a history of emphysematous COPD, systolic CHF, atrial fibrillation, recent CVA who is admitted for acute hypoxic respiratory failure. 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 He was in the process of initiating chemotherapy through the [place] system but this was deferred because he became hypoxemic and was hospitalized.   He was recently discharged and readmitted to [facility] acute hypoxic respiratory failure secondary to acute on chronic systolic CHF, knownlung cancer, chronic COPD, and community-acquired pneumonia. He completed a full course of antibiotics for CAP and has been diuresed for pulmonary edema. He underwent a thoracentesis with 900 cc of fluid removed. 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 Despite removal of fluid he continued to have increased supplemental oxygen requirements. Pulmonology and radiation oncology both consulted and recommendeded transfer to [facility] for palliative radiation.   His hospital course has been complicated by by hypotension, persistent atrial fibrillation with RVR, intermittent confusion, difficulty swallowing, and  subacute  stroke.  Assessment:  ongoing functional decline Ongoing dysphagia Ongoing cancer related cachexia Patient refusing radiation/other treatments at times Abdominal pain.   Recommendations/Plan:  continue IV dilaudid to be used PRN.  Discussed with both patient and wife, who is at the bedside about overall goals of care. Code status conversations again undertaken in great detail, including consideration for DNR DNI. Remains full code for now. Wife will discuss further with patient and his 2 sons.    For now, continue to attempt radiation treatments, continue to encourage PO and OOB. Patient's wife states that she will discuss further with Dr Lisbeth Renshaw from radiation oncology regarding his radiation treatments and his overall plan of care soon.   PMT to continue to follow along  Spiritual care consult for extra support for wife and family will also be requested.   Brief life review: patient's wife states that they have known each other for 20 years or so, however, they have been married for about 6 months now. Wife has one daughter and patient has 2 sons, wife to reach out to his sons regarding his current condition.   Goals of Care and Additional Recommendations:  Limitations on Scope of Treatment: Full Scope Treatment  Code Status:    Code Status Orders  (From admission, onward)         Start     Ordered   01/20/18 1429  Full code  Continuous     01/20/18 1428        Code Status History    Date Active Date Inactive Code Status Order ID Comments User Context   10/15/2017 2152 10/18/2017 1947 Full Code [id number]  Rise Patience, MD ED       Prognosis:   Unable to determine  Discharge Planning:  To Be Determined  Care plan was discussed with  Patient's wife,    Thank you for allowing the Palliative Medicine Team to assist in the care of this patient.   Time In:  10 Time Out:  10.35 Total Time  35 Prolonged Time Billed  no       Greater than 50%  of  this time was spent counseling and coordinating care related to the above assessment and plan.  Loistine Chance, MD [phone] Please contact Palliative Medicine Team phone at [phone] for questions and concerns.

## 2018-02-02 NOTE — Progress Notes (Addendum)
PROGRESS NOTE  Cody Lane  ZOX:[id number] DOB: 1940-04-08 DOA: 01/20/2018 PCP: [name]   Brief Narrative: Cody Lane is a 78 year old male with a history of emphysematous COPD, systolic CHF, atrial fibrillation, recent CVA who is admitted for acute hypoxic respiratory failure. 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 He was in the process of initiating chemotherapy through the [place] system but this was deferred because he became hypoxemic and was hospitalized on these occasions. He was recently discharged and readmitted to [facility] acute hypoxic respiratory failure secondary to acute on chronic systolic CHF, knownlung cancer, chronic COPD, and community-acquired pneumonia. He completed a full course of antibiotics for CAP and has been diuresed for pulmonary edema. He underwent a thoracentesis with 900 cc of fluid removed. 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 Despite removal of fluid he continues to have increased supplemental oxygen requirements. Pulmonology and radiation oncology both consulted and recommended transfer to [facility] for palliative radiation.   His hospital course has been complicated by by hypotension, persistent atrial fibrillation with RVR, intermittent confusion, difficulty swallowing, and subacute stroke.  Significant Events: 01/25/2018 Head WJ:XBJYNWG with small vessel chronic ischemic changes of deep cerebral white matter. Age-indeterminate lacunar infarct RIGHT basal ganglia, new since 10/15/2017. Old LEFT thalamic lacunar infarct. No intracranial hemorrhage identified. 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 01/26/2018 CXR: 1.  No definite residual left pleural effusion status post thoracentesis. No pneumothorax. 2. Unchanged interstitial pulmonary edema and left lower lobe opacity which could reflect alveolar edema or pneumonia. 01/27/2018 MRI Brain: 1. Small acute/early subacute infarctions within the bilateral posterior frontal lobe centrum semiovale. No hemorrhage or mass effect. Few probable additional scattered punctate foci of infarction, partially obscured by motion artifact. Distribution favors watershed infarction. 2. Stable moderate to severe chronic microvascular ischemic changes and moderate volume loss of the brain. 3. Stable foci of chronic microhemorrhage, likely related to chronic hypertension. 4. No intracranial metastasis identified.  Assessment & Plan: Principal Problem:   Primary malignant neoplasm of bronchus of left lower lobe (HCC) Active Problems:   Essential hypertension   COPD (chronic obstructive pulmonary disease) with emphysema (HCC)   Chronic respiratory failure with hypoxia, on home O2 therapy (HCC)   Acute on chronic systolic heart failure (HCC)   Physical deconditioning   CAP (community acquired pneumonia)   Malnutrition of moderate degree   Goals of care, counseling/discussion   Palliative care by specialist   Paroxysmal atrial fibrillation (HCC)   Pleural effusion   S/P thoracentesis  Acute hypoxic respiratory failure secondary to COPD exacerbation, acute on chronic systolic CHF, non-small cell lung cancer, malignant pleural effusion: Was also hospitalized at [facility] 12/31 - 1/6 for pneumonia where AFib was difficult to control as well. - Continue oxygen prn. Anticipate ongoing need for this with significance of underlying lung disease - Discussed opioids for dyspnea, though he denies this symptom currently.  Emphysematous/bullous COPD:  - Continue BD's scheduled and prn, no indication for steroids at this time.   Squamous cell NSCLC: Per family, was slated to start  chemotherapy 12/26 and was held due to hypoxia.   - XRT was started 01/31/2018 with plans for 2 weeks per Dr. Lisbeth Renshaw with aim of improving respiratory status.   PAF with RVR:  -  Not consistently taking medications, will order once daily formulation of metoprolol to minimize pill burden. Will hold diltiazem with modestly reduced EF.  - Continue eliquis.   Acute on chronic HFrEF:  - Holding lasix 40mg  po daily home dose with AKI, no peripheral overload noted.  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  - Continue eliquis.  - Restart statin when able or pending goals of care discussions - Continue PT, recommending SNF. - No CES on echocardiogram  Medication nonadherence: This predates this admission per his wife, though it has gotten worse and is actively impacting his care. We discussed the incongruence of wanting full life saving measures in a code situation but refusing medications at this time.  - Given encouragement to take medications, and will continue to seek ways to help with this.   HTN: BP soft - Monitor on medications as above. Holding lisinopril and norvasc  AKI: Resolving  Hypernatremia: Resolved with hypotonic saline  Moderate protein calorie malnutrition:  - Supplement with magic cup, ensure enlive per dietitian recommendations. - Hold MVM to decrease pill burden  Hyperlipidemia:  - Hold statin for now to decrease pill burden  Allergies:  - Hold antihistamine to decrease pill burden  DVT prophylaxis: Lovenox Code Status: Full Family Communication: Wife at bedside Disposition Plan: Uncertain, pending clinical course I suspect he may be declaring himself as a hospice patient.  Consultants:   Oncology  Radiation oncology  Palliative care medicine  Procedures:   None  Antimicrobials:  None   Subjective: Has been consistently refusing medications. Voices to me that he  plans to continue doing so even though they may prolong his life. Denies odynophagia, dysphagia, globus sensation. Denies nausea. No vomiting reported.  Objective: Vitals:   02/02/18 0548 02/02/18 0746 02/02/18 0909 02/02/18 1329  BP: (!) 120/95  (!) 118/99 96/83  Pulse: (!) 103  79 (!) 52  Resp: 16   18  Temp: (!) 97.5 F (36.4 C)     TempSrc: Oral     SpO2: 97% 94%  95%  Weight: 72.2 kg     Height:        Intake/Output Summary (Last 24 hours) at 02/02/2018 1522 Last data filed at 02/02/2018 0600 Gross per 24 hour  Intake 600 ml  Output 175 ml  Net 425 ml   Filed Weights   01/31/18 0447 02/01/18 0450 02/02/18 0548  Weight: 71.5 kg 70 kg 72.2 kg    Gen: Frail 78yo male in no acute distress Pulm: Slightly labored with supplemental oxygen which he takes off. Clear to auscultation bilaterally.  CV: Irreg irreg. No murmur, rub, or gallop. No JVD, no significant pedal edema. GI: Abdomen soft, non-tender, non-distended, with normoactive bowel sounds. No organomegaly or masses felt. Ext: Warm, no deformities Skin: No rashes, lesions or ulcers Neuro: Alert and oriented. No focal neurological deficits. Psych: Judgement and insight appear fair, distant affect, depressed mood.   Data Reviewed: I have personally reviewed following labs and imaging studies  CBC: Recent Labs  Lab 01/27/18 0300 01/30/18 0711 01/31/18 1355 02/02/18 0449  WBC 10.2 11.8* 12.3* 13.7*  NEUTROABS 7.4  --   --   --   HGB 11.6* 12.0* 11.5* 12.0*  HCT 38.8* 41.8 38.6* 40.8  MCV 97.2 102.5* 98.5 101.5*  PLT 135* 127* 130* 371*   Basic Metabolic Panel: Recent Labs  Lab 01/30/18 0711 01/31/18 0358 01/31/18 1355 02/01/18 0452 02/02/18 0449  NA 147* 145 143  144 142  K 4.2 4.3 4.3 4.7 4.3  CL 103 104 103 100 103  CO2 35* 32 32 31 29  GLUCOSE 103* 110* 109* 95 111*  BUN 54* 51* 53* 51* 53*  CREATININE 1.47* 1.40* 1.25* 1.44* 1.34*  CALCIUM 9.0 8.9 8.7* 9.3 8.9  MG  --   --  2.4  --   --     GFR: Estimated Creatinine Clearance: 47.1 mL/min (A) (by C-G formula based on SCr of 1.34 mg/dL (H)). Liver Function Tests: Recent Labs  Lab 01/31/18 1355  AST 32  ALT 17  ALKPHOS 62  BILITOT 1.3*  PROT 6.7  ALBUMIN 2.7*   No results for input(s): LIPASE, AMYLASE in the last 168 hours. No results for input(s): AMMONIA in the last 168 hours. Coagulation Profile: No results for input(s): INR, PROTIME in the last 168 hours. Cardiac Enzymes: No results for input(s): CKTOTAL, CKMB, CKMBINDEX, TROPONINI in the last 168 hours. BNP (last 3 results) No results for input(s): PROBNP in the last 8760 hours. HbA1C: No results for input(s): HGBA1C in the last 72 hours. CBG: No results for input(s): GLUCAP in the last 168 hours. Lipid Profile: No results for input(s): CHOL, HDL, LDLCALC, TRIG, CHOLHDL, LDLDIRECT in the last 72 hours. Thyroid Function Tests: No results for input(s): TSH, T4TOTAL, FREET4, T3FREE, THYROIDAB in the last 72 hours. Anemia Panel: No results for input(s): VITAMINB12, FOLATE, FERRITIN, TIBC, IRON, RETICCTPCT in the last 72 hours. Urine analysis:    Component Value Date/Time   COLORURINE YELLOW 10/15/2017 1850   APPEARANCEUR CLEAR 10/15/2017 1850   LABSPEC 1.032 (H) 10/15/2017 1850   PHURINE 7.0 10/15/2017 1850   GLUCOSEU NEGATIVE 10/15/2017 1850   HGBUR NEGATIVE 10/15/2017 1850   BILIRUBINUR NEGATIVE 10/15/2017 1850   KETONESUR NEGATIVE 10/15/2017 1850   PROTEINUR NEGATIVE 10/15/2017 1850   NITRITE NEGATIVE 10/15/2017 1850   LEUKOCYTESUR NEGATIVE 10/15/2017 1850   Recent Results (from the past 240 hour(s))  Gram stain     Status: None   Collection Time: 01/26/18  1:12 PM  Result Value Ref Range Status   Specimen Description FLUID  Final   Special Requests PLEURAL LEFT  Final   Gram Stain   Final    WBC PRESENT,BOTH PMN AND MONONUCLEAR NO ORGANISMS SEEN CYTOSPIN SMEAR Performed at [facility], [place] [address], [place], [place] [address]     Report Status 01/26/2018 FINAL  Final      Radiology Studies: No results found.  Scheduled Meds: . apixaban  5 mg Oral BID  . atorvastatin  40 mg Oral Daily  . feeding supplement (ENSURE ENLIVE)  237 mL Oral BID BM  . feeding supplement (PRO-STAT SUGAR FREE 64)  30 mL Oral TID  . gabapentin  400 mg Oral BID  . loratadine  10 mg Oral Daily  . mouth rinse  15 mL Mouth Rinse BID  . metoprolol tartrate  50 mg Oral TID  . multivitamin with minerals  1 tablet Oral Daily  . umeclidinium bromide  1 puff Inhalation Daily   Continuous Infusions: . sodium chloride 50 mL/hr at 02/02/18 1442     LOS: 13 days   Time spent: 35 minutes.  Patrecia Pour, MD [facility] [email or web address] Password [facility] 02/02/2018, 3:22 PM

## 2018-02-03 ENCOUNTER — Ambulatory Visit
Admit: 2018-02-03 | Discharge: 2018-02-03 | Disposition: A | Discharge status: Home / Self Care | Payer: Medicare Other | Attending: Radiation Oncology | Admitting: Radiation Oncology

## 2018-02-03 LAB — CBC
HCT: 39.9 % (ref 39.0–52.0)
Hemoglobin: 12 g/dL — ABNORMAL LOW (ref 13.0–17.0)
MCH: 30 pg (ref 26.0–34.0)
MCHC: 30.1 g/dL (ref 30.0–36.0)
MCV: 99.8 fL (ref 80.0–100.0)
Platelets: 108 10*3/uL — ABNORMAL LOW (ref 150–400)
RBC: 4 MIL/uL — ABNORMAL LOW (ref 4.22–5.81)
RDW: 13.9 % (ref 11.5–15.5)
WBC: 13.2 10*3/uL — ABNORMAL HIGH (ref 4.0–10.5)
nRBC: 0 % (ref 0.0–0.2)

## 2018-02-03 LAB — BASIC METABOLIC PANEL
Anion gap: 10 (ref 5–15)
BUN: 57 mg/dL — AB (ref 8–23)
CO2: 28 mmol/L (ref 22–32)
Calcium: 8.7 mg/dL — ABNORMAL LOW (ref 8.9–10.3)
Chloride: 102 mmol/L (ref 98–111)
Creatinine, Ser: 1.34 mg/dL — ABNORMAL HIGH (ref 0.61–1.24)
GFR calc Af Amer: 59 mL/min — ABNORMAL LOW (ref >60)
GFR calc non Af Amer: 51 mL/min — ABNORMAL LOW (ref >60)
Glucose, Bld: 103 mg/dL — ABNORMAL HIGH (ref 70–99)
POTASSIUM: 4.8 mmol/L (ref 3.5–5.1)
Sodium: 140 mmol/L (ref 135–145)

## 2018-02-03 LAB — MAGNESIUM: Magnesium: 2.3 mg/dL (ref 1.7–2.4)

## 2018-02-03 MED ORDER — METOPROLOL TARTRATE 25 MG PO TABS
100.0000 mg | ORAL_TABLET | Freq: Two times a day (BID) | ORAL | Status: DC
  Administered 2018-02-03 – 2018-02-11 (×13): 100 mg via ORAL
  Filled 2018-02-03: qty 2
  Filled 2018-02-03 (×2): qty 4
  Filled 2018-02-03 (×2): qty 2
  Filled 2018-02-03: qty 4
  Filled 2018-02-03 (×4): qty 2
  Filled 2018-02-03: qty 4
  Filled 2018-02-03 (×6): qty 2

## 2018-02-03 MED ORDER — DILTIAZEM HCL 60 MG PO TABS
60.0000 mg | ORAL_TABLET | Freq: Two times a day (BID) | ORAL | Status: DC
  Administered 2018-02-03: 60 mg via ORAL
  Filled 2018-02-03: qty 1

## 2018-02-03 MED ORDER — DILTIAZEM HCL 25 MG/5ML IV SOLN
10.0000 mg | Freq: Once | INTRAVENOUS | Status: AC
  Administered 2018-02-03: 10 mg via INTRAVENOUS
  Filled 2018-02-03: qty 5

## 2018-02-03 MED ORDER — METOPROLOL TARTRATE 5 MG/5ML IV SOLN
2.5000 mg | Freq: Once | INTRAVENOUS | Status: AC
  Administered 2018-02-03: 2.5 mg via INTRAVENOUS

## 2018-02-03 MED ORDER — METOPROLOL TARTRATE 50 MG PO TABS
50.0000 mg | ORAL_TABLET | Freq: Two times a day (BID) | ORAL | Status: DC
  Administered 2018-02-03: 50 mg via ORAL
  Filled 2018-02-03: qty 1

## 2018-02-03 NOTE — Care Management Note (Signed)
Case Management Note  Patient Details  Name: Cody Lane MRN: [id number] Date of Birth: 1940/08/28  Subjective/Objective: Malignant neoplasm bronchus. afib rvr. Ivf, iv metotoprol. xrt-4/31. 02. Palliative following-discussions ongoing.full code.D/c plan SNF-CSW already following.                   Action/Plan:d/c SNF.   Expected Discharge Date:                  Expected Discharge Plan:  Skilled Nursing Facility  In-House Referral:  Clinical Social Work  Discharge planning Services  CM Consult  Post Acute Care Choice:  Home Health(Active w/[facility] HHRN/PT) Choice offered to:     DME Arranged:    DME Agency:     HH Arranged:    [place] Agency:     Status of Service:  In process, will continue to follow  If discussed at Long Length of Stay Meetings, dates discussed:    Additional Comments:  Dessa Phi, RN 02/03/2018, 10:55 AM

## 2018-02-03 NOTE — Progress Notes (Signed)
Pt took eliquis, cardizem, and lopressor crushed in vanilla ice cream this am. Wife refused gabapentin. Jarrius Huaracha, Bing Neighbors, RN

## 2018-02-03 NOTE — Progress Notes (Signed)
  Speech Language Pathology Treatment: Dysphagia  Patient Details Name: Cody Lane MRN: [id number] DOB: Mar 15, 1940 Today's Date: 02/03/2018 Time: 3016-0109 SLP Time Calculation (min) (ACUTE ONLY): 9 min  Assessment / Plan / Recommendation Clinical Impression  Easily woken and cooperative with observation of nectar thick liquids. Four of six trials nectar thick juice were orally transited timely with cup sips; last trial pt held bolus in mouth and needed cues to propel and initiate swallow. No cough, throat clear or wet vocal quality present. Given continued occasional holding, cognitive and fluctuating alertness recommend continue nectar thick liquids for now. Will follow for hopeful upgrade from puree.     HPI HPI: 78 year old male admitted 01/20/2018 PMH: non-small cell lung cancer, COPD, emphysema, AFib, AVR, aneurysm, CHF, HTN, A&MVRegurg, HLD, recurrent PNA. Recent hospitalization for hypoxia and PNA amd acute metabolic encephalopathy. CXR = interstitial pulmonary edema and left lower lobe opacity which could reflect alveolar edema or pneumonia. SLE 10/16/17 =       SLP Plan  Continue with current plan of care       Recommendations  Diet recommendations: Dysphagia 1 (puree);Nectar-thick liquid Liquids provided via: Cup;Straw Medication Administration: Crushed with puree Supervision: Patient able to self feed;Full supervision/cueing for compensatory strategies Compensations: Minimize environmental distractions;Slow rate;Small sips/bites;Follow solids with liquid;Multiple dry swallows after each bite/sip;Effortful swallow Postural Changes and/or Swallow Maneuvers: Seated upright 90 degrees                Oral Care Recommendations: Oral care BID Follow up Recommendations: 24 hour supervision/assistance;Skilled Nursing facility SLP Visit Diagnosis: Dysphagia, oropharyngeal phase (R13.12) Plan: Continue with current plan of care       GO                Houston Siren 02/03/2018, 2:32 PM  Orbie Pyo Colvin Caroli.Ed Risk analyst [phone] Office [phone]

## 2018-02-03 NOTE — Progress Notes (Signed)
PROGRESS NOTE  Cody Lane  JOA:[id number] DOB: 04/26/1940 DOA: 01/20/2018 PCP: [name]   Brief Narrative: Cody Lane is a 78 year old male with a history of emphysematous COPD, systolic CHF, atrial fibrillation, recent CVA who is admitted for acute hypoxic respiratory failure. 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 He was in the process of initiating chemotherapy through the [place] system but this was deferred because he became hypoxemic and was hospitalized on these occasions. He was recently discharged and readmitted to [facility] acute hypoxic respiratory failure secondary to acute on chronic systolic CHF, knownlung cancer, chronic COPD, and community-acquired pneumonia. He completed a full course of antibiotics for CAP and has been diuresed for pulmonary edema. He underwent a thoracentesis with 900 cc of fluid removed. 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 Despite removal of fluid he continues to have increased supplemental oxygen requirements. Pulmonology and radiation oncology both consulted and recommended transfer to [facility] for palliative radiation.   His hospital course has been complicated by by hypotension, persistent atrial fibrillation with RVR, intermittent confusion, difficulty swallowing, and subacute stroke.  Significant Events: 01/25/2018 Head SW:[id number] with small vessel chronic ischemic changes of deep cerebral white matter. Age-indeterminate lacunar infarct RIGHT basal ganglia, new since 10/15/2017. Old LEFT thalamic lacunar infarct. No intracranial hemorrhage identified. 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 01/26/2018 CXR: 1.  No definite residual left pleural effusion status post thoracentesis. No pneumothorax. 2. Unchanged interstitial pulmonary edema and left lower lobe opacity which could reflect alveolar edema or pneumonia. 01/27/2018 MRI Brain: 1. Small acute/early subacute infarctions within the bilateral posterior frontal lobe centrum semiovale. No hemorrhage or mass effect. Few probable additional scattered punctate foci of infarction, partially obscured by motion artifact. Distribution favors watershed infarction. 2. Stable moderate to severe chronic microvascular ischemic changes and moderate volume loss of the brain. 3. Stable foci of chronic microhemorrhage, likely related to chronic hypertension. 4. No intracranial metastasis identified.  Assessment & Plan: Principal Problem:   Primary malignant neoplasm of bronchus of left lower lobe (HCC) Active Problems:   Essential hypertension   COPD (chronic obstructive pulmonary disease) with emphysema (HCC)   Chronic respiratory failure with hypoxia, on home O2 therapy (HCC)   Acute on chronic systolic heart failure (HCC)   Physical deconditioning   CAP (community acquired pneumonia)   Malnutrition of moderate degree   Goals of care, counseling/discussion   Palliative care by specialist   Paroxysmal atrial fibrillation (HCC)   Pleural effusion   S/P thoracentesis  Acute hypoxic respiratory failure secondary to COPD exacerbation, acute on chronic systolic CHF, non-small cell lung cancer, malignant pleural effusion: Was also hospitalized at [facility] 12/31 - 1/6 for pneumonia where AFib was difficult to control as well. - Continue oxygen prn. Anticipate ongoing need for this with significance of underlying lung disease - Discussed opioids for dyspnea, though he denies this symptom currently. - Treat contributing conditions as below.  Emphysematous/bullous COPD:  - Continue BD's scheduled and prn, no indication for steroids at this time.   Squamous  cell NSCLC: Per family, was slated to start chemotherapy 12/26 and was held due to hypoxia.   - XRT was started 01/31/2018 with plans for 2 weeks per Dr. Lisbeth Renshaw with aim of improving respiratory status. Continuing  daily.  PAF with RVR:  - Not consistently taking medications, unable to administer ER formulations as they need to be crushed. Transitioned back to BID dosing of diltiazem and metoprolol. BP may limit further titration but will await results if patient takes medications. will order once daily formulation of metoprolol to minimize pill burden. Will hold diltiazem with modestly reduced EF and increase PM dose of metoprolol.  - Continue eliquis.   Acute on chronic HFrEF:  - Holding lasix 40mg  po daily home dose with AKI and ongoing little po intake, in fact supporting with sub-maintenance IV fluids, no peripheral overload or crackles noted.  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  - Continue eliquis.  - Restart statin when able or pending goals of care discussions - Continue PT, recommending SNF. - No CES on echocardiogram  Medication nonadherence: This predates this admission per his wife, though it has gotten worse and is actively impacting his care. We discussed the incongruence of wanting full life saving measures in a code situation but refusing medications at this time.  - Given encouragement to take medications, and will continue to seek ways to help with this.   HTN: BP soft - Monitor on medications as above. Holding lisinopril, lasix, and norvasc  AKI: Improved.  Hypernatremia: Resolved with hypotonic saline  Moderate protein calorie malnutrition:  - Supplement with magic cup, ensure enlive per dietitian recommendations. - Hold MVM to decrease pill burden  Hyperlipidemia:  - Hold statin for now to decrease pill burden  Allergies:  - Hold antihistamine to decrease pill burden  DVT  prophylaxis: Eliquis Code Status: Full Family Communication: Wife and sister at bedside Disposition Plan: Uncertain, pending clinical course. Will see what effect radiation has for his respiratory status.  Consultants:   Oncology  Radiation oncology  Palliative care medicine  Procedures:   None  Antimicrobials:  None   Subjective: Amenable to taking medications today, family at bedside helpful in this regard. No chest pain and again denies dyspnea. Not eating. Ate a little of a chick-fil-a biscuit this morning, but no chicken.   Objective: Vitals:   02/03/18 0538 02/03/18 0733 02/03/18 0955 02/03/18 1354  BP: (!) 111/99  100/87 (!) 113/94  Pulse: 75   84  Resp: 20   (!) 22  Temp: 97.9 F (36.6 C)   97.8 F (36.6 C)  TempSrc: Axillary   Oral  SpO2: 93% 94%  95%  Weight:      Height:        Intake/Output Summary (Last 24 hours) at 02/03/2018 1955 Last data filed at 02/03/2018 1800 [place] per 24 hour  Intake 1081.83 ml  Output 225 ml  Net 856.83 ml   Filed Weights   02/01/18 0450 02/02/18 0548 02/03/18 0537  Weight: 70 kg 72.2 kg 73.2 kg   Gen: Frail elderly male in no distress Pulm: Labored when speaking several sentences, no crackles or wheezes. CV: Rapid irregular. No murmur, rub, or gallop. No JVD, no dependent edema. GI: Abdomen soft, non-tender, non-distended, with normoactive bowel sounds.  Ext: Warm, no deformities Skin: No rashes, lesions or ulcers on visualized skin. Neuro: Alert and oriented. No focal neurological deficits. Psych: Judgement and insight appear fair. Mood depressed & affect congruent, distant. Behavior is appropriate.    Data Reviewed: I have personally reviewed following labs and imaging studies  CBC: Recent Labs  Lab 01/30/18 0711 01/31/18 1355 02/02/18 0449 02/03/18 0623  WBC 11.8* 12.3* 13.7*  13.2*  HGB 12.0* 11.5* 12.0* 12.0*  HCT 41.8 38.6* 40.8 39.9  MCV 102.5* 98.5 101.5* 99.8  PLT 127* 130* 104* 108*   Basic  Metabolic Panel: Recent Labs  Lab 01/31/18 0358 01/31/18 1355 02/01/18 0452 02/02/18 0449 02/03/18 0623  NA 145 143 144 142 140  K 4.3 4.3 4.7 4.3 4.8  CL 104 103 100 103 102  CO2 32 32 31 29 28   GLUCOSE 110* 109* 95 111* 103*  BUN 51* 53* 51* 53* 57*  CREATININE 1.40* 1.25* 1.44* 1.34* 1.34*  CALCIUM 8.9 8.7* 9.3 8.9 8.7*  MG  --  2.4  --   --  2.3   GFR: Estimated Creatinine Clearance: 47.8 mL/min (A) (by C-G formula based on SCr of 1.34 mg/dL (H)). Liver Function Tests: Recent Labs  Lab 01/31/18 1355  AST 32  ALT 17  ALKPHOS 62  BILITOT 1.3*  PROT 6.7  ALBUMIN 2.7*   No results for input(s): LIPASE, AMYLASE in the last 168 hours. No results for input(s): AMMONIA in the last 168 hours. Coagulation Profile: No results for input(s): INR, PROTIME in the last 168 hours. Cardiac Enzymes: No results for input(s): CKTOTAL, CKMB, CKMBINDEX, TROPONINI in the last 168 hours. BNP (last 3 results) No results for input(s): PROBNP in the last 8760 hours. HbA1C: No results for input(s): HGBA1C in the last 72 hours. CBG: No results for input(s): GLUCAP in the last 168 hours. Lipid Profile: No results for input(s): CHOL, HDL, LDLCALC, TRIG, CHOLHDL, LDLDIRECT in the last 72 hours. Thyroid Function Tests: No results for input(s): TSH, T4TOTAL, FREET4, T3FREE, THYROIDAB in the last 72 hours. Anemia Panel: No results for input(s): VITAMINB12, FOLATE, FERRITIN, TIBC, IRON, RETICCTPCT in the last 72 hours. Urine analysis:    Component Value Date/Time   COLORURINE YELLOW 10/15/2017 1850   APPEARANCEUR CLEAR 10/15/2017 1850   LABSPEC 1.032 (H) 10/15/2017 1850   PHURINE 7.0 10/15/2017 1850   GLUCOSEU NEGATIVE 10/15/2017 1850   HGBUR NEGATIVE 10/15/2017 1850   BILIRUBINUR NEGATIVE 10/15/2017 1850   KETONESUR NEGATIVE 10/15/2017 1850   PROTEINUR NEGATIVE 10/15/2017 1850   NITRITE NEGATIVE 10/15/2017 1850   LEUKOCYTESUR NEGATIVE 10/15/2017 1850   Recent Results (from the past  240 hour(s))  Gram stain     Status: None   Collection Time: 01/26/18  1:12 PM  Result Value Ref Range Status   Specimen Description FLUID  Final   Special Requests PLEURAL LEFT  Final   Gram Stain   Final    WBC PRESENT,BOTH PMN AND MONONUCLEAR NO ORGANISMS SEEN CYTOSPIN SMEAR Performed at [facility], [place] [address], [place], [place] [address]    Report Status 01/26/2018 FINAL  Final      Radiology Studies: No results found.  Scheduled Meds: . apixaban  5 mg Oral BID  . diltiazem  60 mg Oral Q12H  . feeding supplement (ENSURE ENLIVE)  237 mL Oral BID BM  . feeding supplement (PRO-STAT SUGAR FREE 64)  30 mL Oral TID  . gabapentin  400 mg Oral BID  . mouth rinse  15 mL Mouth Rinse BID  . metoprolol tartrate  50 mg Oral BID  . umeclidinium bromide  1 puff Inhalation Daily   Continuous Infusions: . sodium chloride 50 mL/hr at 02/03/18 1137     LOS: 14 days   Time spent: 35 minutes.  Patrecia Pour, MD [facility] [email or web address] Password [facility] 02/03/2018, 7:55 PM

## 2018-02-03 NOTE — Progress Notes (Signed)
PT Cancellation Note  Patient Details Name: Cody Lane MRN: [id number] DOB: [birth year]   Cancelled Treatment:    Reason Eval/Treat Not Completed: Fatigue/lethargy limiting ability to participate Pt returned to room from radiation.  Pt reports feeling fatigued and returns to sleeping.  Will check back as schedule permits.   Saara Kijowski,KATHrine E 02/03/2018, 11:05 AM Carmelia Bake, PT, DPT Acute Rehabilitation Services Office: [phone] Pager: [phone]

## 2018-02-04 ENCOUNTER — Encounter: Payer: Self-pay | Admitting: Internal Medicine

## 2018-02-04 ENCOUNTER — Ambulatory Visit
Admit: 2018-02-04 | Discharge: 2018-02-04 | Disposition: A | Discharge status: Home / Self Care | Payer: Medicare Other | Attending: Radiation Oncology | Admitting: Radiation Oncology

## 2018-02-04 ENCOUNTER — Inpatient Hospital Stay (HOSPITAL_COMMUNITY): Payer: No Typology Code available for payment source

## 2018-02-04 ENCOUNTER — Encounter: Payer: Self-pay | Admitting: *Deleted

## 2018-02-04 ENCOUNTER — Telehealth: Payer: Self-pay | Admitting: Internal Medicine

## 2018-02-04 LAB — BASIC METABOLIC PANEL
Anion gap: 10 (ref 5–15)
BUN: 49 mg/dL — ABNORMAL HIGH (ref 8–23)
CHLORIDE: 103 mmol/L (ref 98–111)
CO2: 27 mmol/L (ref 22–32)
Calcium: 8.7 mg/dL — ABNORMAL LOW (ref 8.9–10.3)
Creatinine, Ser: 1.22 mg/dL (ref 0.61–1.24)
GFR calc Af Amer: >60 mL/min (ref >60)
GFR calc non Af Amer: 57 mL/min — ABNORMAL LOW (ref >60)
Glucose, Bld: 98 mg/dL (ref 70–99)
Potassium: 4.2 mmol/L (ref 3.5–5.1)
Sodium: 140 mmol/L (ref 135–145)

## 2018-02-04 MED ORDER — DILTIAZEM HCL-DEXTROSE 100-5 MG/100ML-% IV SOLN (PREMIX)
5.0000 mg/h | INTRAVENOUS | Status: DC
  Administered 2018-02-04: 5 mg/h via INTRAVENOUS
  Filled 2018-02-04: qty 100

## 2018-02-04 MED ORDER — METOPROLOL TARTRATE 5 MG/5ML IV SOLN
10.0000 mg | Freq: Four times a day (QID) | INTRAVENOUS | Status: DC
  Administered 2018-02-04 – 2018-02-05 (×7): 10 mg via INTRAVENOUS
  Filled 2018-02-04 (×8): qty 10

## 2018-02-04 NOTE — Progress Notes (Signed)
PROGRESS NOTE  Cody Lane  URK:[id number] DOB: [birth year] DOA: 01/20/2018 PCP: [name]   Brief Narrative: Cody Lane is a 78 year old male with a history of emphysematous COPD, systolic CHF, atrial fibrillation, recent CVA who is admitted for acute hypoxic respiratory failure. 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 He was in the process of initiating chemotherapy through the [place] system but this was deferred because he became hypoxemic and was hospitalized on these occasions. He was recently discharged and readmitted to [facility] acute hypoxic respiratory failure secondary to acute on chronic systolic CHF, knownlung cancer, chronic COPD, and community-acquired pneumonia. He completed a full course of antibiotics for CAP and has been diuresed for pulmonary edema. He underwent a thoracentesis with 900 cc of fluid removed. 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 Despite removal of fluid he continues to have increased supplemental oxygen requirements. Pulmonology and radiation oncology both consulted and recommended transfer to [facility] for palliative radiation.   His hospital course has been complicated by by hypotension, persistent atrial fibrillation with RVR, intermittent confusion, difficulty swallowing, and subacute stroke.  Significant Events: 01/25/2018 Head GB:[id number] with small vessel chronic ischemic changes of deep cerebral white matter. Age-indeterminate lacunar infarct RIGHT basal ganglia, new since 10/15/2017. Old LEFT thalamic lacunar infarct. No intracranial hemorrhage identified. 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 01/26/2018 CXR: 1.  No definite residual left pleural effusion status post thoracentesis. No pneumothorax. 2. Unchanged interstitial pulmonary edema and left lower lobe opacity which could reflect alveolar edema or pneumonia. 01/27/2018 MRI Brain: 1. Small acute/early subacute infarctions within the bilateral posterior frontal lobe centrum semiovale. No hemorrhage or mass effect. Few probable additional scattered punctate foci of infarction, partially obscured by motion artifact. Distribution favors watershed infarction. 2. Stable moderate to severe chronic microvascular ischemic changes and moderate volume loss of the brain. 3. Stable foci of chronic microhemorrhage, likely related to chronic hypertension. 4. No intracranial metastasis identified.  Assessment & Plan: Principal Problem:   Primary malignant neoplasm of bronchus of left lower lobe (HCC) Active Problems:   Essential hypertension   COPD (chronic obstructive pulmonary disease) with emphysema (HCC)   Chronic respiratory failure with hypoxia, on home O2 therapy (HCC)   Acute on chronic systolic heart failure (HCC)   Physical deconditioning   CAP (community acquired pneumonia)   Malnutrition of moderate degree   Goals of care, counseling/discussion   Palliative care by specialist   Paroxysmal atrial fibrillation (HCC)   Pleural effusion   S/P thoracentesis  Acute hypoxic respiratory failure secondary to COPD exacerbation, acute on chronic systolic CHF, non-small cell lung cancer, malignant pleural effusion: Was also hospitalized at [facility] 12/31 - 1/6 for pneumonia where AFib was difficult to control as well. - Continue oxygen prn. Anticipate ongoing need for this with significance of underlying lung disease - Discussed opioids for dyspnea, though he denies this symptom currently. - Treat contributing conditions as below.  Emphysematous/bullous COPD:  - Continue BD's scheduled and prn, no indication for steroids at this time.   Squamous  cell NSCLC: Per family, was slated to start chemotherapy 12/26 and was held due to hypoxia.   - XRT was started 01/31/2018 with plans for 2 weeks per Dr. Lisbeth Renshaw with aim of improving respiratory status. Continuing  daily. - Has referral to Dr. Julien Nordmann, establish care appointment 2/3.   PAF with RVR: Uncontrolled. Required diltiazem infusion overnight, still not well controlled.  - Long discussion had with wife and patient. Asked that he decide between taking medications as recommended to prolong his life or to follow a comfort-care-only approach. He agrees to take eliquis and metoprolol. His wife feels he had better success when taking pills whole which will be attempted.  - Continue eliquis and continue metoprolol 100mg  po BID. Plan will be to continue IV metoprolol today and start po with PM dose at which point will stop IV metoprolol.  - Prefer to avoid diltiazem with reduced EF.   Acute on chronic HFrEF:  - Holding lasix 40mg  po daily home dose with AKI and ongoing little po intake, in fact will continue supporting with sub-maintenance IV fluids, no peripheral overload or crackles noted.  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  - Continue eliquis.  - Not giving statin to reduce po burden - Continue PT, recommending SNF. - No CES on echocardiogram  Medication nonadherence: This predates this admission per his wife, though it has gotten worse and is actively impacting his care. We discussed the incongruence of wanting full life saving measures in a code situation but refusing medications at this time.  - Given encouragement to take medications, and will continue to seek ways to help with this.   HTN: BP soft - Monitor on medications as above. Holding lisinopril, lasix, and norvasc  AKI: Improved.  Hypernatremia: Resolved with hypotonic saline  Moderate protein calorie malnutrition:  - Supplement with magic  cup, ensure enlive per dietitian recommendations. - Hold MVM to decrease pill burden  Hyperlipidemia:  - Hold statin for now to decrease pill burden  Allergies:  - Hold antihistamine to decrease pill burden  DVT prophylaxis: Eliquis Code Status: Full Family Communication: Wife at bedside Disposition Plan: Uncertain, planning discharge to SNF. Will see what effect radiation has for his respiratory status.  Consultants:   Oncology  Radiation oncology  Palliative care medicine  Procedures:   None  Antimicrobials:  None   Subjective: Refused medications again last night, had uncontrolled HR, complains of worsening dyspnea this morning. Actually took eliquis, with whole pill not crushed. Prefers this, but will not promise to take medications at this time. HR improved on IV metoprolol.   Objective: Vitals:   02/04/18 0500 02/04/18 0547 02/04/18 0808 02/04/18 1119  BP: 120/80 110/80 (!) 116/93 (!) 122/109  Pulse:   81 63  Resp:   20 20  Temp:    98 F (36.7 C)  TempSrc:    Oral  SpO2:    100%  Weight:      Height:        Intake/Output Summary (Last 24 hours) at 02/04/2018 1428 Last data filed at 02/04/2018 0200 Gross per 24 hour  Intake 1202.19 ml  Output 100 ml  Net 1102.19 ml   Filed Weights   02/01/18 0450 02/02/18 0548 02/03/18 0537  Weight: 70 kg 72.2 kg 73.2 kg   Gen: 78 y.o. male in no distress Pulm: Mildly labored with supplemental oxygen, hovering around 89-92% SpO2. Diffusely diminished. CV: Rapid irregular. No murmur, rub, or gallop. No JVD, no dependent edema. GI: Abdomen soft, non-tender, non-distended, with normoactive bowel sounds.  Ext: Warm, no deformities Skin: No new rashes, lesions or ulcers on visualized skin. Neuro: Alert and oriented. No focal neurological deficits. Psych: Judgement  and insight appear impaired, suspect denial. Irritated.   Data Reviewed: I have personally reviewed following labs and imaging studies  CBC: Recent Labs   Lab 01/30/18 0711 01/31/18 1355 02/02/18 0449 02/03/18 0623  WBC 11.8* 12.3* 13.7* 13.2*  HGB 12.0* 11.5* 12.0* 12.0*  HCT 41.8 38.6* 40.8 39.9  MCV 102.5* 98.5 101.5* 99.8  PLT 127* 130* 104* 680*   Basic Metabolic Panel: Recent Labs  Lab 01/31/18 1355 02/01/18 0452 02/02/18 0449 02/03/18 0623 02/04/18 0539  NA 143 144 142 140 140  K 4.3 4.7 4.3 4.8 4.2  CL 103 100 103 102 103  CO2 32 31 29 28 27   GLUCOSE 109* 95 111* 103* 98  BUN 53* 51* 53* 57* 49*  CREATININE 1.25* 1.44* 1.34* 1.34* 1.22  CALCIUM 8.7* 9.3 8.9 8.7* 8.7*  MG 2.4  --   --  2.3  --    GFR: Estimated Creatinine Clearance: 52.5 mL/min (by C-G formula based on SCr of 1.22 mg/dL). Liver Function Tests: Recent Labs  Lab 01/31/18 1355  AST 32  ALT 17  ALKPHOS 62  BILITOT 1.3*  PROT 6.7  ALBUMIN 2.7*   No results for input(s): LIPASE, AMYLASE in the last 168 hours. No results for input(s): AMMONIA in the last 168 hours. Coagulation Profile: No results for input(s): INR, PROTIME in the last 168 hours. Cardiac Enzymes: No results for input(s): CKTOTAL, CKMB, CKMBINDEX, TROPONINI in the last 168 hours. BNP (last 3 results) No results for input(s): PROBNP in the last 8760 hours. HbA1C: No results for input(s): HGBA1C in the last 72 hours. CBG: No results for input(s): GLUCAP in the last 168 hours. Lipid Profile: No results for input(s): CHOL, HDL, LDLCALC, TRIG, CHOLHDL, LDLDIRECT in the last 72 hours. Thyroid Function Tests: No results for input(s): TSH, T4TOTAL, FREET4, T3FREE, THYROIDAB in the last 72 hours. Anemia Panel: No results for input(s): VITAMINB12, FOLATE, FERRITIN, TIBC, IRON, RETICCTPCT in the last 72 hours. Urine analysis:    Component Value Date/Time   COLORURINE YELLOW 10/15/2017 1850   APPEARANCEUR CLEAR 10/15/2017 1850   LABSPEC 1.032 (H) 10/15/2017 1850   PHURINE 7.0 10/15/2017 1850   GLUCOSEU NEGATIVE 10/15/2017 1850   HGBUR NEGATIVE 10/15/2017 1850   BILIRUBINUR  NEGATIVE 10/15/2017 1850   KETONESUR NEGATIVE 10/15/2017 1850   PROTEINUR NEGATIVE 10/15/2017 1850   NITRITE NEGATIVE 10/15/2017 1850   LEUKOCYTESUR NEGATIVE 10/15/2017 1850   Recent Results (from the past 240 hour(s))  Gram stain     Status: None   Collection Time: 01/26/18  1:12 PM  Result Value Ref Range Status   Specimen Description FLUID  Final   Special Requests PLEURAL LEFT  Final   Gram Stain   Final    WBC PRESENT,BOTH PMN AND MONONUCLEAR NO ORGANISMS SEEN CYTOSPIN SMEAR Performed at [facility], [place] [address] [place], [place] [address]    Report Status 01/26/2018 FINAL  Final      Radiology Studies: Dg Chest Port 1 View  Result Date: 02/04/2018 CLINICAL DATA:  Respiratory distress. EXAM: PORTABLE CHEST 1 VIEW COMPARISON:  01/31/2018. FINDINGS: Prior CABG and cardiac valve replacement. Severe cardiomegaly. Progressive diffuse left lung infiltrate right lung remains clear. Left-sided pleural effusion most likely present. No pneumothorax. No acute bony abnormality. IMPRESSION: 1. Progressive diffuse left lung infiltrate. Left-sided pleural effusion also most likely present. 2. Prior CABG and cardiac valve replacement. Severe cardiomegaly again noted. Electronically Signed   By: Marcello Moores  Register   On: 02/04/2018 07:35  Scheduled Meds: . apixaban  5 mg Oral BID  . feeding supplement (ENSURE ENLIVE)  237 mL Oral BID BM  . feeding supplement (PRO-STAT SUGAR FREE 64)  30 mL Oral TID  . mouth rinse  15 mL Mouth Rinse BID  . metoprolol tartrate  10 mg Intravenous Q6H  . metoprolol tartrate  100 mg Oral BID  . umeclidinium bromide  1 puff Inhalation Daily   Continuous Infusions: . sodium chloride 100 mL/hr at 02/04/18 1152     LOS: 15 days   Time spent: 35 minutes.  Patrecia Pour, MD [facility] [email or web address] Password [email or web address] 02/04/2018, 2:28 PM

## 2018-02-04 NOTE — Progress Notes (Signed)
Oncology Nurse Navigator Documentation  Oncology Nurse Navigator Flowsheets 02/04/2018  Navigator Location [facility]  Referral date to RadOnc/MedOnc 02/04/2018  Navigator Encounter Type Other/I received referral on Cody Lane.  I updated new patient coordinator to call and schedule him with Dr. Julien Nordmann 02/14/2018 with labs.    Treatment Phase Pre-Tx/Tx Discussion  Barriers/Navigation Needs Coordination of Care  Interventions Coordination of Care  Coordination of Care Other  Acuity Level 2  Time Spent with Patient 30

## 2018-02-04 NOTE — Progress Notes (Signed)
   02/04/18 1535  Clinical Encounter Type  Visited With Patient and family together  Visit Type Follow-up;Spiritual support  Referral From Palliative care team  Consult/Referral To Chaplain  The chaplain attempted F/U spiritual care requested by PMT for Pt. and/or Pt. Wife.  The Pt. and Pt. wife were not present at time of visit. RN stated Pt. is receiving radiation.  The chaplain is available for F/U spiritual care as needed.

## 2018-02-04 NOTE — Progress Notes (Signed)
Daily Progress Note   Patient Name: Cody Lane       Date: 02/04/2018 DOB: 1940/06/03  Age: 78 y.o. MRN#: [id number] Attending Physician: Patrecia Pour, MD Primary Care Physician: [name] Admit Date: 01/20/2018  Reason for Consultation/Follow-up: Establishing goals of care  Subjective:   Patient is resting in bed He appears weak and fatigued  somewhat more alert, but appears some what combative Refusing supplemental O2, although he has low oxygen saturations.    Wife at bedside.   Length of Stay: 15  Current Medications: Scheduled Meds:  . apixaban  5 mg Oral BID  . feeding supplement (ENSURE ENLIVE)  237 mL Oral BID BM  . feeding supplement (PRO-STAT SUGAR FREE 64)  30 mL Oral TID  . mouth rinse  15 mL Mouth Rinse BID  . metoprolol tartrate  10 mg Intravenous Q6H  . metoprolol tartrate  100 mg Oral BID  . umeclidinium bromide  1 puff Inhalation Daily    Continuous Infusions: . sodium chloride 100 mL/hr at 02/04/18 1152    PRN Meds: acetaminophen **OR** acetaminophen, HYDROmorphone (DILAUDID) injection, levalbuterol, ondansetron (ZOFRAN) IV, RESOURCE THICKENUP CLEAR, senna-docusate  Physical Exam         Weak appearing gentleman Diminished breath sound No abdominal pain No edema Awake some what alert, sleepy at times Generalized weakness evident  Vital Signs: BP 102/78   Pulse (!) 116   Temp 97.9 F (36.6 C) (Oral)   Resp 18   Ht 6' (1.829 m)   Wt 73.2 kg   SpO2 93%   BMI 21.89 kg/m  SpO2: SpO2: 93 % O2 Device: O2 Device: Nasal Cannula O2 Flow Rate: O2 Flow Rate (L/min): 4 L/min  Intake/output summary:   Intake/Output Summary (Last 24 hours) at 02/04/2018 1651 Last data filed at 02/04/2018 0200 Gross per 24 hour  Intake 852.03 ml  Output  100 ml  Net 752.03 ml   LBM: Last BM Date: 02/04/18 Baseline Weight: Weight: 76.2 kg Most recent weight: Weight: 73.2 kg      PPS 30% Palliative Assessment/Data:    Flowsheet Rows     Most Recent Value  Intake Tab  Referral Department  Hospitalist  Unit at Time of Referral  Intermediate Care Unit  Palliative Care Primary Diagnosis  Pulmonary  Date Notified  01/20/18  Palliative Care Type  New Palliative care  Reason for referral  Clarify Goals of Care, Psychosocial or Spiritual support  Date of Admission  01/20/18  Date first seen by Palliative Care  01/21/18  # of days Palliative referral response time  1 Day(s)  # of days IP prior to Palliative referral  0  Clinical Assessment  Pain Max last 24 hours  Not able to report  Pain Min Last 24 hours  Not able to report  Dyspnea Max Last 24 Hours  Not able to report  Dyspnea Min Last 24 hours  Not able to report  Nausea Max Last 24 Hours  Not able to report  Nausea Min Last 24 Hours  Not able to report  Anxiety Max Last 24 Hours  Not able to report  Anxiety Min Last 24 Hours  Not able to report  Other Max Last 24 Hours  Not able to report  Psychosocial & Spiritual Assessment  Palliative Care Outcomes  Patient/Family meeting held?  Yes  Who was at the meeting?  pt and wife  Palliative Care follow-up planned  Yes, Facility      Patient Active Problem List   Diagnosis Date Noted  . S/P thoracentesis   . Pleural effusion   . Paroxysmal atrial fibrillation (HCC)   . Acute on chronic systolic heart failure ([place]) 01/21/2018  . Physical deconditioning 01/21/2018  . CAP (community acquired pneumonia) 01/21/2018  . Malnutrition of moderate degree 01/21/2018  . Goals of care, counseling/discussion   . Palliative care by specialist   . Chest pain 01/20/2018  . Primary malignant neoplasm of bronchus of left lower lobe ([place]) 01/20/2018  . COPD (chronic obstructive pulmonary disease) with emphysema ([place]) 01/20/2018  . Chronic  respiratory failure with hypoxia, on home O2 therapy ([place]) 01/20/2018  . Vertigo 10/15/2017  . Diplopia 10/15/2017  . Cough   . Aortic valve insufficiency S/P aortic valve replacement 03/26/2014  . Kidney mass 03/26/2014  . Essential hypertension 03/26/2014  . Permanent atrial fibrillation 03/26/2014  . Long term current use of anticoagulant therapy 03/26/2014  . Hyperlipidemia 03/26/2014  . CHF (congestive heart failure) ([place]) 03/26/2014  . Insomnia 03/26/2014    Palliative Care Assessment & Plan   Patient Profile:  78 year old male with a history of emphysematous COPD, systolic CHF, atrial fibrillation, recent CVA who is admitted for acute hypoxic respiratory failure. 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 He was in the process of initiating chemotherapy through the [place] system but this was deferred because he became hypoxemic and was hospitalized.   He was recently discharged and readmitted to [facility] acute hypoxic respiratory failure secondary to acute on chronic systolic CHF, knownlung cancer, chronic COPD, and community-acquired pneumonia. He completed a full course of antibiotics for CAP and has been diuresed for pulmonary edema. He underwent a thoracentesis with 900 cc of fluid removed. 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 Despite removal of fluid he continued to have increased supplemental oxygen requirements. Pulmonology and radiation oncology both consulted and recommendeded transfer to [facility] for palliative radiation.   His hospital course has been complicated by by hypotension, persistent atrial fibrillation with RVR, intermittent confusion, difficulty swallowing, and subacute stroke.  Assessment:  ongoing functional decline Ongoing dysphagia Ongoing  cancer related cachexia Patient refusing radiation/other treatments at times Abdominal pain.  Recommendations/Plan:  continue IV dilaudid to be used PRN.  Discussed with both patient and wife, who is at the bedside about overall goals of care. Code status conversations again undertaken in great detail, including consideration for DNR DNI. Remains full code for now.    For now, continue to attempt radiation treatments, continue to encourage PO and OOB. Patient's wife states that she will discuss further with Dr Lisbeth Renshaw from radiation oncology regarding his radiation treatments and his overall plan of care soon.   PMT to continue to follow along, ongoing efforts to engage with patient and his wife regarding code status and overall goals of care discussions.  Spiritual care consult for extra support for wife and family    Goals of Care and Additional Recommendations:  Limitations on Scope of Treatment: Full Scope Treatment  Code Status:    Code Status Orders  (From admission, onward)         Start     Ordered   01/20/18 1429  Full code  Continuous     01/20/18 1428        Code Status History    Date Active Date Inactive Code Status Order ID Comments User Context   10/15/2017 2152 10/18/2017 1947 Full Code [id number]  Rise Patience, MD ED       Prognosis:   Unable to determine  Discharge Planning:  To Be Determined  Care plan was discussed with  Patient's wife, TRH MD.     Thank you for allowing the Palliative Medicine Team to assist in the care of this patient.   Time In: 1400 Time Out: 1425 Total Time 25 Prolonged Time Billed  no       Greater than 50%  of this time was spent counseling and coordinating care related to the above assessment and plan.  Loistine Chance, MD [phone] Please contact Palliative Medicine Team phone at [phone] for questions and concerns.

## 2018-02-04 NOTE — Progress Notes (Signed)
Clinical Social Worker following patient for support and discharge needs. CSW reach out to [facility] to verify that facility will be able to take patient once medically stable for discharge. CSW spoke with admission coordinator Kirstin and she stated that facility will be able to take patient pending his chemotheraphy treatment schedule. Kirsten stated if patient is needing chemotherapy every week family would have to be responsible for patients transportation to the cancer center and back to rehab facility. CSW will communicate information to family. CSW will continue to follow patient for discharge needs and support.   Rhea Pink, MSW,  [place]

## 2018-02-04 NOTE — Plan of Care (Addendum)
Pt refused scheduled PO 100 mg Metoprolol @ HS. Pt has had 10 Mg Cardizem per 1 X order (per Dr. Silas [place] @ 2252) and 2 doses of 5 Mg Metoprolol Q4 PRN. HR has been as high as 137, but sustained in the hi 120's-low 130's. Pt stated "Me and you is gonna fight if you keep doing that." (scan his bracelet and administer meds through his IV) Pt's HR continues to be in the low to mid 130's. On call contacted again and order rec'd for Cardizem gtt.

## 2018-02-04 NOTE — Progress Notes (Signed)
Call[facility] to obtain [facility] report.  They will fax

## 2018-02-04 NOTE — Progress Notes (Signed)
SLP Cancellation Note  Patient Details Name: LUSTER HECHLER MRN: [id number] DOB: 30-Jan-1940   Cancelled treatment:       Reason Eval/Treat Not Completed: Other (comment)(spoke to RN who reports pt is lethargic, will continue efforts)   Macario Golds 02/04/2018, 5:29 PM  Luanna Salk, [facility] SLP Acute Rehab Services Pager [phone] Office [phone]

## 2018-02-04 NOTE — Telephone Encounter (Signed)
A new patient appt has been scheduled for the pt to see Dr. Julien Nordmann on 2/3 at 3:30pm w/labs at 3pm. Appt date and time has been given to the pt's wife. Letter mailed.

## 2018-02-05 LAB — BASIC METABOLIC PANEL
Anion gap: 10 (ref 5–15)
BUN: 41 mg/dL — ABNORMAL HIGH (ref 8–23)
CO2: 25 mmol/L (ref 22–32)
Calcium: 8.5 mg/dL — ABNORMAL LOW (ref 8.9–10.3)
Chloride: 102 mmol/L (ref 98–111)
Creatinine, Ser: 1.12 mg/dL (ref 0.61–1.24)
GFR calc Af Amer: >60 mL/min (ref >60)
Glucose, Bld: 98 mg/dL (ref 70–99)
Potassium: 4.2 mmol/L (ref 3.5–5.1)
Sodium: 137 mmol/L (ref 135–145)

## 2018-02-05 LAB — CBC
HCT: 40.8 % (ref 39.0–52.0)
Hemoglobin: 12.4 g/dL — ABNORMAL LOW (ref 13.0–17.0)
MCH: 29.8 pg (ref 26.0–34.0)
MCHC: 30.4 g/dL (ref 30.0–36.0)
MCV: 98.1 fL (ref 80.0–100.0)
Platelets: 157 10*3/uL (ref 150–400)
RBC: 4.16 MIL/uL — ABNORMAL LOW (ref 4.22–5.81)
RDW: 13.9 % (ref 11.5–15.5)
WBC: 12.8 10*3/uL — ABNORMAL HIGH (ref 4.0–10.5)
nRBC: 0 % (ref 0.0–0.2)

## 2018-02-05 MED ORDER — LIP MEDEX EX OINT
TOPICAL_OINTMENT | CUTANEOUS | Status: AC
  Administered 2018-02-05: 13:00:00
  Filled 2018-02-05: qty 7

## 2018-02-05 MED ORDER — METOPROLOL TARTRATE 5 MG/5ML IV SOLN
10.0000 mg | Freq: Four times a day (QID) | INTRAVENOUS | Status: DC | PRN
  Administered 2018-02-06 – 2018-02-10 (×4): 10 mg via INTRAVENOUS
  Filled 2018-02-05 (×4): qty 10

## 2018-02-05 NOTE — Progress Notes (Signed)
PROGRESS NOTE  Cody Lane  BJS:[id number] DOB: [birth year] DOA: 01/20/2018 PCP: [name]   Brief Narrative: Cody Lane is a 78 year old male with a history of emphysematous COPD, systolic CHF, atrial fibrillation, recent CVA who is admitted for acute hypoxic respiratory failure. 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 He was in the process of initiating chemotherapy through the [place] system but this was deferred because he became hypoxemic and was hospitalized on these occasions. He was recently discharged and readmitted to [facility] acute hypoxic respiratory failure secondary to acute on chronic systolic CHF, knownlung cancer, chronic COPD, and community-acquired pneumonia. He completed a full course of antibiotics for CAP and has been diuresed for pulmonary edema. He underwent a thoracentesis with 900 cc of fluid removed. 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 Despite removal of fluid he continues to have increased supplemental oxygen requirements. Pulmonology and radiation oncology both consulted and recommended transfer to [facility] for palliative radiation.   His hospital course has been complicated by by hypotension, persistent atrial fibrillation with RVR, intermittent confusion, difficulty swallowing, and subacute stroke.  Significant Events: 01/25/2018 Head [id number] with small vessel chronic ischemic changes of deep cerebral white matter. Age-indeterminate lacunar infarct RIGHT basal ganglia, new since 10/15/2017. Old LEFT thalamic lacunar infarct. No intracranial hemorrhage identified. 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 01/26/2018 CXR: 1.  No definite residual left pleural effusion status post thoracentesis. No pneumothorax. 2. Unchanged interstitial pulmonary edema and left lower lobe opacity which could reflect alveolar edema or pneumonia. 01/27/2018 MRI Brain: 1. Small acute/early subacute infarctions within the bilateral posterior frontal lobe centrum semiovale. No hemorrhage or mass effect. Few probable additional scattered punctate foci of infarction, partially obscured by motion artifact. Distribution favors watershed infarction. 2. Stable moderate to severe chronic microvascular ischemic changes and moderate volume loss of the brain. 3. Stable foci of chronic microhemorrhage, likely related to chronic hypertension. 4. No intracranial metastasis identified.  Assessment & Plan: Principal Problem:   Primary malignant neoplasm of bronchus of left lower lobe (HCC) Active Problems:   Essential hypertension   COPD (chronic obstructive pulmonary disease) with emphysema (HCC)   Chronic respiratory failure with hypoxia, on home O2 therapy (HCC)   Acute on chronic systolic heart failure (HCC)   Physical deconditioning   CAP (community acquired pneumonia)   Malnutrition of moderate degree   Goals of care, counseling/discussion   Palliative care by specialist   Paroxysmal atrial fibrillation (HCC)   Pleural effusion   S/P thoracentesis  Acute hypoxic respiratory failure secondary to COPD exacerbation, acute on chronic systolic CHF, non-small cell lung cancer, malignant pleural effusion: Was also hospitalized at [facility] 12/31 - 1/6 for pneumonia where AFib was difficult to control as well. - Continue oxygen prn. Anticipate ongoing need for this with significance of underlying lung disease - Discussed opioids for dyspnea, though he denies this symptom currently. - Treat contributing conditions as below.  Emphysematous/bullous COPD:  - Continue BD's scheduled and prn, no indication for steroids at this time.   Squamous  cell NSCLC: Per family, was slated to start chemotherapy 12/26 and was held due to hypoxia.   - XRT was started 01/31/2018 with plans for 2 weeks per Dr. Lisbeth Renshaw with aim of improving respiratory status. Continuing  daily M-F. - Has referral to Dr. Julien Nordmann, establish care appointment 2/3.   PAF with RVR: Uncontrolled. Required diltiazem infusion overnight, still not well controlled.  - Long discussion had with wife and patient. Asked that he decide between taking medications as recommended to prolong his life or to follow a comfort-care-only approach. He agrees to take eliquis and metoprolol. His wife feels he had better success when taking pills whole which will be attempted.  - Continue eliquis and continue metoprolol 100mg  po BID. Will make metoprolol IV prn only - Prefer to avoid diltiazem with reduced EF.   Acute on chronic HFrEF:  - Holding lasix 40mg  po daily home dose with AKI and ongoing little po intake, in fact will continue supporting with sub-maintenance IV fluids, no peripheral overload or crackles noted. - Consider restart ACEi if BPs will sustain and po intake improves AND if pt will allow.  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  - Continue eliquis.  - Not giving statin to reduce po burden - Continue PT, recommending SNF. - No CES on echocardiogram  Dysphagia: Reviewed MBS video images with patient and wife at bedside for approximately 15 minutes of the total 35 minute visit today. No gross aspiration noted, though deficits were highlighted predisposing to aspiration.  - Regular diet to encourage po intake. - Must follow strict adjustments to minimize risk of aspiration including small bites, alt fluid/solid, chin tuck, frequent clearance, etc.  Medication nonadherence: This predates this admission per his wife, though it has gotten worse and is actively impacting his care. We discussed the  incongruence of wanting full life saving measures in a code situation but refusing medications at this time.  - Given encouragement to take medications, and will continue to seek ways to help with this.   HTN: BP not elevated. - Monitor on medications as above. Holding lisinopril, lasix, and norvasc  AKI: Improved.  Hypernatremia: Resolved with hypotonic saline  Moderate protein calorie malnutrition:  - Supplement with magic cup, ensure enlive per dietitian recommendations. Will continue to offer this. - Hold MVM to decrease pill burden  Hyperlipidemia:  - Hold statin for now to decrease pill burden  Allergies:  - Hold antihistamine to decrease pill burden  DVT prophylaxis: Eliquis Code Status: Full Family Communication: Wife at bedside Disposition Plan: Uncertain, planning discharge to SNF. If respiratory and function status improve over next few days, may be stable for discharge.  Consultants:   Oncology  Radiation oncology  Palliative care medicine  Procedures:   None  Antimicrobials:  None   Subjective: Took medications more easily when given whole. Is not eating and wife believes it's because of the dysphagia diet. He and she don't care if he aspirates because it appears to be either risk that or he doesn't eat. Requesting regular diet. Denies chest pain or dyspnea.   Objective: Vitals:   02/05/18 0158 02/05/18 0434 02/05/18 0621 02/05/18 1516  BP: (!) 118/96 (!) 126/96 (!) 135/99 (!) 118/100  Pulse: 88 (!) 108 92 92  Resp:  (!) 27 16 (!) 24  Temp:  97.9 F (36.6 C) 97.8 F (36.6 C) 98 F (36.7 C)  TempSrc:  Oral Oral Axillary  SpO2:  97% 93% 92%  Weight:      Height:        Intake/Output Summary (Last 24 hours) at 02/05/2018 1907 Last data filed at 02/05/2018 1755 Gross per 24 hour  Intake 1115.08 ml  Output  25 ml  Net 1090.08 ml   Filed Weights   02/01/18 0450 02/02/18 0548 02/03/18 0537  Weight: 70 kg 72.2 kg 73.2 kg   Gen: 78 y.o. male in  no distress Pulm: Mildly labored tachypnea still with supplemental oxygen and diminished diffusely without wheezing or crackles. CV: Irreg irreg, rate in 90's. No murmur, rub, or gallop. No JVD, no dependent edema. GI: Abdomen soft, non-tender, non-distended, with normoactive bowel sounds.  Ext: Warm, no deformities Skin: No new rashes, lesions or ulcers on visualized skin. Neuro: Alert and oriented. No focal neurological deficits. Psych: Judgement and insight appear fair. Mood irritable, incompletely cooperative  Data Reviewed: I have personally reviewed following labs and imaging studies  CBC: Recent Labs  Lab 01/30/18 0711 01/31/18 1355 02/02/18 0449 02/03/18 0623 02/05/18 0540  WBC 11.8* 12.3* 13.7* 13.2* 12.8*  HGB 12.0* 11.5* 12.0* 12.0* 12.4*  HCT 41.8 38.6* 40.8 39.9 40.8  MCV 102.5* 98.5 101.5* 99.8 98.1  PLT 127* 130* 104* 108* 947   Basic Metabolic Panel: Recent Labs  Lab 01/31/18 1355 02/01/18 0452 02/02/18 0449 02/03/18 0623 02/04/18 0539 02/05/18 0540  NA 143 144 142 140 140 137  K 4.3 4.7 4.3 4.8 4.2 4.2  CL 103 100 103 102 103 102  CO2 32 31 29 28 27 25   GLUCOSE 109* 95 111* 103* 98 98  BUN 53* 51* 53* 57* 49* 41*  CREATININE 1.25* 1.44* 1.34* 1.34* 1.22 1.12  CALCIUM 8.7* 9.3 8.9 8.7* 8.7* 8.5*  MG 2.4  --   --  2.3  --   --    GFR: Estimated Creatinine Clearance: 57.2 mL/min (by C-G formula based on SCr of 1.12 mg/dL). Liver Function Tests: Recent Labs  Lab 01/31/18 1355  AST 32  ALT 17  ALKPHOS 62  BILITOT 1.3*  PROT 6.7  ALBUMIN 2.7*   No results for input(s): LIPASE, AMYLASE in the last 168 hours. No results for input(s): AMMONIA in the last 168 hours. Coagulation Profile: No results for input(s): INR, PROTIME in the last 168 hours. Cardiac Enzymes: No results for input(s): CKTOTAL, CKMB, CKMBINDEX, TROPONINI in the last 168 hours. BNP (last 3 results) No results for input(s): PROBNP in the last 8760 hours. HbA1C: No results for  input(s): HGBA1C in the last 72 hours. CBG: No results for input(s): GLUCAP in the last 168 hours. Lipid Profile: No results for input(s): CHOL, HDL, LDLCALC, TRIG, CHOLHDL, LDLDIRECT in the last 72 hours. Thyroid Function Tests: No results for input(s): TSH, T4TOTAL, FREET4, T3FREE, THYROIDAB in the last 72 hours. Anemia Panel: No results for input(s): VITAMINB12, FOLATE, FERRITIN, TIBC, IRON, RETICCTPCT in the last 72 hours. Urine analysis:    Component Value Date/Time   COLORURINE YELLOW 10/15/2017 1850   APPEARANCEUR CLEAR 10/15/2017 1850   LABSPEC 1.032 (H) 10/15/2017 1850   PHURINE 7.0 10/15/2017 1850   GLUCOSEU NEGATIVE 10/15/2017 1850   HGBUR NEGATIVE 10/15/2017 1850   BILIRUBINUR NEGATIVE 10/15/2017 1850   KETONESUR NEGATIVE 10/15/2017 1850   PROTEINUR NEGATIVE 10/15/2017 1850   NITRITE NEGATIVE 10/15/2017 1850   LEUKOCYTESUR NEGATIVE 10/15/2017 1850   No results found for this or any previous visit (from the past 240 hour(s)).    Radiology Studies: Dg Chest Port 1 View  Result Date: 02/04/2018 CLINICAL DATA:  Respiratory distress. EXAM: PORTABLE CHEST 1 VIEW COMPARISON:  01/31/2018. FINDINGS: Prior CABG and cardiac valve replacement. Severe cardiomegaly. Progressive diffuse left lung infiltrate right lung remains clear. Left-sided pleural effusion most likely present. No pneumothorax.  No acute bony abnormality. IMPRESSION: 1. Progressive diffuse left lung infiltrate. Left-sided pleural effusion also most likely present. 2. Prior CABG and cardiac valve replacement. Severe cardiomegaly again noted. Electronically Signed   By: [place]   On: 02/04/2018 07:35    Scheduled Meds: . apixaban  5 mg Oral BID  . feeding supplement (ENSURE ENLIVE)  237 mL Oral BID BM  . feeding supplement (PRO-STAT SUGAR FREE 64)  30 mL Oral TID  . mouth rinse  15 mL Mouth Rinse BID  . metoprolol tartrate  10 mg Intravenous Q6H  . metoprolol tartrate  100 mg Oral BID  . umeclidinium  bromide  1 puff Inhalation Daily   Continuous Infusions: . sodium chloride 100 mL/hr at 02/05/18 0641     LOS: 16 days   Time spent: 35 minutes.  Patrecia Pour, MD [facility] [email or web address] Password [facility] 02/05/2018, 7:07 PM

## 2018-02-06 LAB — BASIC METABOLIC PANEL
ANION GAP: 10 (ref 5–15)
BUN: 47 mg/dL — ABNORMAL HIGH (ref 8–23)
CO2: 26 mmol/L (ref 22–32)
Calcium: 8.7 mg/dL — ABNORMAL LOW (ref 8.9–10.3)
Chloride: 101 mmol/L (ref 98–111)
Creatinine, Ser: 1.29 mg/dL — ABNORMAL HIGH (ref 0.61–1.24)
GFR calc Af Amer: >60 mL/min (ref >60)
GFR calc non Af Amer: 53 mL/min — ABNORMAL LOW (ref >60)
Glucose, Bld: 100 mg/dL — ABNORMAL HIGH (ref 70–99)
Potassium: 4.3 mmol/L (ref 3.5–5.1)
Sodium: 137 mmol/L (ref 135–145)

## 2018-02-06 MED ORDER — DILTIAZEM HCL 25 MG/5ML IV SOLN
10.0000 mg | Freq: Once | INTRAVENOUS | Status: AC
  Administered 2018-02-07: 10 mg via INTRAVENOUS
  Filled 2018-02-06: qty 5

## 2018-02-06 MED ORDER — HYDROMORPHONE HCL 1 MG/ML IJ SOLN
0.2500 mg | INTRAMUSCULAR | Status: DC | PRN
  Administered 2018-02-09 – 2018-02-11 (×3): 0.25 mg via INTRAVENOUS
  Filled 2018-02-06 (×3): qty 1

## 2018-02-06 NOTE — Progress Notes (Addendum)
PROGRESS NOTE  Cody Lane  ONG:[id number] DOB: 09-23-1940 DOA: 01/20/2018 PCP: [name]   Brief Narrative: Cody Lane is a 78 year old male with a history of emphysematous COPD, systolic CHF, atrial fibrillation, recent CVA who is admitted for acute hypoxic respiratory failure. 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 He was in the process of initiating chemotherapy through the [place] system but this was deferred because he became hypoxemic and was hospitalized on these occasions. He was recently discharged and readmitted to [facility] acute hypoxic respiratory failure secondary to acute on chronic systolic CHF, knownlung cancer, chronic COPD, and community-acquired pneumonia. He completed a full course of antibiotics for CAP and has been diuresed for pulmonary edema. He underwent a thoracentesis with 900 cc of fluid removed. 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 Despite removal of fluid he continues to have increased supplemental oxygen requirements. Pulmonology and radiation oncology both consulted and recommended transfer to [facility] for palliative radiation.   His hospital course has been complicated by by hypotension, persistent atrial fibrillation with RVR, intermittent confusion, difficulty swallowing, and subacute stroke.  Significant Events: 01/25/2018 Head GM:[id number] with small vessel chronic ischemic changes of deep cerebral white matter. Age-indeterminate lacunar infarct RIGHT basal ganglia, new since 10/15/2017. Old LEFT thalamic lacunar infarct. No intracranial hemorrhage identified. 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 01/26/2018 CXR: 1.  No definite residual left pleural effusion status post thoracentesis. No pneumothorax. 2. Unchanged interstitial pulmonary edema and left lower lobe opacity which could reflect alveolar edema or pneumonia. 01/27/2018 MRI Brain: 1. Small acute/early subacute infarctions within the bilateral posterior frontal lobe centrum semiovale. No hemorrhage or mass effect. Few probable additional scattered punctate foci of infarction, partially obscured by motion artifact. Distribution favors watershed infarction. 2. Stable moderate to severe chronic microvascular ischemic changes and moderate volume loss of the brain. 3. Stable foci of chronic microhemorrhage, likely related to chronic hypertension. 4. No intracranial metastasis identified.  Assessment & Plan: Principal Problem:   Primary malignant neoplasm of bronchus of left lower lobe (HCC) Active Problems:   Essential hypertension   COPD (chronic obstructive pulmonary disease) with emphysema (HCC)   Chronic respiratory failure with hypoxia, on home O2 therapy (HCC)   Acute on chronic systolic heart failure (HCC)   Physical deconditioning   CAP (community acquired pneumonia)   Malnutrition of moderate degree   Goals of care, counseling/discussion   Palliative care by specialist   Paroxysmal atrial fibrillation (HCC)   Pleural effusion   S/P thoracentesis  Acute hypoxic respiratory failure secondary to COPD exacerbation, acute on chronic systolic CHF, non-small cell lung cancer, malignant pleural effusion: Was also hospitalized at [facility] 12/31 - 1/6 for pneumonia where AFib was difficult to control as well. - Continue oxygen prn. Anticipate ongoing need for this with significance of underlying lung disease - Discussed opioids for dyspnea, though he denies this symptom currently. - Treat contributing conditions as below.  Abdominal pain: Not affecting po intake, no tenderness, distention or abnormal sounds on exam.  - Monitor - With  leukocytosis, will trend and culture/CT abd if fevers.  Acute toxic metabolic encephalopathy: Suspect due to narcotic administration.  - Decrease dilaudid dose significantly.  - Delirium precautions  Emphysematous/bullous COPD:  - Continue BD's scheduled and prn,  no indication for steroids at this time.   Squamous cell NSCLC: Per family, was slated to start chemotherapy 12/26 and was held due to hypoxia.   - XRT was started 01/31/2018 with plans for 2 weeks per Dr. Lisbeth Renshaw with aim of improving respiratory status. Continuing daily M-F. - Has referral to Dr. Julien Nordmann, establish care appointment 2/3.   PAF with RVR: Uncontrolled. Required diltiazem infusion overnight, still not well controlled.  - Long discussion had with wife and patient. Asked that he decide between taking medications as recommended to prolong his life or to follow a comfort-care-only approach. He agrees to take eliquis and metoprolol. His wife feels he had better success when taking pills whole which will be attempted.  - Continue eliquis and continue metoprolol 100mg  po BID. Will make metoprolol IV prn only - Prefer to avoid diltiazem with reduced EF.   Acute on chronic HFrEF:  - Holding lasix 40mg  po daily home dose with AKI and ongoing little po intake, in fact will continue supporting with sub-maintenance IV fluids, no peripheral overload or crackles noted. - Consider restart ACEi if BPs will sustain and po intake improves AND if pt will allow.  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  - Continue eliquis.  - Not giving statin to reduce po burden - Continue PT, recommending SNF. - No CES on echocardiogram  Dysphagia: Reviewed MBS video images with patient and wife at bedside. - Regular diet to encourage po intake. - Must follow strict adjustments to minimize risk of aspiration including small bites, alt fluid/solid, chin tuck, frequent  clearance, etc.  Medication nonadherence: This predates this admission per his wife, though it has gotten worse and is actively impacting his care. We discussed the incongruence of wanting full life saving measures in a code situation but refusing medications at this time.  - Given encouragement to take medications, and will continue to seek ways to help with this.   HTN: BP not elevated. - Monitor on medications as above. Holding lisinopril, lasix, and norvasc  AKI: Improved.  Hypernatremia: Resolved with hypotonic saline  Moderate protein calorie malnutrition:  - Supplement with magic cup, ensure enlive per dietitian recommendations. Will continue to offer this. - Hold MVM to decrease pill burden  Hyperlipidemia:  - Hold statin for now to decrease pill burden  Allergies:  - Hold antihistamine to decrease pill burden  DVT prophylaxis: Eliquis Code Status: Full Family Communication: None at bedside Disposition Plan: Uncertain, planning discharge to SNF once stabilized. Consultants:   Oncology  Radiation oncology  Palliative care medicine  Procedures:   None  Antimicrobials:  None   Subjective: Complained of generalized abdominal pain this morning, was given dilaudid and has been lethargic since. Took medications previously and HR better. No other events overnight. Pt without complaints.  Objective: Vitals:   02/06/18 0450 02/06/18 0629 02/06/18 0935 02/06/18 1327  BP:    (!) 140/99  Pulse: (!) 110  68 76  Resp: (!) 22   20  Temp:   98 F (36.7 C) 97.6 F (36.4 C)  TempSrc:   Oral Oral  SpO2: 95%  94% 94%  Weight:  75.2 kg    Height:        Intake/Output Summary (Last 24 hours) at 02/06/2018 1845 Last data filed at 02/05/2018 2300 Gross per 24 hour  Intake 2233.66 ml  Output 200 ml  Net 2033.66 ml   Filed Weights   02/02/18 0548 02/03/18  7209 02/06/18 0629  Weight: 72.2 kg 73.2 kg 75.2 kg   Gen: 78 y.o. male in no distress, lethargic,  rousable Pulm: Nonlabored breathing supplemental oxygen with mouth wide open. No wheezing. CV: Irreg, rate in 90's. No murmur, rub, or gallop. No JVD, no dependent edema. GI: Abdomen soft, not tender, not distended, with normoactive bowel sounds.  Ext: Warm, no deformities Skin: No rashes, lesions or ulcers on visualized skin. Neuro: Lethargic, follows some commands, no focal neurological deficits though pt not completely cooperative. Psych: Judgement and insight appear impaired.   Data Reviewed: I have personally reviewed following labs and imaging studies  CBC: Recent Labs  Lab 01/31/18 1355 02/02/18 0449 02/03/18 0623 02/05/18 0540  WBC 12.3* 13.7* 13.2* 12.8*  HGB 11.5* 12.0* 12.0* 12.4*  HCT 38.6* 40.8 39.9 40.8  MCV 98.5 101.5* 99.8 98.1  PLT 130* 104* 108* 470   Basic Metabolic Panel: Recent Labs  Lab 01/31/18 1355  02/02/18 0449 02/03/18 0623 02/04/18 0539 02/05/18 0540 02/06/18 0523  NA 143   < > 142 140 140 137 137  K 4.3   < > 4.3 4.8 4.2 4.2 4.3  CL 103   < > 103 102 103 102 101  CO2 32   < > 29 28 27 25 26   GLUCOSE 109*   < > 111* 103* 98 98 100*  BUN 53*   < > 53* 57* 49* 41* 47*  CREATININE 1.25*   < > 1.34* 1.34* 1.22 1.12 1.29*  CALCIUM 8.7*   < > 8.9 8.7* 8.7* 8.5* 8.7*  MG 2.4  --   --  2.3  --   --   --    < > = values in this interval not displayed.   GFR: Estimated Creatinine Clearance: 51 mL/min (A) (by C-G formula based on SCr of 1.29 mg/dL (H)). Liver Function Tests: Recent Labs  Lab 01/31/18 1355  AST 32  ALT 17  ALKPHOS 62  BILITOT 1.3*  PROT 6.7  ALBUMIN 2.7*   No results for input(s): LIPASE, AMYLASE in the last 168 hours. No results for input(s): AMMONIA in the last 168 hours. Coagulation Profile: No results for input(s): INR, PROTIME in the last 168 hours. Cardiac Enzymes: No results for input(s): CKTOTAL, CKMB, CKMBINDEX, TROPONINI in the last 168 hours. BNP (last 3 results) No results for input(s): PROBNP in the last  8760 hours. HbA1C: No results for input(s): HGBA1C in the last 72 hours. CBG: No results for input(s): GLUCAP in the last 168 hours. Lipid Profile: No results for input(s): CHOL, HDL, LDLCALC, TRIG, CHOLHDL, LDLDIRECT in the last 72 hours. Thyroid Function Tests: No results for input(s): TSH, T4TOTAL, FREET4, T3FREE, THYROIDAB in the last 72 hours. Anemia Panel: No results for input(s): VITAMINB12, FOLATE, FERRITIN, TIBC, IRON, RETICCTPCT in the last 72 hours. Urine analysis:    Component Value Date/Time   COLORURINE YELLOW 10/15/2017 1850   APPEARANCEUR CLEAR 10/15/2017 1850   LABSPEC 1.032 (H) 10/15/2017 1850   PHURINE 7.0 10/15/2017 1850   GLUCOSEU NEGATIVE 10/15/2017 1850   HGBUR NEGATIVE 10/15/2017 1850   BILIRUBINUR NEGATIVE 10/15/2017 1850   KETONESUR NEGATIVE 10/15/2017 1850   PROTEINUR NEGATIVE 10/15/2017 1850   NITRITE NEGATIVE 10/15/2017 1850   LEUKOCYTESUR NEGATIVE 10/15/2017 1850   No results found for this or any previous visit (from the past 240 hour(s)).    Radiology Studies: No results found.  Scheduled Meds: . apixaban  5 mg Oral BID  . feeding supplement (ENSURE ENLIVE)  237 mL Oral BID BM  . feeding supplement (PRO-STAT SUGAR FREE 64)  30 mL Oral TID  . mouth rinse  15 mL Mouth Rinse BID  . metoprolol tartrate  100 mg Oral BID  . umeclidinium bromide  1 puff Inhalation Daily   Continuous Infusions: . sodium chloride 100 mL/hr at 02/06/18 1041     LOS: 17 days   Time spent: 35 minutes.  Patrecia Pour, MD [facility] [email or web address] Password [facility] 02/06/2018, 6:45 PM

## 2018-02-06 NOTE — Progress Notes (Signed)
Much improved this morning.  Is talking, cooperating assisting in turn.  Pt has been able to tolerate [place] removing 3/4 of the ostomy appliance from his pubic hair.   Will continue to work on it's full removal during day shift.  No skin breakdown noted in that area.  Area cleaned and barrier cream applied.

## 2018-02-07 ENCOUNTER — Ambulatory Visit
Admit: 2018-02-07 | Discharge: 2018-02-07 | Disposition: A | Discharge status: Home / Self Care | Payer: Medicare Other | Attending: Radiation Oncology | Admitting: Radiation Oncology

## 2018-02-07 LAB — CBC
HCT: 41.1 % (ref 39.0–52.0)
Hemoglobin: 12.2 g/dL — ABNORMAL LOW (ref 13.0–17.0)
MCH: 29.4 pg (ref 26.0–34.0)
MCHC: 29.7 g/dL — ABNORMAL LOW (ref 30.0–36.0)
MCV: 99 fL (ref 80.0–100.0)
Platelets: 168 10*3/uL (ref 150–400)
RBC: 4.15 MIL/uL — ABNORMAL LOW (ref 4.22–5.81)
RDW: 14 % (ref 11.5–15.5)
WBC: 12.5 10*3/uL — ABNORMAL HIGH (ref 4.0–10.5)
nRBC: 0 % (ref 0.0–0.2)

## 2018-02-07 LAB — BASIC METABOLIC PANEL
Anion gap: 11 (ref 5–15)
BUN: 45 mg/dL — ABNORMAL HIGH (ref 8–23)
CO2: 22 mmol/L (ref 22–32)
Calcium: 8.6 mg/dL — ABNORMAL LOW (ref 8.9–10.3)
Chloride: 102 mmol/L (ref 98–111)
Creatinine, Ser: 1.22 mg/dL (ref 0.61–1.24)
GFR calc Af Amer: >60 mL/min (ref >60)
GFR calc non Af Amer: 57 mL/min — ABNORMAL LOW (ref >60)
Glucose, Bld: 101 mg/dL — ABNORMAL HIGH (ref 70–99)
Potassium: 4.3 mmol/L (ref 3.5–5.1)
Sodium: 135 mmol/L (ref 135–145)

## 2018-02-07 MED ORDER — DILTIAZEM HCL 25 MG/5ML IV SOLN
10.0000 mg | Freq: Once | INTRAVENOUS | Status: AC
  Administered 2018-02-07: 10 mg via INTRAVENOUS
  Filled 2018-02-07: qty 5

## 2018-02-07 MED ORDER — AMIODARONE HCL 200 MG PO TABS
200.0000 mg | ORAL_TABLET | Freq: Two times a day (BID) | ORAL | Status: DC
  Administered 2018-02-07 – 2018-02-11 (×7): 200 mg via ORAL
  Filled 2018-02-07 (×8): qty 1

## 2018-02-07 MED ORDER — DILTIAZEM HCL ER COATED BEADS 180 MG PO CP24
180.0000 mg | ORAL_CAPSULE | Freq: Every day | ORAL | Status: DC
  Administered 2018-02-07 – 2018-02-08 (×2): 180 mg via ORAL
  Filled 2018-02-07 (×2): qty 1

## 2018-02-07 NOTE — Progress Notes (Signed)
PROGRESS NOTE  Cody Lane  UMP:[id number] DOB: [birth year] DOA: 01/20/2018 PCP: [name]   Brief Narrative: DEVERE BREM is a 78 year old male with a history of emphysematous COPD, systolic CHF, atrial fibrillation, recent CVA who is admitted for acute hypoxic respiratory failure. 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 He was in the process of initiating chemotherapy through the [place] system but this was deferred because he became hypoxemic and was hospitalized on these occasions. He was recently discharged and readmitted to [facility] acute hypoxic respiratory failure secondary to acute on chronic systolic CHF, knownlung cancer, chronic COPD, and community-acquired pneumonia. He completed a full course of antibiotics for CAP and has been diuresed for pulmonary edema. He underwent a thoracentesis with 900 cc of fluid removed. 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 Despite removal of fluid he continues to have increased supplemental oxygen requirements. Pulmonology and radiation oncology both consulted and recommended transfer to [facility] for palliative radiation.   His hospital course has been complicated by by hypotension, persistent atrial fibrillation with RVR, intermittent confusion, difficulty swallowing, and subacute stroke.  Significant Events: 01/25/2018 Head QM:[id number] with small vessel chronic ischemic changes of deep cerebral white matter. Age-indeterminate lacunar infarct RIGHT basal ganglia, new since 10/15/2017. Old LEFT thalamic lacunar infarct. No intracranial hemorrhage identified. 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 01/26/2018 CXR: 1.  No definite residual left pleural effusion status post thoracentesis. No pneumothorax. 2. Unchanged interstitial pulmonary edema and left lower lobe opacity which could reflect alveolar edema or pneumonia. 01/27/2018 MRI Brain: 1. Small acute/early subacute infarctions within the bilateral posterior frontal lobe centrum semiovale. No hemorrhage or mass effect. Few probable additional scattered punctate foci of infarction, partially obscured by motion artifact. Distribution favors watershed infarction. 2. Stable moderate to severe chronic microvascular ischemic changes and moderate volume loss of the brain. 3. Stable foci of chronic microhemorrhage, likely related to chronic hypertension. 4. No intracranial metastasis identified.  Assessment & Plan: Principal Problem:   Primary malignant neoplasm of bronchus of left lower lobe (HCC) Active Problems:   Essential hypertension   COPD (chronic obstructive pulmonary disease) with emphysema (HCC)   Chronic respiratory failure with hypoxia, on home O2 therapy (HCC)   Acute on chronic systolic heart failure (HCC)   Physical deconditioning   CAP (community acquired pneumonia)   Malnutrition of moderate degree   Goals of care, counseling/discussion   Palliative care by specialist   Paroxysmal atrial fibrillation (HCC)   Pleural effusion   S/P thoracentesis  Acute hypoxic respiratory failure secondary to COPD exacerbation, acute on chronic systolic CHF, non-small cell lung cancer, malignant pleural effusion: Was also hospitalized at [facility] 12/31 - 1/6 for pneumonia where AFib was difficult to control as well. - Continue oxygen prn. Anticipate ongoing need for this with significance of underlying lung disease - Discussed opioids for dyspnea, though he denies this symptom currently. Has declined further palliative care efforts, remains full code. - Treat contributing conditions as below.  Abdominal pain: Not affecting po intake, no tenderness,  distention or abnormal sounds on exam. Resolved today. - Monitor - With leukocytosis, will trend and culture/CT abd if fevers.  Acute toxic metabolic encephalopathy: Suspect due to narcotic administration.  - Decrease dilaudid dose significantly.  - Delirium precautions  Emphysematous/bullous COPD:  - Continue BD's scheduled and prn, no indication for steroids at this time.   Squamous cell NSCLC: Per family, was slated to start chemotherapy 12/26 and was held due to hypoxia.   - XRT was started 01/31/2018 with plans for 2 weeks per Dr. Lisbeth Renshaw with aim of improving respiratory status. Continuing daily M-F. - Has referral to Dr. Julien Nordmann, establish care appointment 2/3.   PAF with RVR: Uncontrolled. - Continue eliquis and continue metoprolol 100mg  po BID.  - Continue prn metoprolol - Give low dose diltiazem though with HFrEF, do not want to titrate too high.  - Start amiodarone. TSH recently 1.65. K and Mg at goal.  Acute on chronic HFrEF:  - Holding lasix 40mg  po daily home dose with AKI and ongoing little po intake, in fact will continue supporting with sub-maintenance IV fluids, no peripheral overload or crackles noted. - Consider restart ACEi if BPs will sustain and po intake improves AND if pt will allow.  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  - Continue eliquis.  - Not giving statin to reduce po burden - Continue PT, recommending SNF. - No CES on echocardiogram  Dysphagia: Reviewed MBS video images with patient and wife at bedside. - Regular diet to encourage po intake. - Must follow strict adjustments to minimize risk of aspiration including small bites, alt fluid/solid, chin tuck, frequent clearance, etc.  Medication nonadherence: This predates this admission per his wife, though it has gotten worse and is actively impacting his care. We discussed the incongruence of wanting full life saving  measures in a code situation but refusing medications at this time.  - Given encouragement to take medications, and will continue to seek ways to help with this. Improving thus far.  HTN: BP not elevated. - Monitor on medications as above. Holding lisinopril, lasix, and norvasc  AKI: Improved.  Hypernatremia: Resolved with hypotonic saline  Moderate protein calorie malnutrition:  - Supplement with magic cup (prefers chocolate, won't eat anything else), ensure enlive per dietitian recommendations. Will continue to offer this. He will not accept prostat, will discontinue. - Hold MVM to decrease pill burden  Hyperlipidemia:  - Hold statin for now to decrease pill burden  Allergies:  - Hold antihistamine to decrease pill burden  DVT prophylaxis: Eliquis Code Status: Full Family Communication: Wife at bedside this AM. Disposition Plan: Uncertain, planning discharge to SNF once stabilized. Consultants:   Oncology  Radiation oncology  Palliative care medicine  Procedures:   None  Antimicrobials:  None   Subjective: Denies any abdominal or chest pain, no dyspnea or wheezing. Does not feel palpitations. HR up overnight despite adherence to medications.  Wife states she and his family/visitors feel he looks better than last week.  Objective: Vitals:   02/07/18 1230 02/07/18 1231 02/07/18 1300 02/07/18 1330  BP:  (!) 123/91 (!) 102/92 (!) 123/96  Pulse: (!) 107 (!) 103 94 (!) 110  Resp:    (!) 27  Temp:    (!) 97.3 F (36.3 C)  TempSrc:    Axillary  SpO2:    90%  Weight:      Height:        Intake/Output Summary (Last 24 hours) at 02/07/2018 1414 Last data filed at 02/07/2018 0830 Gross per 24 hour  Intake 1269.93 ml  Output 200 ml  Net 1069.93 ml   Filed Weights   02/03/18 0537 02/06/18 0629 02/07/18 0449  Weight: 73.2 kg 75.2  kg 78.1 kg   Gen: 78 y.o. male in no distress, alert, not readily interactive with discussions Pulm: Nonlabored breathing supplemental  oxygen. No wheezing or crackles noted. CV: Irreg irreg, rate in 110's. No murmur, rub, or gallop. No JVD, no dependent edema. GI: Abdomen soft, non-tender, non-distended, with normoactive bowel sounds.  Ext: Warm, no deformities Skin: No rashes, lesions or ulcers on visualized skin. Neuro: Alert, sparse speech, not asked orientation questions today. No focal neurological deficits. Psych: Judgement and insight appear probably fair. Mood irritable & affect congruent. Behavior is appropriate.    Data Reviewed: I have personally reviewed following labs and imaging studies  CBC: Recent Labs  Lab 02/02/18 0449 02/03/18 0623 02/05/18 0540 02/07/18 0516  WBC 13.7* 13.2* 12.8* 12.5*  HGB 12.0* 12.0* 12.4* 12.2*  HCT 40.8 39.9 40.8 41.1  MCV 101.5* 99.8 98.1 99.0  PLT 104* 108* 157 431   Basic Metabolic Panel: Recent Labs  Lab 02/03/18 0623 02/04/18 0539 02/05/18 0540 02/06/18 0523 02/07/18 0516  NA 140 140 137 137 135  K 4.8 4.2 4.2 4.3 4.3  CL 102 103 102 101 102  CO2 28 27 25 26 22   GLUCOSE 103* 98 98 100* 101*  BUN 57* 49* 41* 47* 45*  CREATININE 1.34* 1.22 1.12 1.29* 1.22  CALCIUM 8.7* 8.7* 8.5* 8.7* 8.6*  MG 2.3  --   --   --   --    Urine analysis:    Component Value Date/Time   COLORURINE YELLOW 10/15/2017 1850   APPEARANCEUR CLEAR 10/15/2017 1850   LABSPEC 1.032 (H) 10/15/2017 1850   PHURINE 7.0 10/15/2017 1850   GLUCOSEU NEGATIVE 10/15/2017 1850   HGBUR NEGATIVE 10/15/2017 1850   BILIRUBINUR NEGATIVE 10/15/2017 1850   KETONESUR NEGATIVE 10/15/2017 1850   PROTEINUR NEGATIVE 10/15/2017 1850   NITRITE NEGATIVE 10/15/2017 1850   LEUKOCYTESUR NEGATIVE 10/15/2017 1850   No results found for this or any previous visit (from the past 240 hour(s)).    Radiology Studies: No results found.  Scheduled Meds: . apixaban  5 mg Oral BID  . diltiazem  180 mg Oral Daily  . feeding supplement (ENSURE ENLIVE)  237 mL Oral BID BM  . feeding supplement (PRO-STAT SUGAR FREE  64)  30 mL Oral TID  . mouth rinse  15 mL Mouth Rinse BID  . metoprolol tartrate  100 mg Oral BID  . umeclidinium bromide  1 puff Inhalation Daily   Continuous Infusions: . sodium chloride 100 mL/hr at 02/07/18 0653     LOS: 18 days   Time spent: 35 minutes.  Patrecia Pour, MD [facility] [email or web address] Password [facility] 02/07/2018, 2:14 PM

## 2018-02-07 NOTE — Progress Notes (Signed)
Occupational Therapy Treatment Patient Details Name: Cody Lane MRN: [id number] DOB: March 26, 1940 Today's Date: 02/07/2018    History of present illness  78 year old male with a past medical history significant for non-small cell lung cancer that has been recently diagnosed, COPD with emphysema, chronic congestive heart failure with reduced ejection fraction, A. Fib, history of aortic valve replacement.  As noted he was recently discharged 3 days ago after hospitalization for hypoxia and pneumonia as well as acute metabolic encephalopathy due to those conditions.  At that hospitalization he ended up being discharged on oral antibiotics and with home oxygen therapy.  At home he removed his oxygen to go to the bathroom in the bathroom he developed left-sided chest pain.    OT comments  Pt lethargic this session requiring cues to arouse and engage in therapy session. Pt requiring increased assist for simple grooming task at bed level and only intermittently following one step commands. Pt engaged in bil UE P/AAROM during session, and requiring maxA to reposition in bed for comfort end of session. Feel SNF recommendation remains appropriate at this time. Will continue to follow acutely to progress pt towards established OT Goals.    Follow Up Recommendations  SNF;Supervision/Assistance - 24 hour    Equipment Recommendations  Other (comment)(TBD in next venue)          Precautions / Restrictions Precautions Precautions: Fall Precaution Comments: watch HR, 02 dependent Restrictions Weight Bearing Restrictions: No       Mobility Bed Mobility Overal bed mobility: Needs Assistance             General bed mobility comments: MaxA to reposition at bed level                                                                   ADL either performed or assessed with clinical judgement   ADL Overall ADL's : Needs assistance/impaired     Grooming: Maximal  assistance;Total assistance;Bed level Grooming Details (indicate cue type and reason): pt only intermittently following commands and intermittently lethargic, not able to perform task this session                               General ADL Comments: pt engaged in simple grooming ADL and UE/LE P/AAROM, pt does state "it feels good" to move when assisting with UE ROM     Vision       Perception     Praxis      Cognition Arousal/Alertness: Lethargic Behavior During Therapy: Flat affect Overall Cognitive Status: Impaired/Different from baseline                   Orientation Level: (able to state [name]) Current Attention Level: Focused Memory: Decreased short-term memory Following Commands: Follows one step commands inconsistently Safety/Judgement: Decreased awareness of safety;Decreased awareness of deficits   Problem Solving: Slow processing;Difficulty sequencing;Requires verbal cues;Requires tactile cues;Decreased initiation General Comments: pt intermittently lethargic but able to arouse fairly easily        Exercises Exercises: General Upper Extremity;General Lower Extremity;Other exercises General Exercises - Upper Extremity Shoulder Flexion: Both;AAROM;5 reps;Supine;PROM Elbow Flexion: PROM;AAROM;Both;Supine;5 reps Elbow Extension: PROM;AAROM;Both;Supine Wrist Flexion: PROM;Left;10 reps;Supine Wrist Extension: PROM;Left;10 reps;Supine Digit  Composite Flexion: PROM;AAROM;Supine;Both Composite Extension: PROM;AAROM;Supine;Both General Exercises - Lower Extremity Ankle Circles/Pumps: 5 reps;PROM;AAROM;Both;Supine   Shoulder Instructions       General Comments HR fluctuating between 93-110 with bed level activity    Pertinent Vitals/ Pain       Pain Assessment: Faces Faces Pain Scale: No hurt Pain Intervention(s): Monitored during session  Home Living                                          Prior Functioning/Environment               Frequency  Min 2X/week        Progress Toward Goals  OT Goals(current goals can now be found in the care plan section)  Progress towards OT goals: OT to reassess next treatment  Acute Rehab OT Goals Patient Stated Goal: to get some sleep OT Goal Formulation: With patient Time For Goal Achievement: 02/21/18 Potential to Achieve Goals: [place] Discharge plan remains appropriate    Co-evaluation                 AM-PAC OT "6 Clicks" Daily Activity     Outcome Measure   Help from another person eating meals?: A Lot Help from another person taking care of personal grooming?: A Lot Help from another person toileting, which includes using toliet, bedpan, or urinal?: Total Help from another person bathing (including washing, rinsing, drying)?: A Lot Help from another person to put on and taking off regular upper body clothing?: A Lot Help from another person to put on and taking off regular lower body clothing?: Total 6 Click Score: 10    End of Session Equipment Utilized During Treatment: Oxygen  OT Visit Diagnosis: Unsteadiness on feet (R26.81);Muscle weakness (generalized) (M62.81)   Activity Tolerance Patient limited by lethargy   Patient Left in bed;with call bell/phone within reach;with bed alarm set   Nurse Communication Mobility status        Time: 6811-5726 OT Time Calculation (min): 16 min  Charges: OT General Charges $OT Visit: 1 Visit OT Treatments $Self Care/Home Management : 8-22 mins  Cody Lane, OT Supplemental Rehabilitation Services Pager [phone] Office [phone]    Cody Lane 02/07/2018, 1:14 PM

## 2018-02-07 NOTE — Care Management Important Message (Signed)
Important Message  Patient Details  Name: Cody Lane MRN: [id number] Date of Birth: 10-23-1940   Medicare Important Message Given:  Yes    Kerin Salen 02/07/2018, 1:21 PMImportant Message  Patient Details  Name: Cody Lane MRN: [id number] Date of Birth: [birth year]   Medicare Important Message Given:  Yes    Kerin Salen 02/07/2018, 1:21 PM

## 2018-02-07 NOTE — Progress Notes (Signed)
Pt was given scheduled lopressor 100 mg oral at 2104. At 2215 HR was still 120s-130s. RN paged on call Bodenheimer and received order for 10 mg IV cardiazem. Bodenheimer told this RN to administer medication when pt HR sustained 130s-140s. Pt HR was in given parameters at 0012 and this RN administered the 10 mg IV cardiazem. At [place], pt HR was in the 90s. Will continue to monitor the pt closely.

## 2018-02-07 NOTE — Progress Notes (Signed)
Daily Progress Note   Patient Name: Cody Lane       Date: 02/07/2018 DOB: [birth year]  Age: 78 y.o. MRN#: [id number] Attending Physician: Patrecia Pour, MD Primary Care Physician: [name] Admit Date: 01/20/2018  Reason for Consultation/Follow-up: Establishing goals of care  Subjective:  Patient is resting in bed. He awake alert, watching television. Does not engage or attempt to participate. Denies pain.   Chart reviewed No family at bedside   Length of Stay: 18  Current Medications: Scheduled Meds:  . apixaban  5 mg Oral BID  . diltiazem  180 mg Oral Daily  . feeding supplement (ENSURE ENLIVE)  237 mL Oral BID BM  . feeding supplement (PRO-STAT SUGAR FREE 64)  30 mL Oral TID  . mouth rinse  15 mL Mouth Rinse BID  . metoprolol tartrate  100 mg Oral BID  . umeclidinium bromide  1 puff Inhalation Daily    Continuous Infusions: . sodium chloride 100 mL/hr at 02/07/18 0653    PRN Meds: acetaminophen **OR** acetaminophen, HYDROmorphone (DILAUDID) injection, levalbuterol, metoprolol tartrate, ondansetron (ZOFRAN) IV, RESOURCE THICKENUP CLEAR, senna-docusate  Physical Exam         Weak appearing gentleman Diminished breath sound No abdominal pain No edema Awake alert.  Generalized weakness evident  Vital Signs: BP (!) 140/100   Pulse (!) 110   Temp (!) 97.5 F (36.4 C) (Oral)   Resp (!) 30   Ht 6' (1.829 m)   Wt 78.1 kg   SpO2 91%   BMI 23.35 kg/m  SpO2: SpO2: 91 % O2 Device: O2 Device: Nasal Cannula O2 Flow Rate: O2 Flow Rate (L/min): 4 L/min  Intake/output summary:   Intake/Output Summary (Last 24 hours) at 02/07/2018 1236 Last data filed at 02/07/2018 0830 Gross per 24 hour  Intake 1269.93 ml  Output 200 ml  Net 1069.93 ml   LBM: Last BM  Date: 02/05/18 Baseline Weight: Weight: 76.2 kg Most recent weight: Weight: 78.1 kg      PPS 30% Palliative Assessment/Data:    Flowsheet Rows     Most Recent Value  Intake Tab  Referral Department  Hospitalist  Unit at Time of Referral  Intermediate Care Unit  Palliative Care Primary Diagnosis  Pulmonary  Date Notified  01/20/18  Palliative Care Type  New Palliative care  Reason for referral  Clarify Goals of Care, Psychosocial or Spiritual support  Date of Admission  01/20/18  Date first seen by Palliative Care  01/21/18  # of days Palliative referral response time  1 Day(s)  # of days IP prior to Palliative referral  0  Clinical Assessment  Pain Max last 24 hours  Not able to report  Pain Min Last 24 hours  Not able to report  Dyspnea Max Last 24 Hours  Not able to report  Dyspnea Min Last 24 hours  Not able to report  Nausea Max Last 24 Hours  Not able to report  Nausea Min Last 24 Hours  Not able to report  Anxiety Max Last 24 Hours  Not able to report  Anxiety Min Last 24 Hours  Not able to report  Other Max Last 24 Hours  Not able to report  Psychosocial & Spiritual Assessment  Palliative Care Outcomes  Patient/Family meeting held?  Yes  Who was at the meeting?  pt and wife  Palliative Care follow-up planned  Yes, Facility      Patient Active Problem List   Diagnosis Date Noted  . S/P thoracentesis   . Pleural effusion   . Paroxysmal atrial fibrillation (HCC)   . Acute on chronic systolic heart failure ([place]) 01/21/2018  . Physical deconditioning 01/21/2018  . CAP (community acquired pneumonia) 01/21/2018  . Malnutrition of moderate degree 01/21/2018  . Goals of care, counseling/discussion   . Palliative care by specialist   . Chest pain 01/20/2018  . Primary malignant neoplasm of bronchus of left lower lobe ([place]) 01/20/2018  . COPD (chronic obstructive pulmonary disease) with emphysema ([place]) 01/20/2018  . Chronic respiratory failure with hypoxia, on home  O2 therapy ([place]) 01/20/2018  . Vertigo 10/15/2017  . Diplopia 10/15/2017  . Cough   . Aortic valve insufficiency S/P aortic valve replacement 03/26/2014  . Kidney mass 03/26/2014  . Essential hypertension 03/26/2014  . Permanent atrial fibrillation 03/26/2014  . Long term current use of anticoagulant therapy 03/26/2014  . Hyperlipidemia 03/26/2014  . CHF (congestive heart failure) ([place]) 03/26/2014  . Insomnia 03/26/2014    Palliative Care Assessment & Plan   Patient Profile:  78 year old male with a history of emphysematous COPD, systolic CHF, atrial fibrillation, recent CVA who is admitted for acute hypoxic respiratory failure. 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 He was in the process of initiating chemotherapy through the [place] system but this was deferred because he became hypoxemic and was hospitalized.   He was recently discharged and readmitted to [facility] acute hypoxic respiratory failure secondary to acute on chronic systolic CHF, knownlung cancer, chronic COPD, and community-acquired pneumonia. He completed a full course of antibiotics for CAP and has been diuresed for pulmonary edema. He underwent a thoracentesis with 900 cc of fluid removed. 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 Despite removal of fluid he continued to have increased supplemental oxygen requirements. Pulmonology and radiation oncology both consulted and recommendeded transfer to [facility] for palliative radiation.   His hospital course has been complicated by by hypotension, persistent atrial fibrillation with RVR, intermittent confusion, difficulty swallowing, and subacute stroke.  Assessment:  ongoing functional decline Ongoing dysphagia Ongoing cancer related cachexia Patient refusing  radiation/other treatments at times Abdominal pain.   Recommendations/Plan:  Continue IV dilaudid to be  used PRN.  We have attempted code status and overall goals of care discussions several times both with patient and his wife. At present, remains full code.    For now, continue to attempt radiation treatments, continue to encourage PO and OOB.    PMT to follow prn, ongoing efforts to engage with patient and his wife regarding code status and overall goals of care discussions.  Spiritual care consult for extra support for wife and family    Goals of Care and Additional Recommendations:  Limitations on Scope of Treatment: Full Scope Treatment  Code Status:    Code Status Orders  (From admission, onward)         Start     Ordered   01/20/18 1429  Full code  Continuous     01/20/18 1428        Code Status History    Date Active Date Inactive Code Status Order ID Comments User Context   10/15/2017 2152 10/18/2017 1947 Full Code [id number]  Rise Patience, MD ED       Prognosis:   Unable to determine  Discharge Planning:  To Be Determined  Care plan was discussed with  Patient   Thank you for allowing the Palliative Medicine Team to assist in the care of this patient.   Time In: 12 Time Out: 12.15 Total Time 15 Prolonged Time Billed  no       Greater than 50%  of this time was spent counseling and coordinating care related to the above assessment and plan.  Loistine Chance, MD [phone] Please contact Palliative Medicine Team phone at [phone] for questions and concerns.

## 2018-02-07 NOTE — Progress Notes (Signed)
   02/07/18 0929  MEWS Score  Resp (!) 30  Pulse Rate (!) 140  BP (!) 140/110  SpO2 91 %  O2 Device Nasal Cannula  O2 Flow Rate (Lane/min) 4 Lane/min  MEWS Score  MEWS RR 2  MEWS Pulse 3  MEWS Systolic 0  MEWS LOC 0  MEWS Temp 0  MEWS Score 5  MEWS Score Color Red      Vital Signs MEWS/VS Documentation      02/07/2018 1231 02/07/2018 1300 02/07/2018 1330 02/07/2018 1430   MEWS Score:  3  2  3  2    MEWS Score Color:  Yellow  Yellow  Yellow  Yellow   Resp:  -  -  (!) 27  (!) 28   Pulse:  (!) 103  94  (!) 110  100   BP:  (!) 123/91  (!) 102/92  (!) 123/96  127/88   Temp:  -  -  (!) 97.3 F (36.3 C)  (!) 97.4 F (36.3 C)   O2 Device:  -  -  Nasal Cannula  Nasal Cannula   O2 Flow Rate (Lane/min):  -  -  4 Lane/min  4 Lane/min       Mews score turned from yellow to red at 0930 this am d/t increased HR, BP, and Respiratory rate. MD made aware during bedside progression rounds. Patient had already been given scheduled and PRN medications per [facility] to treat the increased HR and BP. Patient's respiratory rate is increased r/t the lung cancer per the MD during rounds.   Patient responded well to medications. HR and BP improved. Will continue to monitor patient for changes.     Cody Lane, Cody Lane 02/07/2018,5:32 PM

## 2018-02-07 NOTE — Progress Notes (Signed)
[place] [facility] Therapy Treatment Record Phone [phone]   Radiation Therapy was administered to Cody Lane on: 02/07/2018  2:28 PM and was treatment # 6 out of a planned course of 10 treatments.  Radiation Treatment  1). Beam photons with 6-10 energy  2). Brachytherapy None  3). Stereotactic Radiosurgery None  4). Other Radiation None     Jacques Earthly, RT (T)

## 2018-02-07 NOTE — Progress Notes (Signed)
Pt HR sustaining 130s-140s. PRN IV lopressor given. Will continue to monitor the pt.

## 2018-02-08 ENCOUNTER — Ambulatory Visit
Admit: 2018-02-08 | Discharge: 2018-02-08 | Disposition: A | Discharge status: Home / Self Care | Payer: Medicare Other | Attending: Radiation Oncology | Admitting: Radiation Oncology

## 2018-02-08 MED ORDER — SODIUM CHLORIDE 0.9% FLUSH
10.0000 mL | Freq: Two times a day (BID) | INTRAVENOUS | Status: DC
  Administered 2018-02-09 (×2): 10 mL
  Administered 2018-02-10: 20 mL
  Administered 2018-02-10 – 2018-02-11 (×2): 10 mL
  Administered 2018-02-11: 40 mL
  Administered 2018-02-12: 10 mL

## 2018-02-08 MED ORDER — METHYLPREDNISOLONE SODIUM SUCC 40 MG IJ SOLR
40.0000 mg | Freq: Every day | INTRAMUSCULAR | Status: DC
  Administered 2018-02-08 – 2018-02-12 (×4): 40 mg via INTRAVENOUS
  Filled 2018-02-08 (×4): qty 1

## 2018-02-08 MED ORDER — SODIUM CHLORIDE 0.9% FLUSH: 10.0000 mL | INTRAVENOUS | Status: DC | PRN

## 2018-02-08 MED ORDER — DILTIAZEM HCL ER COATED BEADS 120 MG PO CP24
240.0000 mg | ORAL_CAPSULE | Freq: Every day | ORAL | Status: DC
  Administered 2018-02-10 – 2018-02-11 (×2): 240 mg via ORAL
  Filled 2018-02-08 (×2): qty 2

## 2018-02-08 NOTE — Progress Notes (Signed)
[place] [facility] Therapy Treatment Record Phone [phone]   Radiation Therapy was administered to [name] on: 02/08/2018  2:29 PM and was treatment # 7 out of a planned course of 10 treatments.  Radiation Treatment  1). Beam photons with 6-10 energy  2). Brachytherapy None  3). Stereotactic Radiosurgery None  4). Other Radiation None     Ezrael Sam A Janalyn Higby, RT (T)

## 2018-02-08 NOTE — Progress Notes (Addendum)
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 Fib, history of aortic valve replacement.  As noted he was recently discharged 3 days ago after hospitalization for hypoxia and pneumonia as well as acute metabolic encephalopathy due to those conditions.  At that hospitalization he ended up being discharged on oral antibiotics and with home oxygen therapy.  At home he removed his oxygen to go to the bathroom in the bathroom he developed left-sided chest pain.     PT Comments    PT's focus for this session was sitting balance, tolerance for upright, and LE ROM and strengthening. Pt required max assist for bed mobility tasks, and participated very little in LE exercises. Pt with heavy accessory muscle breathing for duration of session, and required increased O2 requirement to maintain sats >90%, but even on 4LO2 pt with tendency to drop to low-mid 80s% on SpO2. Pt instructed in deep breathing techniques (in through your nose, out through your mouth), but pt with limited application of this due to difficulty with command following. Pt positioned with pillows under UEs and LEs, with heels floating for pressure relief. PT to continue to follow acutely.    Follow Up Recommendations  SNF     Equipment Recommendations  Other (comment)(TBD at next venue)    Recommendations for Other Services       Precautions / Restrictions Precautions Precautions: Fall Precaution Comments: watch HR, 02 dependent Restrictions Weight Bearing Restrictions: No    Mobility  Bed Mobility Overal bed mobility: Needs Assistance Bed Mobility: Rolling;Supine to Sit;Sit to Supine Rolling: Max assist   Supine to  sit: Max assist;+2 for physical assistance Sit to supine: +2 for physical assistance;Max assist   General bed mobility comments: Max assist for rolling to place new bed pad, pt's original bed pad soiled with perspiration. Max assist for trunk elevation and lowering, LE lifting and translation. Pt sat EOB with PT and PT aide tactile cuing and support for ~10 minutes. Pt applying tactile facilitation to R pelvis and L shoulder girdle to correct L lateral leaning. Pt with difficulty using UEs to support himself sitting EOB, required constant external support to sit.   Transfers Overall transfer level: (Pt fatigued from sitting EOB, not able to tolerate transfer)                  Ambulation/Gait                 Stairs             Wheelchair Mobility    Modified Rankin (Stroke Patients Only)       Balance Overall balance assessment: Needs assistance;History of Falls Sitting-balance support: Feet supported;Bilateral upper extremity supported Sitting balance-Leahy Scale: Poor Sitting balance - Comments: flexed spinal position with forward flexed head, unable to correct posture without extenral support Postural control: Left lateral lean   Standing balance-Leahy Scale: Zero Standing balance comment: NT this session, unable to attempt due to pt fatigue and weakness                            Cognition Arousal/Alertness: Lethargic Behavior During Therapy: Flat affect Overall Cognitive Status: Impaired/Different from baseline Area of Impairment:  Memory;Attention;Following commands;Safety/judgement;Problem solving;Awareness;Orientation                 Orientation Level: Disoriented to;Person(When asked what pt's name was when given 2 choices, pt answers "you tell me") Current Attention Level: Focused Memory: Decreased short-term memory Following Commands: Follows one step commands inconsistently Safety/Judgement: Decreased awareness of deficits    Problem Solving: Slow processing;Decreased initiation;Difficulty sequencing;Requires verbal cues;Requires tactile cues General Comments: Pt with periods of alertness and periods of not responding to PT with far-off look.       Exercises General Exercises - Lower Extremity Ankle Circles/Pumps: PROM;Both;10 reps;Supine Heel Slides: AROM;AAROM;Both;10 reps;Supine    General Comments General comments (skin integrity, edema, etc.): Pt with heavy accessory muscle breathing for entirety of session, RR in the 30s intermittently during PT session. HR up to 113 bpm during PT session, O2sats 82-94% on 4LO2 via [place]. Upon arrival to room, pt consistently satting 86-88% on 3LO2, pt placed on 4L and RN notified.      Pertinent Vitals/Pain Pain Assessment: No/denies pain Pain Intervention(s): Monitored during session    Home Living                      Prior Function            PT Goals (current goals can now be found in the care plan section) Acute Rehab PT Goals PT Goal Formulation: Patient unable to participate in goal setting Time For Goal Achievement: 02/22/18 Potential to Achieve Goals: Fair Progress towards PT goals: Not progressing toward goals - comment(pt very fatigued and with decreased awareness, limited by tachypnea and SpO2, weakness)    Frequency    Min 2X/week      PT Plan Current plan remains appropriate    Co-evaluation              AM-PAC PT "6 Clicks" Mobility   Outcome Measure  Help needed turning from your back to your side while in a flat bed without using bedrails?: A Lot Help needed moving from lying on your back to sitting on the side of a flat bed without using bedrails?: A Lot Help needed moving to and from a bed to a chair (including a wheelchair)?: Total Help needed standing up from a chair using your arms (e.g., wheelchair or bedside chair)?: Total Help needed to walk in hospital room?: Total Help needed climbing 3-5 steps with a  railing? : Total 6 Click Score: 8    End of Session Equipment Utilized During Treatment: Oxygen Activity Tolerance: Patient limited by fatigue;Patient limited by lethargy Patient left: in bed;with call bell/phone within reach;with bed alarm set;with family/visitor present Nurse Communication: Mobility status;Other (comment)(O2 requirements ) PT Visit Diagnosis: Muscle weakness (generalized) (M62.81);Other abnormalities of gait and mobility (R26.89)     Time: 7048-8891 PT Time Calculation (min) (ACUTE ONLY): 33 min  Charges:  $Therapeutic Exercise: 8-22 mins $Neuromuscular Re-education: 8-22 mins                    Jeris Roser Conception Chancy, PT Acute Rehabilitation Services Pager [phone]  Office [phone]    Da Authement D Elonda Husky 02/08/2018, 3:12 PM

## 2018-02-08 NOTE — Progress Notes (Signed)
PROGRESS NOTE  RAHMON Lane  GYK:[id number] DOB: 03-04-1940 DOA: 01/20/2018 PCP: [name]   Brief Narrative: Cody Lane is a 78 year old male with a history of emphysematous COPD, systolic CHF, atrial fibrillation, recent CVA who is admitted for acute hypoxic respiratory failure. 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 He was in the process of initiating chemotherapy through the [place] system but this was deferred because he became hypoxemic and was hospitalized on these occasions. He was recently discharged and readmitted to [facility] acute hypoxic respiratory failure secondary to acute on chronic systolic CHF, knownlung cancer, chronic COPD, and community-acquired pneumonia. He completed a full course of antibiotics for CAP and has been diuresed for pulmonary edema. He underwent a thoracentesis with 900 cc of fluid removed. 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 Despite removal of fluid he continues to have increased supplemental oxygen requirements. Pulmonology and radiation oncology both consulted and recommended transfer to [facility] for palliative radiation.   His hospital course has been complicated by by hypotension, persistent atrial fibrillation with RVR, intermittent confusion, difficulty swallowing, and subacute stroke.  Significant Events: 01/25/2018 Head BL:[id number] with small vessel chronic ischemic changes of deep cerebral white matter. Age-indeterminate lacunar infarct RIGHT basal ganglia, new since 10/15/2017. Old LEFT thalamic lacunar infarct. No intracranial hemorrhage identified. 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 01/26/2018 CXR: 1.  No definite residual left pleural effusion status post thoracentesis. No pneumothorax. 2. Unchanged interstitial pulmonary edema and left lower lobe opacity which could reflect alveolar edema or pneumonia. 01/27/2018 MRI Brain: 1. Small acute/early subacute infarctions within the bilateral posterior frontal lobe centrum semiovale. No hemorrhage or mass effect. Few probable additional scattered punctate foci of infarction, partially obscured by motion artifact. Distribution favors watershed infarction. 2. Stable moderate to severe chronic microvascular ischemic changes and moderate volume loss of the brain. 3. Stable foci of chronic microhemorrhage, likely related to chronic hypertension. 4. No intracranial metastasis identified.  Assessment & Plan: Principal Problem:   Primary malignant neoplasm of bronchus of left lower lobe (HCC) Active Problems:   Essential hypertension   COPD (chronic obstructive pulmonary disease) with emphysema (HCC)   Chronic respiratory failure with hypoxia, on home O2 therapy (HCC)   Acute on chronic systolic heart failure (HCC)   Physical deconditioning   CAP (community acquired pneumonia)   Malnutrition of moderate degree   Goals of care, counseling/discussion   Palliative care by specialist   Paroxysmal atrial fibrillation (HCC)   Pleural effusion   S/P thoracentesis  Acute hypoxic respiratory failure secondary to COPD exacerbation, acute on chronic systolic CHF, non-small cell lung cancer, malignant pleural effusion: Was also hospitalized at [facility] 12/31 - 1/6 for pneumonia where AFib was difficult to control as well. - Continue oxygen prn. Anticipate ongoing need for this with significance of underlying lung disease - Discussed opioids for dyspnea, though he denies this symptom currently. Has declined further palliative care efforts, remains full code. - Treat contributing conditions as below.  Emphysematous/bullous COPD:  - Continue BD's (xopenex).  Developing some wheezing today, will start 40mg  daily steroid.   Squamous cell NSCLC: Per family, was slated to start chemotherapy 12/26 and was held due to hypoxia.   - XRT was started 01/31/2018 with plans for 2 weeks per  Dr. Lisbeth Renshaw with aim of improving respiratory status. Continuing daily M-F. - Has referral to Dr. Julien Nordmann, establish care appointment 2/3.  - Strongly recommend continued goals of care discussions near the end of radiation therapy.  PAF with RVR: Rate still intermittently elevated. - Continue eliquis and continue metoprolol 100mg  po BID.  - Continue prn metoprolol IV - Will increase diltiazem though with HFrEF, do not want to titrate dose too high.  - Started amiodarone. TSH recently 1.65. K and Mg at goal.  Acute on chronic HFrEF:  - Holding lasix 40mg  po daily home dose with AKI and ongoing little po intake, in fact will continue supporting with sub-maintenance IV fluids, no peripheral overload or crackles noted. - Consider restart ACEi if BPs will sustain and po intake improves AND if pt will allow.  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  - Continue eliquis.  - Not giving statin to reduce po burden - Continue PT, recommending SNF. - No CES on echocardiogram  Dysphagia: Reviewed MBS video images with patient and wife at bedside. SLP evaluations ongoing.  - Regular diet to encourage po intake per lengthy goals of care discussion at bedside today. This places at risk of aspiration but is best chance to augment nutritional intake.  - Must follow strict adjustments to minimize risk of aspiration including small bites, alt fluid/solid, chin tuck, frequent clearance, etc.  Acute toxic metabolic encephalopathy: Suspect due to narcotic administration. Resolved.  - Decrease dilaudid dose significantly.  - Delirium precautions  Abdominal pain: Resolved spontaneously. Exam benign  Medication  nonadherence: This predates this admission per his wife, though it has gotten worse and is actively impacting his care. We discussed the incongruence of wanting full life saving measures in a code situation but refusing medications at this time.  - Given encouragement to take medications, and will continue to seek ways to help with this. Improving thus far.  HTN: BP not elevated. - Monitor on medications as above. Holding lisinopril, lasix, and norvasc  AKI: Improved.  Hypernatremia: Resolved with hypotonic saline  Moderate protein calorie malnutrition:  - Supplement with magic cup (prefers chocolate, won't eat anything else), ensure enlive per dietitian recommendations. Will continue to offer this. He will not accept prostat, will discontinue. - Discussed with dietitian today. Could consider alternative forms of feeding, though this would not stop the risk of aspiration. If goals of care are aggressive, would need to entertain the concept of tube feeding, but this has not been proposed as a solution by the medical team. - Hold MVM to decrease pill burden  Hyperlipidemia:  - Hold statin for now to decrease pill burden  Allergies:  - Hold antihistamine to decrease pill burden  DVT prophylaxis: Eliquis Code Status: Full Family Communication: Wife at bedside this AM. Disposition Plan: Uncertain, planning discharge to SNF once stabilized. Consultants:   Oncology  Radiation oncology  Palliative care medicine  Procedures:   None  Antimicrobials:  None   Subjective: Eating about 10% of meals. He denies pain anywhere, denies dyspnea. Taking pills regularly when given whole. His wife states he's more alert and seeming more himself lately.  Objective: Vitals:   02/08/18 0745 02/08/18 0855 02/08/18 1000 02/08/18 1229  BP: (!) 127/96  (!) 140/103 (!) 116/102  Pulse: 64  (!) 103 (!) 102  Resp: (!) 26  (!) 24 (!) 26  Temp: 97.6 F (36.4 C)  97.6 F (36.4 C) (!) 97.4 F (36.3  C)  TempSrc: Axillary  Oral   SpO2: 93% 97% 90% 92%  Weight:      Height:        Intake/Output Summary (Last 24 hours) at 02/08/2018 1339 Last data filed at 02/08/2018 1229 Gross per 24 hour  Intake 2259.87 ml  Output 225 ml  Net 2034.87 ml   Filed Weights   02/03/18 0537 02/06/18 0629 02/07/18 0449  Weight: 73.2 kg 75.2 kg 78.1 kg   Gen: 78 y.o. male in no distress Pulm: Expiratory wheezing, tachypneic with supplemental oxygen. CV: IRreg with rate 90-110bpm higher when exerting himself. No murmur, rub, or gallop. No JVD, no dependent edema. GI: Abdomen soft, non-tender, non-distended, with normoactive bowel sounds.  Ext: Warm, no deformities Skin: No rashes, lesions or ulcers on visualized skin. Neuro: Alert and oriented. No focal neurological deficits. Psych: Judgement and insight appear fair. Mood euthymic & affect congruent. Behavior is appropriate.    Data Reviewed: I have personally reviewed following labs and imaging studies  CBC: Recent Labs  Lab 02/02/18 0449 02/03/18 0623 02/05/18 0540 02/07/18 0516  WBC 13.7* 13.2* 12.8* 12.5*  HGB 12.0* 12.0* 12.4* 12.2*  HCT 40.8 39.9 40.8 41.1  MCV 101.5* 99.8 98.1 99.0  PLT 104* 108* 157 580   Basic Metabolic Panel: Recent Labs  Lab 02/03/18 0623 02/04/18 0539 02/05/18 0540 02/06/18 0523 02/07/18 0516  NA 140 140 137 137 135  K 4.8 4.2 4.2 4.3 4.3  CL 102 103 102 101 102  CO2 28 27 25 26 22   GLUCOSE 103* 98 98 100* 101*  BUN 57* 49* 41* 47* 45*  CREATININE 1.34* 1.22 1.12 1.29* 1.22  CALCIUM 8.7* 8.7* 8.5* 8.7* 8.6*  MG 2.3  --   --   --   --    Urine analysis:    Component Value Date/Time   COLORURINE YELLOW 10/15/2017 1850   APPEARANCEUR CLEAR 10/15/2017 1850   LABSPEC 1.032 (H) 10/15/2017 1850   PHURINE 7.0 10/15/2017 1850   GLUCOSEU NEGATIVE 10/15/2017 1850   HGBUR NEGATIVE 10/15/2017 1850   BILIRUBINUR NEGATIVE 10/15/2017 1850   KETONESUR NEGATIVE 10/15/2017 1850   PROTEINUR NEGATIVE 10/15/2017  1850   NITRITE NEGATIVE 10/15/2017 1850   LEUKOCYTESUR NEGATIVE 10/15/2017 1850   No results found for this or any previous visit (from the past 240 hour(s)).    Radiology Studies: No results found.  Scheduled Meds: . amiodarone  200 mg Oral BID  . apixaban  5 mg Oral BID  . diltiazem  180 mg Oral Daily  . feeding supplement (ENSURE ENLIVE)  237 mL Oral BID BM  . mouth rinse  15 mL Mouth Rinse BID  . metoprolol tartrate  100 mg Oral BID  . umeclidinium bromide  1 puff Inhalation Daily   Continuous Infusions: . sodium chloride 100 mL/hr at 02/08/18 1250     LOS: 19 days   Time spent: 35 minutes.  Patrecia Pour, MD [facility] [email or web address] Password [email or web address] 02/08/2018, 1:39 PM

## 2018-02-08 NOTE — Progress Notes (Signed)
  Speech Language Pathology Treatment: Dysphagia  Patient Details Name: Cody Lane MRN: [id number] DOB: 1940-09-18 Today's Date: 02/08/2018 Time: 3846-6599 SLP Time Calculation (min) (ACUTE ONLY): 35 min  Assessment / Plan / Recommendation Clinical Impression  Patient's wife was present. Spoke with her about patient's appetite over the weekend and her observations. (pt was changed by MD to Reg, thin liquid diet on Saturday). Patient is currently having WOB and wheezing. He answered a few questions with one word answers. He tolerated a bite of banana with prolonged mastication and bolus formation. With a sip of ensure he exhibited a delay throat clear and with small is of cranberry juice he had a delayed wet cough. Dr. Bonner Puna agreed he feels patient is aspirating and told wife he would recommend a modified diet to reduce risk of aspiration. Wife is still very concerned for his PO intake and him not wanting the modified diet. He has been agreeable to drink the nectar thickened mighty shakes. Wife was agreeable to a Reg diet (she is ordering soft foods) with nectar thickened liquids and medication whole in puree (pt refuses to take pills crushed). She understands that he remains at risk for aspiration. Further education provided to wife of his risk of aspiration would continue to compromise his breathing. Diet and modifications reviewed with wife, RN and CNA.    HPI HPI: 78 year old male admitted 01/20/2018 PMH: non-small cell lung cancer, COPD, emphysema, AFib, AVR, aneurysm, CHF, HTN, A&MVRegurg, HLD, recurrent PNA. Recent hospitalization for hypoxia and PNA amd acute metabolic encephalopathy. CXR = interstitial pulmonary edema and left lower lobe opacity which could reflect alveolar edema or pneumonia. SLE 10/16/17 =       SLP Plan  Continue with current plan of care       Recommendations  Diet recommendations: Regular;Nectar-thick liquid Liquids provided via: Cup;No straw Medication  Administration: Whole meds with puree Supervision: Trained caregiver to feed patient Compensations: Minimize environmental distractions;Slow rate;Small sips/bites;Follow solids with liquid;Multiple dry swallows after each bite/sip;Effortful swallow Postural Changes and/or Swallow Maneuvers: Seated upright 90 degrees                Plan: Continue with current plan of care       [place], MA, CCC-SLP 02/08/2018 10:56 AM

## 2018-02-08 NOTE — Progress Notes (Signed)
Nutrition Follow-up  DOCUMENTATION CODES:   Non-severe (moderate) malnutrition in context of chronic illness  INTERVENTION:  - Continue Magic Cup TID. - Continue Ensure Enlive BID. - Talked with Dr. Bonner Puna to recommend consideration of TF. Ongoing [place] discussions being had. - If TF within [place], recommend small bore NGT and to continue to allow patient to eat and drink PO.   Recommendations for TF:  - 30 ml Prostat TID with Osmolite 1.5 @ 25 ml/hr to advance by 10 ml every 12 hours to reach goal rate of 55 ml/hr. - Goal rate regimen will provide 2280 kcal, 128 grams of protein, and 1006 ml free water.  - Slow advancement d/t high risk of refeeding.    NUTRITION DIAGNOSIS:   Moderate Malnutrition related to chronic illness(CHF, COPD, lung cancer) as evidenced by energy intake < or equal to 75% for > or equal to 1 month, mild fat depletion, moderate fat depletion, mild muscle depletion, moderate muscle depletion, percent weight loss. 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 Fib, history of aortic valve replacement.  As noted he was recently discharged 3 days ago after hospitalization for hypoxia and pneumonia as well as acute metabolic encephalopathy due to those conditions.  At that hospitalization he ended up being discharged on oral antibiotics and with home oxygen therapy.  However this morning he removed his oxygen to go to the bathroom in the bathroom he developed left-sided chest pain and felt that he had a "heart trouble" additionally his wife Cody Lane reports to me that she is very concerned about his weakness which also prompted her  to bring him in for evaluation.  Weight fluctuations throughout hospitalization (admitted on 1/9) with current weight being the highest. Suspect this is 2/2 IVF and/or items being on the bed when patient was weighed.   Per chart review, patient has been eating 0-10% of meals since last assessment (1/22). For dinner last night he ate 20% which is the most that he has eaten in >2 weeks.   Patient's wife was at bedside and was feeding him lunch. She states that he did very well at dinner last night. She reports that for breakfast this AM he only ate a banana. Wife states that patient had chocolate Magic Cup at [employer]. Unfortunately chocolate is not available at [facility], but will make sure that he receives [employer] on all trays.  Talked with wife about ordering a variety of items at meals and offering food throughout the day, not only at meal times.     Medications reviewed. Labs reviewed; BUN: 45 mg/dl, Ca: 8.6 mg/dl. IVF; 1/2 NS @ 100 ml/hr.     Diet Order:   Diet Order            Diet Heart Room service appropriate? Yes; Fluid consistency: Thin  Diet effective now              EDUCATION NEEDS:   Education needs have been addressed  Skin:  Skin Assessment: Skin Integrity Issues: Skin Integrity Issues:: Other (Comment) Other: MASD groin  Last BM:  1/25  Height:   Ht Readings from Last 1 Encounters:  01/29/18 6' (  1.829 m)    Weight:   Wt Readings from Last 1 Encounters:  02/07/18 78.1 kg    Ideal Body Weight:  83.6 kg  BMI:  Body mass index is 23.35 kg/m.  Estimated Nutritional Needs:   Kcal:  7106-2694  Protein:  115-130 grams  Fluid:  > 2.2 L     Cody Matin, MS, RD, LDN, [facility] Inpatient Clinical Dietitian Pager # [phone] After hours/weekend pager # [phone]

## 2018-02-09 ENCOUNTER — Ambulatory Visit: Payer: Medicare Other

## 2018-02-09 ENCOUNTER — Inpatient Hospital Stay (HOSPITAL_COMMUNITY): Payer: No Typology Code available for payment source

## 2018-02-09 DIAGNOSIS — R0603 Acute respiratory distress: Secondary | ICD-10-CM

## 2018-02-09 LAB — CBC
HCT: 39.7 % (ref 39.0–52.0)
Hemoglobin: 11.8 g/dL — ABNORMAL LOW (ref 13.0–17.0)
MCH: 30 pg (ref 26.0–34.0)
MCHC: 29.7 g/dL — ABNORMAL LOW (ref 30.0–36.0)
MCV: 101 fL — ABNORMAL HIGH (ref 80.0–100.0)
PLATELETS: 123 10*3/uL — AB (ref 150–400)
RBC: 3.93 MIL/uL — AB (ref 4.22–5.81)
RDW: 14 % (ref 11.5–15.5)
WBC: 10.6 10*3/uL — ABNORMAL HIGH (ref 4.0–10.5)
nRBC: 0 % (ref 0.0–0.2)

## 2018-02-09 LAB — GRAM STAIN: Gram Stain: NONE SEEN

## 2018-02-09 LAB — BASIC METABOLIC PANEL
Anion gap: 7 (ref 5–15)
BUN: 47 mg/dL — ABNORMAL HIGH (ref 8–23)
CALCIUM: 9 mg/dL (ref 8.9–10.3)
CO2: 26 mmol/L (ref 22–32)
Chloride: 100 mmol/L (ref 98–111)
Creatinine, Ser: 1.55 mg/dL — ABNORMAL HIGH (ref 0.61–1.24)
GFR calc Af Amer: 49 mL/min — ABNORMAL LOW (ref >60)
GFR calc non Af Amer: 43 mL/min — ABNORMAL LOW (ref >60)
Glucose, Bld: 132 mg/dL — ABNORMAL HIGH (ref 70–99)
Potassium: 5.6 mmol/L — ABNORMAL HIGH (ref 3.5–5.1)
Sodium: 133 mmol/L — ABNORMAL LOW (ref 135–145)

## 2018-02-09 LAB — MRSA PCR SCREENING: MRSA BY PCR: NEGATIVE

## 2018-02-09 MED ORDER — IPRATROPIUM BROMIDE 0.02 % IN SOLN
0.5000 mg | Freq: Four times a day (QID) | RESPIRATORY_TRACT | Status: DC
  Administered 2018-02-09 – 2018-02-10 (×6): 0.5 mg via RESPIRATORY_TRACT
  Filled 2018-02-09 (×8): qty 2.5

## 2018-02-09 MED ORDER — LEVALBUTEROL HCL 0.63 MG/3ML IN NEBU
0.6300 mg | INHALATION_SOLUTION | Freq: Four times a day (QID) | RESPIRATORY_TRACT | Status: DC
  Administered 2018-02-09 – 2018-02-10 (×6): 0.63 mg via RESPIRATORY_TRACT
  Filled 2018-02-09 (×8): qty 3

## 2018-02-09 MED ORDER — DEXTROSE 5 % IV SOLN
INTRAVENOUS | Status: DC
  Administered 2018-02-09 – 2018-02-12 (×7): via INTRAVENOUS

## 2018-02-09 MED ORDER — IPRATROPIUM BROMIDE 0.02 % IN SOLN: 0.5000 mg | Freq: Four times a day (QID) | RESPIRATORY_TRACT | Status: DC

## 2018-02-09 MED ORDER — CHLORHEXIDINE GLUCONATE 0.12 % MT SOLN
15.0000 mL | Freq: Two times a day (BID) | OROMUCOSAL | Status: DC
  Administered 2018-02-09 – 2018-02-11 (×5): 15 mL via OROMUCOSAL
  Filled 2018-02-09 (×4): qty 15

## 2018-02-09 MED ORDER — LIDOCAINE HCL 1 % IJ SOLN
INTRAMUSCULAR | Status: AC
  Filled 2018-02-09: qty 20

## 2018-02-09 NOTE — Significant Event (Addendum)
Brief HPI: Mr. Loy is an unfortunate man who was admitted with SOB and found to have acute hypoxic respiratory failure secondary to acute on chronic CHF, known lung cancer, COPD and CAP. He was seen in the ED at [facility] on 01/20/2018 and transferred to [facility] so that he could get palliative radiation. He is a full code and originally denied palliative care consult. His hospital stay has been complicated by multiple issues, including sub acute CVA, malignant pleural effusion (drained while at [facility] before transfer to [facility]), hypotension, Afib with RVR, confusion and dysphagia.  Shift event: RN paged NP because pt had increased WOB, tachypnea, use of accessory muscles, and increased O2 demand. RN had increased O2 to 4L. RT came in to give neb tx and increased pt to 6L. His O2 saturation corrected, but he continued to have increased WOB. CXR ordered. NP to bedside. S: Pt can not participate in ROS due to mental status. Apparently, he is usually oriented to person and answers some yes and no questions. Per RN, he has been awake but basically non verbal for past hour or so. This worsened as his WOB increased.  O: Poor appearing acute on chronically ill male in mild respiratory distress. He has his eyes open but does not answer questions or follow commands. He is calm. BP 120s. RR 30 Afebrile O2 sat mid 90s. Lungs: increased WOB with use of abdominal muscles and tachypnea. O2 sat corrected. Lung sounds with fair to good air exchange on the right, but little to no air exchange on the left. Congested cough which is non productive.  A/P: 1. Hypoxic respiratory failure-this is multifactorial including emphysema, CAP, and lung cancer, but is acutely due to malignant pleural effusion. The entire left side of lungs is whited out. He already has thoracentesis during his stay [facility]. Pt was transferred to SDU and placed on Bipap. He is on Solumedrol and now on Atrovent and Xopenex nebs.  2. Afib with RVR-stable now, but pt  is unable to take po meds. He may need rate control per IV. He is on Metoprolol IV prn now, but was on it po along with Pacerone and Cardizem CD.  3. Lung cancer-was sent to [facility] for palliative radiation. Unsure of further plan. Likely, oncology may suggest palliative care/hospice at this point as he continues to decline. Wife states he was told he is too "weak" for chemo when diagnosed with lung cancer in Nov 2019.  4. Poor nutritional status secondary to dysphagia and now NPO-change IVF to D5W for some nutritional value. He likely would be a poor candidate for a PEG and tube feeds.  5. Hx subacute stroke-stable, but just adds to more decreased quality of life. Called wife to update her and she is coming to hospital. Will speak to her in person.   Conversation with wife: Pt's sister was also present during this conversation. Pt is a full code now and had originally declined DNR/palliative care. However, wife states that he probably didn't understand what all of that meant when approached in the ED. Wife is realistic and does not want her husband to suffer. She states that he has had a lot of additional problems in the hospital after the original code status decision was made. NP informed her that now that pt can not speak for himself, that she becomes the decision maker. NP spoke to wife about pleural effusion and treatment and it's relation to lung cancer. We spoke of quality of life and how  with every problem, his quality of life is decreased. We spoke of options of treatment and she was informed that the oncologist would be the one to address the further treatment of cancer and weigh in on these pleural effusions. NP basically went over each problem the pt has and how they affect his prognosis and the plan of care. NP suggested palliative care consult which she is open to. NP brought up the subject of Hospice care should she and providers decide not to go further with treatments. NP discussed thoroughly the  concept of full code vs DNR. She is a very informed lady and reasonable. She states she does not want her husband to suffer and that now it is "all about him". She would like to speak to specialists this a.m. before she makes any decisions, even about code status, as she wants to be informed as possible. NP told her this was entirely OK and that NP understood as tonight was an acute event that she suddenly had to deal with. She is in agreement with a DNR, but does not want NP to change the code status just yet. This entire conversation lasted about an hour.    Total critical care time: start: 0330 end: 0430 For a total of 60 minutes plus start 0500 end 0600 for 60 mins for total of 120 minutes. Part of this time was a long discussion with the wife about the current findings and treatment plan with counseling about plan of care and code status. Critical care time was exclusive of separately billable procedures and treating other patients. Critical care was necessary to treat or prevent imminent or life-threatening deterioration. Critical care was time spent personally by me on the following activities: development of treatment plan with patient and/or surrogate as well as nursing, discussions with consultants, evaluation of patient's response to treatment, examination of patient, obtaining history from patient or surrogate, ordering and performing treatments and interventions, ordering and review of laboratory studies, ordering and review of radiographic studies, pulse oximetry and re-evaluation of patient's condition. KJKG, NP [facility]

## 2018-02-09 NOTE — Treatment Plan (Signed)
Patient seen and examined. Full note will follow. Overnight events noted. Patient decompensated and now on bipap. Met with pt's family and wife at bedside. Wife aware that mechanical ventilation and CPR would not resolve patient's current condition and would not allow for maintining patient's dignity. As such, pt's wishes are now DNR/DNI. For now, patient noted to have large L sided effusion. Will request US guided thoracentesis primarily for comfort pending discussion with Palliative Care.

## 2018-02-09 NOTE — Progress Notes (Signed)
PROGRESS NOTE    Cody Lane  MGQ:[id number] DOB: 01/01/1941 DOA: 01/20/2018 PCP: [name]    Brief Narrative:  78 year old male with a history of emphysematous COPD, systolic CHF, atrial fibrillation, recent CVA who is admitted for acute hypoxic respiratory failure. 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 He was in the process of initiating chemotherapy through the [place] system but this was deferred because he became hypoxemic and was hospitalized on these occasions. He was recently discharged and readmitted to [facility] acute hypoxic respiratory failure secondary to acute on chronic systolic CHF, knownlung cancer, chronic COPD, and community-acquired pneumonia. He completed a full course of antibiotics for CAP and has been diuresed for pulmonary edema. He underwent a thoracentesis with 900 cc of fluid removed. 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 Despite removal of fluid he continues to have increased supplemental oxygen requirements. Pulmonology and radiation oncology both consulted and recommended transfer to [facility] for palliative radiation.   His hospital course has been complicated by by hypotension, persistent atrial fibrillation with RVR, intermittent confusion, difficulty swallowing, and subacute stroke.  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 (HCC) Active Problems:   Essential hypertension   COPD (chronic obstructive pulmonary disease) with emphysema (HCC)   Chronic respiratory failure with hypoxia, on home O2 therapy (HCC)   Acute on chronic systolic heart failure (HCC)   Physical deconditioning   CAP (community acquired pneumonia)   Malnutrition of moderate degree   Goals of care,  counseling/discussion   Palliative care by specialist   Paroxysmal atrial fibrillation (HCC)   Pleural effusion   S/P thoracentesis  Acute hypoxic respiratory failure secondary to COPD exacerbation, acute on chronic systolic CHF, non-small cell lung cancer, malignant pleural effusion: Was also hospitalized at [facility] 12/31 - 1/6 for pneumonia where AFib was difficult to control as well. - Continue oxygen prn.  - Treat contributing conditions as below. - Overnight, pt decompensated with increased O2 requirements, on BiPAP this AM - CXR personally reviewed. Findings of L sided white out, concerns for pleural effusion. Have requested US guided thoracentesis primarily for comfort - Appreciate assistance by Palliative Care. See note earlier. Pt now DNR/DNI  Emphysematous/bullous COPD:  - Continue BD's (xopenex). - On bronchodilators  Squamous cell NSCLC: Per family, was slated to start chemotherapy 12/26 and was held due to hypoxia.   - XRT was started 01/31/2018 with plans for 2 weeks per Dr. Lisbeth Renshaw with aim of improving respiratory status. Continuing daily M-F. - Has referral to Dr. Julien Nordmann, establish care appointment 2/3.  - With worsening renal function, likely unable to tolerate chemo. Discussed with on-call Oncologist as well as pt's primary Oncologist - [place] per above  PAF with RVR: Rate still intermittently elevated. - Continue eliquis and continue metoprolol 100mg  po BID.  - Continue prn metoprolol IV - Increased diltiazem though with HFrEF, do not want to titrate dose too high.  - Presently on amiodarone. TSH recently 1.65. K and Mg at goal.  Acute on chronic HFrEF:  - Holding lasix 40mg  po daily home dose with AKI and ongoing little po intake, in fact will continue supporting with sub-maintenance IV fluids, no peripheral overload or crackles noted. - Cr worsened today. 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  - Continue eliquis.  - Not giving statin to reduce po burden - Continue PT, recommending SNF. - No CES on echocardiogram - Stable at present  Dysphagia: Reviewed MBS video images with patient and wife at bedside. SLP evaluations ongoing.  - Regular diet to encourage po intake per lengthy goals of care discussion at bedside today. This places at risk of aspiration but is best chance to augment nutritional intake.  - Must follow strict adjustments to minimize risk of aspiration including small bites, alt fluid/solid, chin tuck, frequent clearance, etc.  Acute toxic metabolic encephalopathy: Suspect due to narcotic administration. - Recently decreased dilaudid dose significantly.  - Delirium precautions  Abdominal pain: - Stable at present  Medication nonadherence: This predates this admission per his wife, though it has gotten worse and is actively impacting his care. We discussed the incongruence of wanting full life saving measures in a code situation but refusing medications at this time.  - Patient was given encouragement to take medications, and will continue to seek ways to help with this.   HTN: BP not elevated. - Monitor on medications as above. Holding lisinopril, lasix, and norvasc  AKI:  - Worsened today - Minimal Urine output noted. Will check bladder scan - BUN trending up - continued on D5 at 100cc/hr - Repeat bmet in AM  Hypernatremia: Improved with hypotonic saline  Moderate protein calorie malnutrition:  - Supplement with magic cup (prefers chocolate, won't eat anything else), ensure enlive per dietitian recommendations. Will continue to offer this. He will not accept prostat, will discontinue. - Discussed with dietitian today. Consideration for alternative means of nutrition, however given worsening state, hold off for now  Hyperlipidemia:  - Continue to hold statin for now to decrease pill  burden  Allergies:  - Hold antihistamine to decrease pill burden  DVT prophylaxis: eliquis Code Status: DNR Family Communication: Patient in room, family at bedside Disposition Plan: Uncertain at this time  Consultants:   Oncology  Palliative Care  Radiation Oncology  Procedures:     Antimicrobials: Anti-infectives (From admission, onward)   Start     Dose/Rate Route Frequency Ordered Stop   01/20/18 2200  cefdinir (OMNICEF) capsule 300 mg  Status:  Discontinued     300 mg Oral 2 times daily 01/20/18 1604 01/20/18 1608   01/20/18 1700  cefdinir (OMNICEF) capsule 300 mg  Status:  Discontinued     300 mg Oral 2 times daily 01/20/18 1608 01/22/18 0704       Subjective: Unable to assess as pt is on bipap  Objective: Vitals:   02/09/18 0500 02/09/18 0733 02/09/18 0800 02/09/18 1200  BP: (!) 110/92  (!) 131/104 126/78  Pulse: (!) 102 (!) 113 (!) 103 (!) 119  Resp: 20 (!) 31 (!) 30 (!) 30  Temp:   98.2 F (36.8 C) 98.2 F (36.8 C)  TempSrc:   Oral Axillary  SpO2: 94% 95% 91% 97%  Weight:      Height:        Intake/Output Summary (Last 24 hours) at 02/09/2018 1402 Last data filed at 02/09/2018 0800 Gross per 24 hour  Intake 2004.11 ml  Output 325 ml  Net 1679.11 ml   Filed Weights   02/06/18 0629 02/07/18 0449 02/09/18 0410  Weight: 75.2 kg 78.1 kg 79 kg    Examination:  General exam: Appears calm and comfortable on bipap  Respiratory system: decreased BS, on  bipap Cardiovascular system: S1 & S2 heard, RRR Gastrointestinal system: Abdomen is nondistended, soft and nontender. No organomegaly or masses felt. Normal bowel sounds heard. Central nervous system: Alert and oriented. No focal neurological deficits. Extremities: Symmetric 5 x 5 power. Skin: No rashes, lesions  Psychiatry: Unable to assess given pt's mental acuity   Data Reviewed: I have personally reviewed following labs and imaging studies  CBC: Recent Labs  Lab 02/03/18 0623  02/05/18 0540 02/07/18 0516 02/09/18 0428  WBC 13.2* 12.8* 12.5* 10.6*  HGB 12.0* 12.4* 12.2* 11.8*  HCT 39.9 40.8 41.1 39.7  MCV 99.8 98.1 99.0 101.0*  PLT 108* 157 168 263*   Basic Metabolic Panel: Recent Labs  Lab 02/03/18 0623 02/04/18 0539 02/05/18 0540 02/06/18 0523 02/07/18 0516 02/09/18 0428  NA 140 140 137 137 135 133*  K 4.8 4.2 4.2 4.3 4.3 5.6*  CL 102 103 102 101 102 100  CO2 28 27 25 26 22 26   GLUCOSE 103* 98 98 100* 101* 132*  BUN 57* 49* 41* 47* 45* 47*  CREATININE 1.34* 1.22 1.12 1.29* 1.22 1.55*  CALCIUM 8.7* 8.7* 8.5* 8.7* 8.6* 9.0  MG 2.3  --   --   --   --   --    GFR: Estimated Creatinine Clearance: 43.8 mL/min (A) (by C-G formula based on SCr of 1.55 mg/dL (H)). Liver Function Tests: No results for input(s): AST, ALT, ALKPHOS, BILITOT, PROT, ALBUMIN in the last 168 hours. No results for input(s): LIPASE, AMYLASE in the last 168 hours. No results for input(s): AMMONIA in the last 168 hours. Coagulation Profile: No results for input(s): INR, PROTIME in the last 168 hours. Cardiac Enzymes: No results for input(s): CKTOTAL, CKMB, CKMBINDEX, TROPONINI in the last 168 hours. BNP (last 3 results) No results for input(s): PROBNP in the last 8760 hours. HbA1C: No results for input(s): HGBA1C in the last 72 hours. CBG: No results for input(s): GLUCAP in the last 168 hours. Lipid Profile: No results for input(s): CHOL, HDL, LDLCALC, TRIG, CHOLHDL, LDLDIRECT in the last 72 hours. Thyroid Function Tests: No results for input(s): TSH, T4TOTAL, FREET4, T3FREE, THYROIDAB in the last 72 hours. Anemia Panel: No results for input(s): VITAMINB12, FOLATE, FERRITIN, TIBC, IRON, RETICCTPCT in the last 72 hours. Sepsis Labs: No results for input(s): PROCALCITON, LATICACIDVEN in the last 168 hours.  Recent Results (from the past 240 hour(s))  MRSA PCR Screening     Status: None   Collection Time: 02/09/18  4:07 AM  Result Value Ref Range Status   MRSA by PCR  NEGATIVE NEGATIVE Final    Comment:        The GeneXpert MRSA Assay (FDA approved for NASAL specimens only), is one component of a comprehensive MRSA colonization surveillance program. It is not intended to diagnose MRSA infection nor to guide or monitor treatment for MRSA infections. Performed at [facility], [place] [address], [place], [place] [address]      Radiology Studies: Dg Chest Port 1 View  Result Date: 02/09/2018 CLINICAL DATA:  Pleural effusion EXAM: PORTABLE CHEST 1 VIEW COMPARISON:  02/04/2018, 01/31/2018, 01/26/2018 FINDINGS: Post sternotomy changes. Interval white out of the left hemithorax. Development of right pleural effusion with right basilar airspace disease. Cardiomediastinal silhouette is largely obscured. IMPRESSION: Interval complete opacification of the left thorax which may be secondary to large effusion and or combination of effusion and underlying parenchymal lung disease. Development of at least small right pleural effusion with right basilar atelectasis or infiltrate. Electronically  Signed   By: Donavan Foil M.D.   On: 02/09/2018 03:01    Scheduled Meds: . amiodarone  200 mg Oral BID  . apixaban  5 mg Oral BID  . chlorhexidine  15 mL Mouth Rinse BID  . diltiazem  240 mg Oral Daily  . feeding supplement (ENSURE ENLIVE)  237 mL Oral BID BM  . ipratropium  0.5 mg Nebulization Q6H  . levalbuterol  0.63 mg Nebulization Q6H  . mouth rinse  15 mL Mouth Rinse BID  . methylPREDNISolone (SOLU-MEDROL) injection  40 mg Intravenous Daily  . metoprolol tartrate  100 mg Oral BID  . sodium chloride flush  10-40 mL Intracatheter Q12H   Continuous Infusions: . dextrose       LOS: 20 days   Marylu Lund, MD [facility] Pager On Amion  If 7PM-7AM, please contact night-coverage 02/09/2018, 2:02 PM

## 2018-02-09 NOTE — Progress Notes (Signed)
Daily Progress Note   Patient Name: Cody Lane       Date: 02/09/2018 DOB: 1940-06-12  Age: 78 y.o. MRN#: [id number] Attending Physician: Donne Hazel, MD Primary Care Physician: [name] Admit Date: 01/20/2018  Reason for Consultation/Follow-up: Establishing goals of care  Subjective:  Patient is lying in bed.  On bipap.  Opens eyes with encouragement. Does not engage otherwise or attempt to participate. Denies pain.   Chart reviewed  Discussed with wife at bedside.   Length of Stay: 20  Current Medications: Scheduled Meds:  . amiodarone  200 mg Oral BID  . apixaban  5 mg Oral BID  . chlorhexidine  15 mL Mouth Rinse BID  . diltiazem  240 mg Oral Daily  . feeding supplement (ENSURE ENLIVE)  237 mL Oral BID BM  . ipratropium  0.5 mg Nebulization Q6H  . levalbuterol  0.63 mg Nebulization Q6H  . lidocaine      . mouth rinse  15 mL Mouth Rinse BID  . methylPREDNISolone (SOLU-MEDROL) injection  40 mg Intravenous Daily  . metoprolol tartrate  100 mg Oral BID  . sodium chloride flush  10-40 mL Intracatheter Q12H    Continuous Infusions: . dextrose 100 mL/hr at 02/09/18 1624    PRN Meds: acetaminophen **OR** acetaminophen, HYDROmorphone (DILAUDID) injection, metoprolol tartrate, ondansetron (ZOFRAN) IV, RESOURCE THICKENUP CLEAR, senna-docusate, sodium chloride flush  Physical Exam         Weak appearing gentleman Diminished breath sound No abdominal pain No edema Sleepy but will open eyes on command. Generalized weakness evident  Vital Signs: BP (!) 119/96 (BP Location: Left Arm)   Pulse (!) 114   Temp 98 F (36.7 C) (Axillary)   Resp 14   Ht 6' (1.829 m)   Wt 79 kg   SpO2 97%   BMI 23.62 kg/m  SpO2: SpO2: 97 % O2 Device: O2 Device: Bi-PAP O2  Flow Rate: O2 Flow Rate (L/min): 6 L/min  Intake/output summary:   Intake/Output Summary (Last 24 hours) at 02/09/2018 1819 Last data filed at 02/09/2018 1700 Gross per 24 hour  Intake 2064.11 ml  Output 325 ml  Net 1739.11 ml   LBM: Last BM Date: 02/05/18 Baseline Weight: Weight: 76.2 kg Most recent weight: Weight: 79 kg      PPS 30% Palliative Assessment/Data:  Flowsheet Rows     Most Recent Value  Intake Tab  Referral Department  Hospitalist  Unit at Time of Referral  Intermediate Care Unit  Palliative Care Primary Diagnosis  Pulmonary  Date Notified  01/20/18  Palliative Care Type  New Palliative care  Reason for referral  Clarify Goals of Care, Psychosocial or Spiritual support  Date of Admission  01/20/18  Date first seen by Palliative Care  01/21/18  # of days Palliative referral response time  1 Day(s)  # of days IP prior to Palliative referral  0  Clinical Assessment  Pain Max last 24 hours  Not able to report  Pain Min Last 24 hours  Not able to report  Dyspnea Max Last 24 Hours  Not able to report  Dyspnea Min Last 24 hours  Not able to report  Nausea Max Last 24 Hours  Not able to report  Nausea Min Last 24 Hours  Not able to report  Anxiety Max Last 24 Hours  Not able to report  Anxiety Min Last 24 Hours  Not able to report  Other Max Last 24 Hours  Not able to report  Psychosocial & Spiritual Assessment  Palliative Care Outcomes  Patient/Family meeting held?  Yes  Who was at the meeting?  pt and wife  Palliative Care follow-up planned  Yes, Facility      Patient Active Problem List   Diagnosis Date Noted  . S/P thoracentesis   . Pleural effusion   . Paroxysmal atrial fibrillation (HCC)   . Acute on chronic systolic heart failure ([place]) 01/21/2018  . Physical deconditioning 01/21/2018  . CAP (community acquired pneumonia) 01/21/2018  . Malnutrition of moderate degree 01/21/2018  . Goals of care, counseling/discussion   . Palliative care by  specialist   . Chest pain 01/20/2018  . 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 COPD (chronic obstructive pulmonary disease) with emphysema ([place]) 01/20/2018  . Chronic respiratory failure with hypoxia, on home O2 therapy ([place]) 01/20/2018  . Vertigo 10/15/2017  . Diplopia 10/15/2017  . Cough   . Aortic valve insufficiency S/P aortic valve replacement 03/26/2014  . Kidney mass 03/26/2014  . Essential hypertension 03/26/2014  . Permanent atrial fibrillation 03/26/2014  . Long term current use of anticoagulant therapy 03/26/2014  . Hyperlipidemia 03/26/2014  . CHF (congestive heart failure) ([place]) 03/26/2014  . Insomnia 03/26/2014    Palliative Care Assessment & Plan   Patient Profile:  78 year old male with a history of emphysematous COPD, systolic CHF, atrial fibrillation, recent CVA who is admitted for acute hypoxic respiratory failure. 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 He was in the process of initiating chemotherapy through the [place] system but this was deferred because he became hypoxemic and was hospitalized.   He was recently discharged and readmitted to [facility] acute hypoxic respiratory failure secondary to acute on chronic systolic CHF, knownlung cancer, chronic COPD, and community-acquired pneumonia. He completed a full course of antibiotics for CAP and has been diuresed for pulmonary edema. He underwent a thoracentesis with 900 cc of fluid removed. 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 Despite removal of fluid he continued to have increased supplemental oxygen requirements. Pulmonology and radiation oncology both consulted and recommendeded transfer to [facility] for palliative radiation.   His hospital course  has been  complicated by by hypotension, persistent atrial fibrillation with RVR, intermittent confusion, difficulty swallowing, and subacute stroke.  Assessment:  ongoing functional decline Ongoing dysphagia Ongoing cancer related cachexia Patient refusing radiation/other treatments at times Abdominal pain.   Recommendations/Plan:  Continue IV dilaudid to be used PRN.  Now DNR/DNI  For now, continue current interventions.  Plan to attempt thoracentesis.  Wife reports needing to see how he does prior to making further decisions regarding long term goals.  I called and discussed with radiation oncology and plan to hold radiation for today.    PMT to follow to progress goals conversation based on clinical course.     Goals of Care and Additional Recommendations:  Limitations on Scope of Treatment: Full Scope Treatment  Code Status:    Code Status Orders  (From admission, onward)         Start     Ordered   01/20/18 1429  Full code  Continuous     01/20/18 1428        Code Status History    Date Active Date Inactive Code Status Order ID Comments User Context   10/15/2017 2152 10/18/2017 1947 Full Code [id number]  Rise Patience, MD ED       Prognosis:   guarded  Discharge Planning:  To Be Determined  Care plan was discussed with  Patient's wife   Thank you for allowing the Palliative Medicine Team to assist in the care of this patient.   Total Time 30 Prolonged Time Billed  no       Greater than 50%  of this time was spent counseling and coordinating care related to the above assessment and plan.  Micheline Rough, MD  Please contact Palliative Medicine Team phone at [phone] for questions and concerns.

## 2018-02-09 NOTE — Care Management Note (Signed)
Case Management Note  Patient Details  Name: Cody Lane MRN: [id number] Date of Birth: [birth year]  Subjective/Objective:                  Discharge Readiness Return to top of Heart Failure RRG - [place]  Discharge readiness is indicated by patient meeting Recovery Milestones, including ALL of the following: ? Hemodynamic stability hr=113, rr=31 on bipap at 30% ? MI excluded n/a ? Cardiac rate and rhythm acceptable no hr =113 ? Tachypnea absent rr-31 on bipap ? Immediate precipitating factors absent or controlled  Hypoxic respiratory failure-this is multifactorial including emphysema, CAP, and lung cancer, but is acutely due to malignant pleural effusion. The entire left side of lungs is whited out. He already has thoracentesis during his stay at [facility]. Pt was transferred to SDU and placed on Bipap. He is on Solumedrol and now on Atrovent and Xopenex nebs. Pulmonary edema absent or acceptable for next level of care [id number]=cxr=IMPRESSION: Interval complete opacification of the left thorax which may be secondary to large effusion and or combination of effusion and underlying parenchymal lung disease. Development of at least small ? right pleural effusion with right basilar atelectasis or infiltrate. ? Peripheral or sacral edema absent, at baseline, or improved no  ? Volume status acceptable on oral medication no ? Oxygenation at baseline or acceptable for next level of care BiPap ? Renal function at baseline or acceptable for next level of care bun=47/creat=1.55 ? Electrolyte levels normal or acceptable for next level of care na 133/k+=5.6 ? Mental status at baseline no confused ? Oral hydration, medications, and diet  ? Npo/ iv soul-medrol ? Ambulatory no ? Level of care-sdu   Action/Plan: Following for progression of care. Following for cm needs none present at this time.  Expected Discharge Date:                  Expected Discharge Plan:  Skilled Nursing Facility  In-House  Referral:  Clinical Social Work  Discharge planning Services  CM Consult  Post Acute Care Choice:  Home Health(Active w/[facility] HHRN/PT) Choice offered to:     DME Arranged:    DME Agency:     HH Arranged:    [place] Agency:     Status of Service:  In process, will continue to follow  If discussed at Long Length of Stay Meetings, dates discussed:    Additional Comments:  Leeroy Cha, RN 02/09/2018, 9:31 AM

## 2018-02-09 NOTE — Progress Notes (Signed)
SLP Cancellation Note  Patient Details Name: Cody Lane MRN: [id number] DOB: 10-12-1940   Cancelled treatment:       Reason Eval/Treat Not Completed: Other (comment);Medical issues which prohibited therapy(will sign off as pt in ICU, and requiring bipap)   Cody Lane 02/09/2018, 2:23 PM  Luanna Salk, [facility] SLP Acute Rehab Services Pager [phone] Office [phone]

## 2018-02-09 NOTE — Procedures (Signed)
PROCEDURE SUMMARY:  Successful US guided left thoracentesis. Yielded 2.1 L of hazy yellow fluid. Patient tolerated procedure well. No immediate complications. EBL = trace  Post procedure chest X-ray reveals no pneumothorax  WENDY S BLAIR PA-C 02/09/2018 3:45 PM

## 2018-02-09 NOTE — Progress Notes (Signed)
Patient with increased work of breathing with use of accessory muscles. Pt is only alert to self and unable to follow simple commands. Pt respiratory rate in upper 20's. NP on call paged. New order placed. NP arrived to bedside to assess patient after cxr resulted. After assessment NP placed order to transfer patient SDU.

## 2018-02-10 ENCOUNTER — Ambulatory Visit: Payer: Medicare Other

## 2018-02-10 LAB — CBC
HCT: 34.4 % — ABNORMAL LOW (ref 39.0–52.0)
Hemoglobin: 10.5 g/dL — ABNORMAL LOW (ref 13.0–17.0)
MCH: 30.2 pg (ref 26.0–34.0)
MCHC: 30.5 g/dL (ref 30.0–36.0)
MCV: 98.9 fL (ref 80.0–100.0)
Platelets: 94 10*3/uL — ABNORMAL LOW (ref 150–400)
RBC: 3.48 MIL/uL — ABNORMAL LOW (ref 4.22–5.81)
RDW: 14.4 % (ref 11.5–15.5)
WBC: 11.9 10*3/uL — ABNORMAL HIGH (ref 4.0–10.5)
nRBC: 0 % (ref 0.0–0.2)

## 2018-02-10 LAB — BASIC METABOLIC PANEL
Anion gap: 7 (ref 5–15)
BUN: 42 mg/dL — ABNORMAL HIGH (ref 8–23)
CALCIUM: 8.9 mg/dL (ref 8.9–10.3)
CO2: 25 mmol/L (ref 22–32)
Chloride: 101 mmol/L (ref 98–111)
Creatinine, Ser: 1.36 mg/dL — ABNORMAL HIGH (ref 0.61–1.24)
GFR calc Af Amer: 58 mL/min — ABNORMAL LOW (ref >60)
GFR calc non Af Amer: 50 mL/min — ABNORMAL LOW (ref >60)
Glucose, Bld: 134 mg/dL — ABNORMAL HIGH (ref 70–99)
Potassium: 4.1 mmol/L (ref 3.5–5.1)
SODIUM: 133 mmol/L — AB (ref 135–145)

## 2018-02-10 NOTE — Progress Notes (Signed)
Nutrition Follow-up  DOCUMENTATION CODES:   Non-severe (moderate) malnutrition in context of chronic illness  INTERVENTION:  - Will continue to monitor Cody Lane and medical course.   NUTRITION DIAGNOSIS:   Moderate Malnutrition related to chronic illness(CHF, COPD, lung cancer) as evidenced by energy intake < or equal to 75% for > or equal to 1 month, mild fat depletion, moderate fat depletion, mild muscle depletion, moderate muscle depletion, percent weight loss. -ongoing  GOAL:   Patient will meet greater than or equal to 90% of their needs -unmet/unable to meet  MONITOR:   Diet advancement, Weight trends, Labs  ASSESSMENT:   78 year old male with a past medical history significant for non-small cell lung cancer that has been recently diagnosed, COPD with emphysema, chronic congestive heart failure with reduced ejection fraction, A. Fib, history of aortic valve replacement.  As noted he was recently discharged 3 days ago after hospitalization for hypoxia and pneumonia as well as acute metabolic encephalopathy due to those conditions.  At that hospitalization he ended up being discharged on oral antibiotics and with home oxygen therapy.  However this morning he removed his oxygen to go to the bathroom in the bathroom he developed left-sided chest pain and felt that he had a "heart trouble" additionally his wife Cody Lane reports to me that she is very concerned about his weakness which also prompted her to bring him in for evaluation.  Weight has been stable 1/28-1/30. Patient transferred to 2W from 4E overnight 1/29 d/t respiratory distress. Patient remains on BiPAP and is now NPO. Patient is now DNR/DNI. SLP signed off 1/29. Patient is s/p thoracentesis on 1/29 with 2.1L removed.    Medications reviewed; 40 mg solu-medrol/day. Labs reviewed; Na: 133 mmol/l, BUN: 42 mg/dl, creatinine: 1.36 mg/dl, GFR: 58 ml/min. IVF; D5 @ 100 ml/hr (306 kcal).    Diet Order:   Diet Order           Diet NPO time specified  Diet effective now              EDUCATION NEEDS:   Education needs have been addressed  Skin:  Skin Assessment: Skin Integrity Issues: Skin Integrity Issues:: Other (Comment) Other: MASD groin  Last BM:  1/29  Height:   Ht Readings from Last 1 Encounters:  02/09/18 6' (1.829 m)    Weight:   Wt Readings from Last 1 Encounters:  02/10/18 78.3 kg    Ideal Body Weight:  83.6 kg  BMI:  Body mass index is 23.41 kg/m.  Estimated Nutritional Needs:   Kcal:  3009-2330  Protein:  115-130 grams  Fluid:  > 2.2 L     Cody Matin, MS, RD, LDN, [facility] Inpatient Clinical Dietitian Pager # [phone] After hours/weekend pager # [phone]

## 2018-02-10 NOTE — Progress Notes (Signed)
PROGRESS NOTE    Cody Lane  DVV:[id number] DOB: November 22, 1940 DOA: 01/20/2018 PCP: [name]    Brief Narrative:  78 year old male with a history of emphysematous COPD, systolic CHF, atrial fibrillation, recent CVA who is admitted for acute hypoxic respiratory failure. 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 He was in the process of initiating chemotherapy through the [place] system but this was deferred because he became hypoxemic and was hospitalized on these occasions. He was recently discharged and readmitted to [facility] acute hypoxic respiratory failure secondary to acute on chronic systolic CHF, knownlung cancer, chronic COPD, and community-acquired pneumonia. He completed a full course of antibiotics for CAP and has been diuresed for pulmonary edema. He underwent a thoracentesis with 900 cc of fluid removed. 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 Despite removal of fluid he continues to have increased supplemental oxygen requirements. Pulmonology and radiation oncology both consulted and recommended transfer to [facility] for palliative radiation.   His hospital course has been complicated by by hypotension, persistent atrial fibrillation with RVR, intermittent confusion, difficulty swallowing, and subacute stroke.  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 (HCC) Active Problems:   Essential hypertension   COPD (chronic obstructive pulmonary disease) with emphysema (HCC)   Chronic respiratory failure with hypoxia, on home O2 therapy (HCC)   Acute on chronic systolic heart failure (HCC)   Physical deconditioning   CAP (community acquired pneumonia)   Malnutrition of moderate degree   Goals of care,  counseling/discussion   Palliative care by specialist   Paroxysmal atrial fibrillation (HCC)   Pleural effusion   S/P thoracentesis  Acute hypoxic respiratory failure secondary to COPD exacerbation, acute on chronic systolic CHF, non-small cell lung cancer, malignant pleural effusion: Was also hospitalized at [facility] 12/31 - 1/6 for pneumonia where AFib was difficult to control as well. - Treat contributing conditions as below. - Recently noted to have decompensated with increased O2 requirements, needing bipap - Recent CXR personally reviewed. Findings of L sided white out, concerns for pleural effusion. Have requested US guided thoracentesis primarily for comfort - Appreciate assistance by Palliative Care. See note earlier. Pt now DNR/DNI - [id number] as tolerated. At present, off bipap  Emphysematous/bullous COPD:  - Continue BD's (xopenex). - On bronchodilators as tolerated  Squamous cell NSCLC: Per family, was slated to start chemotherapy 12/26 and was held due to hypoxia.   - XRT was started 01/31/2018 with plans for 2 weeks per Dr. Lisbeth Renshaw with aim of improving respiratory status. Continuing daily M-F. - Has referral to Dr. Julien Nordmann, establish care appointment 2/3.  - With worsening renal function, likely unable to tolerate chemo. Discussed with on-call Oncologist as well as pt's primary Oncologist - [place] per above  PAF with RVR: Rate still intermittently elevated. - Continue eliquis and continue metoprolol 100mg  po BID.  - Continue prn metoprolol IV - Increased diltiazem though with HFrEF, do not want to titrate dose too high.  - Presently on amiodarone. TSH recently 1.65. K and Mg at goal.  Acute on chronic HFrEF:  - Holding lasix 40mg  po daily home dose with AKI and ongoing little po intake, in fact will continue supporting with sub-maintenance IV fluids, no peripheral overload or crackles noted. - Cr recently worsened, on  IVF. 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  - Continued on eliquis.  - Not giving statin to reduce po burden - PT earlier recommended SNF - No CES on echocardiogram - Stable at present  Dysphagia: Reviewed MBS video images with patient and wife at bedside. SLP evaluations ongoing.  - Regular diet to encourage po intake per lengthy goals of care discussion at bedside today. This places at risk of aspiration but is best chance to augment nutritional intake.  - Must follow strict adjustments to minimize risk of aspiration including small bites, alt fluid/solid, chin tuck, frequent clearance, etc.  Acute toxic metabolic encephalopathy: Suspect due to narcotic administration. - Recently decreased dilaudid dose significantly.  - Delirium precautions - Seems more alert this AM  Abdominal pain: - Presently stable  Medication nonadherence: This predates this admission per his wife, though it has gotten worse and is actively impacting his care. We discussed the incongruence of wanting full life saving measures in a code situation but refusing medications at this time.  - Patient was earlier given encouragement to take medications, and will continue to seek ways to help with this.   HTN: BP not elevated. - Monitor on medications as above. Holding lisinopril, lasix, and norvasc -Stable at present  AKI:  - Minimal Urine output noted. Will check bladder scan - BUN trending up - continued on D5 at 100cc/hr - renal function stable. - Repeat bmet in AM  Hypernatremia: Improved with hypotonic saline - Stable at present  Moderate protein calorie malnutrition:  - Supplement with magic cup (prefers chocolate, won't eat anything else), ensure enlive per dietitian recommendations. Will continue to offer this. He will not accept prostat, will discontinue. - Discussed with dietitian today. Consideration  for alternative means of nutrition, however given worsening state, holding off currently  Hyperlipidemia:  - Continue to hold statin for now to decrease pill burden  Allergies:  - Hold antihistamine to decrease pill burden  End of Life - See previous documentation - Wishes noted to be DNR/DNI - Appreciate in put by Palliative Care - Concern that pt would not be candidate for aggressive cancer treatment, thus would likely benefit from hospice  DVT prophylaxis: eliquis Code Status: DNR Family Communication: Patient in room, family not at bedside Disposition Plan: Uncertain at this time  Consultants:   Oncology  Palliative Care  Radiation Oncology  Procedures:     Antimicrobials: Anti-infectives (From admission, onward)   Start     Dose/Rate Route Frequency Ordered Stop   01/20/18 2200  cefdinir (OMNICEF) capsule 300 mg  Status:  Discontinued     300 mg Oral 2 times daily 01/20/18 1604 01/20/18 1608   01/20/18 1700  cefdinir (OMNICEF) capsule 300 mg  Status:  Discontinued     300 mg Oral 2 times daily 01/20/18 1608 01/22/18 0704      Subjective: Cannot assess as pt is on bipap this AM  Objective: Vitals:   02/10/18 1356 02/10/18 1510 02/10/18 1540 02/10/18 1600  BP:    (!) 121/95  Pulse: (!) 117 (!) 101 (!) 128 98  Resp: (!) 23 14 20 14   Temp:      TempSrc:      SpO2: (!) 89% 97% 92% 96%  Weight:      Height:        Intake/Output Summary (Last 24 hours) at 02/10/2018 1644 Last data filed at 02/10/2018 1400 Gross per  24 hour  Intake 2152.14 ml  Output 300 ml  Net 1852.14 ml   Filed Weights   02/07/18 0449 02/09/18 0410 02/10/18 0425  Weight: 78.1 kg 79 kg 78.3 kg    Examination: General exam: Awake, laying in bed, in nad Respiratory system: Increased respiratory effort, no wheezing, on bipap Cardiovascular system: regular rate, s1, s2 Gastrointestinal system: Soft, nondistended, positive BS Central nervous system: CN2-12 grossly intact, strength  intact Extremities: Perfused, no clubbing Skin: Normal skin turgor, no notable skin lesions seen Psychiatry: difficult to assess as pt on bipap  Data Reviewed: I have personally reviewed following labs and imaging studies  CBC: Recent Labs  Lab 02/05/18 0540 02/07/18 0516 02/09/18 0428 02/10/18 0423  WBC 12.8* 12.5* 10.6* 11.9*  HGB 12.4* 12.2* 11.8* 10.5*  HCT 40.8 41.1 39.7 34.4*  MCV 98.1 99.0 101.0* 98.9  PLT 157 168 123* 94*   Basic Metabolic Panel: Recent Labs  Lab 02/05/18 0540 02/06/18 0523 02/07/18 0516 02/09/18 0428 02/10/18 0423  NA 137 137 135 133* 133*  K 4.2 4.3 4.3 5.6* 4.1  CL 102 101 102 100 101  CO2 25 26 22 26 25   GLUCOSE 98 100* 101* 132* 134*  BUN 41* 47* 45* 47* 42*  CREATININE 1.12 1.29* 1.22 1.55* 1.36*  CALCIUM 8.5* 8.7* 8.6* 9.0 8.9   GFR: Estimated Creatinine Clearance: 49.9 mL/min (A) (by C-G formula based on SCr of 1.36 mg/dL (H)). Liver Function Tests: No results for input(s): AST, ALT, ALKPHOS, BILITOT, PROT, ALBUMIN in the last 168 hours. No results for input(s): LIPASE, AMYLASE in the last 168 hours. No results for input(s): AMMONIA in the last 168 hours. Coagulation Profile: No results for input(s): INR, PROTIME in the last 168 hours. Cardiac Enzymes: No results for input(s): CKTOTAL, CKMB, CKMBINDEX, TROPONINI in the last 168 hours. BNP (last 3 results) No results for input(s): PROBNP in the last 8760 hours. HbA1C: No results for input(s): HGBA1C in the last 72 hours. CBG: No results for input(s): GLUCAP in the last 168 hours. Lipid Profile: No results for input(s): CHOL, HDL, LDLCALC, TRIG, CHOLHDL, LDLDIRECT in the last 72 hours. Thyroid Function Tests: No results for input(s): TSH, T4TOTAL, FREET4, T3FREE, THYROIDAB in the last 72 hours. Anemia Panel: No results for input(s): VITAMINB12, FOLATE, FERRITIN, TIBC, IRON, RETICCTPCT in the last 72 hours. Sepsis Labs: No results for input(s): PROCALCITON, LATICACIDVEN in the  last 168 hours.  Recent Results (from the past 240 hour(s))  MRSA PCR Screening     Status: None   Collection Time: 02/09/18  4:07 AM  Result Value Ref Range Status   MRSA by PCR NEGATIVE NEGATIVE Final    Comment:        The GeneXpert MRSA Assay (FDA approved for NASAL specimens only), is one component of a comprehensive MRSA colonization surveillance program. It is not intended to diagnose MRSA infection nor to guide or monitor treatment for MRSA infections. Performed at [facility], [place] [address] [place], [place] [address]   Culture, body fluid-bottle     Status: None (Preliminary result)   Collection Time: 02/09/18  2:49 PM  Result Value Ref Range Status   Specimen Description FLUID PLEURAL LEFT  Final   Special Requests BOTTLES DRAWN AEROBIC AND ANAEROBIC  Final   Gram Stain PENDING  Incomplete   Culture   Final    NO GROWTH < 24 HOURS Performed at [facility], [place] [address], [place], [place] [address]  Report Status PENDING  Incomplete  Gram stain     Status: None   Collection Time: 02/09/18  2:49 PM  Result Value Ref Range Status   Specimen Description FLUID PLEURAL LEFT  Final   Special Requests NONE  Final   Gram Stain   Final    NO WBC SEEN NO ORGANISMS SEEN CYTOSPIN SMEAR Performed at [facility], [address], [place], [place] [address]    Report Status 02/09/2018 FINAL  Final     Radiology Studies: Dg Chest 1 View  Result Date: 02/09/2018 CLINICAL DATA:  Status post LEFT thoracentesis. EXAM: CHEST  1 VIEW COMPARISON:  Chest radiograph February 09, 2018 at 0226 hours and CT chest December 31, 2017. FINDINGS: LEFT lung base out of field of view. Small bilateral pleural effusions, improved on the LEFT. Diffuse interstitial prominence. Subcentimeter nodular densities projecting LEFT lung similar to prior CT. No pneumothorax. Moderate cardiomegaly, status post median sternotomy for cardiac valve replacement and likely  CABG. Advanced LEFT glenohumeral osteoarthrosis. IMPRESSION: 1. Small residual pleural effusions, no pneumothorax status post thoracentesis. 2. Cardiomegaly and interstitial prominence concerning for chronic interstitial changes and superimposed pulmonary edema. 3. Similar nodular densities LEFT lung. Electronically Signed   By: Elon Alas M.D.   On: 02/09/2018 15:48   Dg Chest Port 1 View  Result Date: 02/09/2018 CLINICAL DATA:  Pleural effusion EXAM: PORTABLE CHEST 1 VIEW COMPARISON:  02/04/2018, 01/31/2018, 01/26/2018 FINDINGS: Post sternotomy changes. Interval white out of the left hemithorax. Development of right pleural effusion with right basilar airspace disease. Cardiomediastinal silhouette is largely obscured. IMPRESSION: Interval complete opacification of the left thorax which may be secondary to large effusion and or combination of effusion and underlying parenchymal lung disease. Development of at least small right pleural effusion with right basilar atelectasis or infiltrate. Electronically Signed   By: Donavan Foil M.D.   On: 02/09/2018 03:01   US Thoracentesis Asp Pleural Space W/img Guide  Result Date: 02/09/2018 INDICATION: Emphysematous COPD, systolic CHF, and acute hypoxic respiratory failure. Left pleural effusion. Request therapeutic thoracentesis EXAM: ULTRASOUND GUIDED LEFT THORACENTESIS MEDICATIONS: 1% lidocaine 10 mL COMPLICATIONS: None immediate. PROCEDURE: An ultrasound guided thoracentesis was thoroughly discussed with the patient and questions answered. The benefits, risks, alternatives and complications were also discussed. The patient understands and wishes to proceed with the procedure. Written consent was obtained. Ultrasound was performed to localize and mark an adequate pocket of fluid in the left chest. The area was then prepped and draped in the normal sterile fashion. 1% Lidocaine was used for local anesthesia. Under ultrasound guidance a 6 Fr Safe-T-Centesis  catheter was introduced. Thoracentesis was performed. The catheter was removed and a dressing applied. FINDINGS: A total of approximately 2.1 L of hazy yellow fluid was removed. IMPRESSION: Successful ultrasound guided left thoracentesis yielding 2.1 L of pleural fluid. No pneumothorax on post-procedure chest x-ray. Read by: Gareth Eagle, PA-C Electronically Signed   By: Corrie Mckusick D.O.   On: 02/09/2018 15:44    Scheduled Meds: . amiodarone  200 mg Oral BID  . apixaban  5 mg Oral BID  . chlorhexidine  15 mL Mouth Rinse BID  . diltiazem  240 mg Oral Daily  . feeding supplement (ENSURE ENLIVE)  237 mL Oral BID BM  . ipratropium  0.5 mg Nebulization Q6H  . levalbuterol  0.63 mg Nebulization Q6H  . mouth rinse  15 mL Mouth Rinse BID  . methylPREDNISolone (SOLU-MEDROL) injection  40 mg Intravenous Daily  . metoprolol  tartrate  100 mg Oral BID  . sodium chloride flush  10-40 mL Intracatheter Q12H   Continuous Infusions: . dextrose 100 mL/hr at 02/10/18 1243     LOS: 21 days   Marylu Lund, MD [facility] Pager On Amion  If 7PM-7AM, please contact night-coverage 02/10/2018, 4:44 PM

## 2018-02-10 NOTE — Progress Notes (Signed)
Daily Progress Note   Patient Name: Cody Lane       Date: 02/10/2018 DOB: 06/25/1940  Age: 78 y.o. MRN#: [id number] Attending Physician: Donne Hazel, MD Primary Care Physician: [name] Admit Date: 01/20/2018  Reason for Consultation/Follow-up: Establishing goals of care  Subjective:  Patient is lying in bed.  Off bipap and acknowledges examiner as enters room. Minimal attempt to engage. Denies pain.  Reports breathing is "OK."   Discussed with sister at bedside.  Chart reviewed   Length of Stay: 21  Current Medications: Scheduled Meds:  . amiodarone  200 mg Oral BID  . apixaban  5 mg Oral BID  . chlorhexidine  15 mL Mouth Rinse BID  . diltiazem  240 mg Oral Daily  . feeding supplement (ENSURE ENLIVE)  237 mL Oral BID BM  . ipratropium  0.5 mg Nebulization Q6H  . levalbuterol  0.63 mg Nebulization Q6H  . mouth rinse  15 mL Mouth Rinse BID  . methylPREDNISolone (SOLU-MEDROL) injection  40 mg Intravenous Daily  . metoprolol tartrate  100 mg Oral BID  . sodium chloride flush  10-40 mL Intracatheter Q12H    Continuous Infusions: . dextrose 100 mL/hr at 02/10/18 1243    PRN Meds: acetaminophen **OR** acetaminophen, HYDROmorphone (DILAUDID) injection, metoprolol tartrate, ondansetron (ZOFRAN) IV, RESOURCE THICKENUP CLEAR, senna-docusate, sodium chloride flush  Physical Exam         Weak appearing gentleman Diminished breath sound No abdominal pain No edema More awake but does not engage in conversation Generalized weakness evident  Vital Signs: BP (!) 138/102   Pulse (!) 117   Temp 98.4 F (36.9 C) (Axillary)   Resp (!) 23   Ht 6' (1.829 m)   Wt 78.3 kg   SpO2 (!) 89%   BMI 23.41 kg/m  SpO2: SpO2: (!) 89 % O2 Device: O2 Device: Room  Air(patient refusing HFNC and bipap at this time ) O2 Flow Rate: O2 Flow Rate (L/min): 15 L/min  Intake/output summary:   Intake/Output Summary (Last 24 hours) at 02/10/2018 1428 Last data filed at 02/10/2018 1400 Gross per 24 hour  Intake 2152.14 ml  Output 300 ml  Net 1852.14 ml   LBM: Last BM Date: 02/09/18 Baseline Weight: Weight: 76.2 kg Most recent weight: Weight: 78.3 kg  PPS 30% Palliative Assessment/Data:    Flowsheet Rows     Most Recent Value  Intake Tab  Referral Department  Hospitalist  Unit at Time of Referral  Intermediate Care Unit  Palliative Care Primary Diagnosis  Pulmonary  Date Notified  01/20/18  Palliative Care Type  New Palliative care  Reason for referral  Clarify Goals of Care, Psychosocial or Spiritual support  Date of Admission  01/20/18  Date first seen by Palliative Care  01/21/18  # of days Palliative referral response time  1 Day(s)  # of days IP prior to Palliative referral  0  Clinical Assessment  Pain Max last 24 hours  Not able to report  Pain Min Last 24 hours  Not able to report  Dyspnea Max Last 24 Hours  Not able to report  Dyspnea Min Last 24 hours  Not able to report  Nausea Max Last 24 Hours  Not able to report  Nausea Min Last 24 Hours  Not able to report  Anxiety Max Last 24 Hours  Not able to report  Anxiety Min Last 24 Hours  Not able to report  Other Max Last 24 Hours  Not able to report  Psychosocial & Spiritual Assessment  Palliative Care Outcomes  Patient/Family meeting held?  Yes  Who was at the meeting?  pt and wife  Palliative Care follow-up planned  Yes, Facility      Patient Active Problem List   Diagnosis Date Noted  . S/P thoracentesis   . Pleural effusion   . Paroxysmal atrial fibrillation (HCC)   . Acute on chronic systolic heart failure ([place]) 01/21/2018  . Physical deconditioning 01/21/2018  . CAP (community acquired pneumonia) 01/21/2018  . Malnutrition of moderate degree 01/21/2018  . Goals  of care, counseling/discussion   . Palliative care by specialist   . Chest pain 01/20/2018  . Primary malignant neoplasm of bronchus of left lower lobe ([place]) 01/20/2018  . COPD (chronic obstructive pulmonary disease) with emphysema ([place]) 01/20/2018  . Chronic respiratory failure with hypoxia, on home O2 therapy ([place]) 01/20/2018  . Vertigo 10/15/2017  . Diplopia 10/15/2017  . Cough   . Aortic valve insufficiency S/P aortic valve replacement 03/26/2014  . Kidney mass 03/26/2014  . Essential hypertension 03/26/2014  . Permanent atrial fibrillation 03/26/2014  . Long term current use of anticoagulant therapy 03/26/2014  . Hyperlipidemia 03/26/2014  . CHF (congestive heart failure) ([place]) 03/26/2014  . Insomnia 03/26/2014    Palliative Care Assessment & Plan   Patient Profile:  78 year old male with a history of emphysematous COPD, systolic CHF, atrial fibrillation, recent CVA who is admitted for acute hypoxic respiratory failure. 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 He was in the process of initiating chemotherapy through the [place] system but this was deferred because he became hypoxemic and was hospitalized.   He was recently discharged and readmitted to [facility] acute hypoxic respiratory failure secondary to acute on chronic systolic CHF, knownlung cancer, chronic COPD, and community-acquired pneumonia. He completed a full course of antibiotics for CAP and has been diuresed for pulmonary edema. He underwent a thoracentesis with 900 cc of fluid removed. 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 Despite removal of fluid he continued to have increased supplemental oxygen requirements. Pulmonology and radiation oncology both consulted and recommendeded transfer to [facility] hospital for  palliative radiation.   His hospital course has been complicated by by hypotension, persistent atrial fibrillation with RVR, intermittent confusion, difficulty swallowing, and subacute stroke.  Assessment:  ongoing functional decline Ongoing dysphagia Ongoing cancer related cachexia Patient refusing radiation/other treatments at times Abdominal pain.   Recommendations/Plan:  Continue IV dilaudid to be used PRN.  DNR/DNI  Off Bipap currently and more awake today.  Discussed with sister at bedside regarding concerns that he is not going to improve long term.  She acknowledges this and reports that his wife is still hopeful he will improve.    PMT to follow to continue to progress goals conversation based on clinical course.     Goals of Care and Additional Recommendations:  Limitations on Scope of Treatment: Full Scope Treatment   Prognosis:   guarded  Discharge Planning:  To Be Determined  Care plan was discussed with  Patient's sister  Thank you for allowing the Palliative Medicine Team to assist in the care of this patient.   Total Time 20 Prolonged Time Billed  no       Greater than 50%  of this time was spent counseling and coordinating care related to the above assessment and plan.  Micheline Rough, MD  Please contact Palliative Medicine Team phone at [phone] for questions and concerns.

## 2018-02-10 NOTE — Progress Notes (Signed)
Pt only got part of nebulizer breathing treatment  Scanned Pt arm band and talked to Pt about the medications I had for him and what they were for.  Pt ripped nebulizer mask off shortly after placing on face.  I discussed again with Pt the importance of these nebulizers.  Put the mouthpiece on treatment and tried to get the Pt to finish the treatment that way but Pt refused any attempts.  Will try again with next scheduled breathing treatment.

## 2018-02-10 NOTE — Care Management Note (Signed)
Case Management Note  Patient Details  Name: Cody Lane MRN: [id number] Date of Birth: 11/16/1940  Subjective/Objective:                  DISCHARGE PLANNING  Action/Plan: Information Active at the [facility] at pager [phone] number [phone]  Expected Discharge Date:                  Expected Discharge Plan:  [place]  In-House Referral:  Clinical Social Work  Discharge planning Services  CM Consult  Post Acute Care Choice:  Home Health(Active w/[facility] HHRN/PT) Choice offered to:     DME Arranged:    DME Agency:     HH Arranged:    [place] Agency:     Status of Service:  In process, will continue to follow  If discussed at Long Length of Stay Meetings, dates discussed:    Additional Comments:  Leeroy Cha, RN 02/10/2018, 2:24 PM

## 2018-02-10 NOTE — Progress Notes (Signed)
Pt remains off CPAP on High flow o2.  Unable to obtain consistent O2 SAT , numerous probes used .  RR 14 - 20 HR 90's .  No resp distress observed

## 2018-02-11 ENCOUNTER — Other Ambulatory Visit: Payer: Self-pay | Admitting: *Deleted

## 2018-02-11 ENCOUNTER — Ambulatory Visit: Payer: Medicare Other

## 2018-02-11 ENCOUNTER — Encounter: Payer: Self-pay | Admitting: Radiation Oncology

## 2018-02-11 DIAGNOSIS — C3432 Malignant neoplasm of lower lobe, left bronchus or lung: Secondary | ICD-10-CM

## 2018-02-11 LAB — CBC
HCT: 34.5 % — ABNORMAL LOW (ref 39.0–52.0)
Hemoglobin: 10.6 g/dL — ABNORMAL LOW (ref 13.0–17.0)
MCH: 29.4 pg (ref 26.0–34.0)
MCHC: 30.7 g/dL (ref 30.0–36.0)
MCV: 95.6 fL (ref 80.0–100.0)
Platelets: 77 10*3/uL — ABNORMAL LOW (ref 150–400)
RBC: 3.61 MIL/uL — ABNORMAL LOW (ref 4.22–5.81)
RDW: 14.2 % (ref 11.5–15.5)
WBC: 11.7 10*3/uL — ABNORMAL HIGH (ref 4.0–10.5)
nRBC: 0 % (ref 0.0–0.2)

## 2018-02-11 LAB — BASIC METABOLIC PANEL
Anion gap: 6 (ref 5–15)
BUN: 36 mg/dL — ABNORMAL HIGH (ref 8–23)
CO2: 27 mmol/L (ref 22–32)
CREATININE: 1.3 mg/dL — AB (ref 0.61–1.24)
Calcium: 8.8 mg/dL — ABNORMAL LOW (ref 8.9–10.3)
Chloride: 98 mmol/L (ref 98–111)
GFR calc Af Amer: >60 mL/min (ref >60)
GFR calc non Af Amer: 53 mL/min — ABNORMAL LOW (ref >60)
Glucose, Bld: 126 mg/dL — ABNORMAL HIGH (ref 70–99)
Potassium: 4.3 mmol/L (ref 3.5–5.1)
Sodium: 131 mmol/L — ABNORMAL LOW (ref 135–145)

## 2018-02-11 MED ORDER — LEVALBUTEROL HCL 0.63 MG/3ML IN NEBU: 0.6300 mg | INHALATION_SOLUTION | Freq: Four times a day (QID) | RESPIRATORY_TRACT | Status: DC | PRN

## 2018-02-11 MED ORDER — HYDROMORPHONE HCL 1 MG/ML IJ SOLN
0.2500 mg | INTRAMUSCULAR | Status: DC | PRN
  Administered 2018-02-11 – 2018-02-12 (×3): 0.25 mg via INTRAVENOUS
  Filled 2018-02-11 (×3): qty 1

## 2018-02-11 NOTE — Progress Notes (Signed)
Pt currently on 15 LPM [place] and tolerating well at this time.  Pt in no apparent distress and appears to be resting comfortably, BIPAP not indicated at this time.  RT to monitor and assess as needed.

## 2018-02-11 NOTE — Progress Notes (Signed)
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 Fib, history of aortic valve replacement.  As noted he was recently discharged 3 days ago after hospitalization for hypoxia and pneumonia as well as acute metabolic encephalopathy due to those conditions.  At that hospitalization he ended up being discharged on oral antibiotics and with home oxygen therapy.  At home he removed his oxygen to go to the bathroom in the bathroom he developed left-sided chest pain.    OT comments  Pt tolerated sitting EOB with min/mod A and sats remaining above 90.  Washed back and pt washed face.     Follow Up Recommendations  SNF    Equipment Recommendations  (defer to next venue)    Recommendations for Other Services      Precautions / Restrictions Precautions Precautions: Fall Precaution Comments: watch HR, 02 dependent Restrictions Weight Bearing Restrictions: No       Mobility Bed Mobility         Supine to sit: Max assist;+2 for physical assistance Sit to supine: Total assist;+2 for physical assistance   General bed mobility comments: used pad to assist pt to EOB. Pt did help lift trunk  Transfers                 General transfer comment: not attempted    Balance   Sitting-balance support: Feet supported;Bilateral upper extremity supported Sitting balance-Leahy Scale: Poor Sitting balance - Comments: min to mod A to sit eob x 8 minutes                                   ADL either performed or assessed with clinical judgement   ADL       Grooming: Wash/dry face;Minimal assistance;Sitting Grooming Details (indicate cue type and reason): hand over  hand assist to initiate                                     Vision       Perception     Praxis      Cognition Arousal/Alertness: Awake/alert Behavior During Therapy: Flat affect Overall Cognitive Status: Impaired/Different from baseline                   Orientation Level: Disoriented to;Place;Time;Situation Current Attention Level: Focused Memory: Decreased short-term memory Following Commands: Follows one step commands with increased time     Problem Solving: Slow processing;Decreased initiation;Difficulty sequencing;Requires verbal cues;Requires tactile cues          Exercises     Shoulder Instructions       General Comments wet quality to breathing. Sats remained above 90 for session    Pertinent Vitals/ Pain       Pain Assessment: No/denies pain  Home Living                                          Prior Functioning/Environment  Frequency  Min 2X/week        Progress Toward Goals  OT Goals(current goals can now be found in the care plan section)  Progress towards OT goals: Not progressing toward goals - comment;Goals drowngraded-see care plan  Acute Rehab OT Goals Time For Goal Achievement: 02/25/18 ADL Goals Pt Will Perform Grooming: with min assist;sitting(x 2 tasks with rest breaks) Pt Will Perform Upper Body Bathing: (discontinue) Pt Will Perform Lower Body Bathing: (discontinue) Pt Will Perform Upper Body Dressing: (discontinue) Pt Will Perform Lower Body Dressing: (discontinue) Pt Will Transfer to Toilet: (discontinue) Pt Will Perform Toileting - Clothing Manipulation and hygiene: (discontinue) Additional ADL Goal #1: pt will tolerate sitting eob x 10 minutes with min A as tolerated/desired  Plan      Co-evaluation    PT/OT/SLP Co-Evaluation/Treatment: Yes Reason for Co-Treatment: Complexity of the patient's impairments (multi-system involvement) PT goals addressed during  session: Mobility/safety with mobility OT goals addressed during session: ADL's and self-care      AM-PAC OT "6 Clicks" Daily Activity     Outcome Measure   Help from another person eating meals?: Total Help from another person taking care of personal grooming?: A Lot Help from another person toileting, which includes using toliet, bedpan, or urinal?: Total Help from another person bathing (including washing, rinsing, drying)?: Total Help from another person to put on and taking off regular upper body clothing?: Total Help from another person to put on and taking off regular lower body clothing?: Total 6 Click Score: 7    End of Session    OT Visit Diagnosis: Muscle weakness (generalized) (M62.81)   Activity Tolerance Patient limited by fatigue   Patient Left in bed;with call bell/phone within reach;with bed alarm set   Nurse Communication          Time: 8185-9093 OT Time Calculation (min): 17 min  Charges: OT General Charges $OT Visit: 1 Visit(no charge; cotx)  Lesle Chris, OTR/L Acute Rehabilitation Services [phone] [facility] pager [phone] office 02/11/2018   [place] 02/11/2018, 12:46 PM

## 2018-02-11 NOTE — Progress Notes (Signed)
I was delayed in my ability to come see the patient yesterday but I was able to come see him this am. Edwena Bunde held his palliative radiotherapy since Wednesday due to oxygen demands requiring BIPAP. While that has been d/c'd, his O2 demands are 15 L High Flow, and previously prior to radiotherapy was 5 L. His wife is interested in discontinuing radiotherapy, and I think that's very reasonable. He does not appear to be a candidate for any systemic chemotherapy given his current status. We discussed that he has an incurable cancer and at this time the patient's wife and sister are in agreement to proceed with comfort care only and to only administer treatments that uphold his quality of life. I will be in touch with palliative care as well regarding this decision.      Carola Rhine, PAC

## 2018-02-11 NOTE — Progress Notes (Signed)
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 Fib, history of aortic valve replacement.  As noted he was recently discharged 3 days ago after hospitalization for hypoxia and pneumonia as well as acute metabolic encephalopathy due to those conditions.  At that hospitalization he ended up being discharged on oral antibiotics and with home oxygen therapy.  At home he removed his oxygen to go to the bathroom in the bathroom he developed left-sided chest pain.     PT Comments    Pt slow to progress - fatigues easily.  Follow Up Recommendations  SNF     Equipment Recommendations       Recommendations for Other Services       Precautions / Restrictions Precautions Precautions: Fall Precaution Comments: watch HR, 02 dependent Restrictions Weight Bearing Restrictions: No    Mobility  Bed Mobility Overal bed mobility: Needs Assistance Bed Mobility: Rolling;Supine to Sit;Sit to Supine Rolling: Max assist   Supine to sit: Max assist;+2 for physical assistance Sit to supine: Total assist;+2 for physical assistance   General bed mobility comments: used pad to assist pt to EOB. Pt did help lift trunk  Transfers                 General transfer comment: not attempted  Ambulation/Gait                 Stairs             Wheelchair Mobility    Modified Rankin (Stroke Patients Only)       Balance Overall balance assessment: Needs assistance;History of Falls Sitting-balance support: Feet supported;Bilateral upper extremity supported Sitting balance-Leahy Scale: Poor Sitting balance - Comments: min to mod A to sit eob x 8 minutes                                     Cognition Arousal/Alertness: Awake/alert Behavior During Therapy: Flat affect Overall Cognitive Status: Impaired/Different from baseline                   Orientation Level: Disoriented to;Place;Time;Situation Current Attention Level: Focused Memory: Decreased short-term memory Following Commands: Follows one step commands with increased time     Problem Solving: Slow processing;Decreased initiation;Difficulty sequencing;Requires verbal cues;Requires tactile cues        Exercises      General Comments General comments (skin integrity, edema, etc.): Sats remained above 90% throughout session; wet quality to breathing - RN aware      Pertinent Vitals/Pain Pain Assessment: No/denies pain Pain Intervention(s): Monitored during session    Home Living                      Prior Function            PT Goals (current goals can now be found in the care plan section) Acute Rehab PT Goals Patient Stated Goal: No goals expressed PT Goal Formulation: Patient unable to participate in goal setting Time For Goal Achievement: 02/22/18 Potential to Achieve Goals: Fair Progress towards PT goals: Progressing toward goals    Frequency    Min 2X/week  PT Plan Current plan remains appropriate    Co-evaluation PT/OT/SLP Co-Evaluation/Treatment: Yes Reason for Co-Treatment: Complexity of the patient's impairments (multi-system involvement);For patient/therapist safety PT goals addressed during session: Mobility/safety with mobility OT goals addressed during session: ADL's and self-care      AM-PAC PT "6 Clicks" Mobility   Outcome Measure  Help needed turning from your back to your side while in a flat bed without using bedrails?: A Lot Help needed moving from lying on your back to sitting on the side of a flat bed without using bedrails?: A Lot Help needed moving to and from a bed to a chair (including a wheelchair)?:  Total Help needed standing up from a chair using your arms (e.g., wheelchair or bedside chair)?: Total Help needed to walk in hospital room?: Total Help needed climbing 3-5 steps with a railing? : Total 6 Click Score: 8    End of Session Equipment Utilized During Treatment: Oxygen Activity Tolerance: Patient limited by fatigue;Patient limited by lethargy Patient left: in bed;with call bell/phone within reach;with bed alarm set;with family/visitor present Nurse Communication: Mobility status;Other (comment) PT Visit Diagnosis: Muscle weakness (generalized) (M62.81);Other abnormalities of gait and mobility (R26.89)     Time: 0762-2633 PT Time Calculation (min) (ACUTE ONLY): 19 min  Charges:  $Therapeutic Activity: 8-22 mins                     [place] Pager [phone] Office [phone]    Yashira Offenberger 02/11/2018, 2:12 PM

## 2018-02-11 NOTE — Progress Notes (Signed)
PROGRESS NOTE    LEMONTE AL  GQQ:[id number] DOB: 06-05-1940 DOA: 01/20/2018 PCP: [facility], [name]    Brief Narrative:  78 year old male with a history of emphysematous COPD, systolic CHF, atrial fibrillation, recent CVA who is admitted for acute hypoxic respiratory failure. 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 He was in the process of initiating chemotherapy through the [place] system but this was deferred because he became hypoxemic and was hospitalized on these occasions. He was recently discharged and readmitted to [facility] acute hypoxic respiratory failure secondary to acute on chronic systolic CHF, knownlung cancer, chronic COPD, and community-acquired pneumonia. He completed a full course of antibiotics for CAP and has been diuresed for pulmonary edema. He underwent a thoracentesis with 900 cc of fluid removed. 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 Despite removal of fluid he continues to have increased supplemental oxygen requirements. Pulmonology and radiation oncology both consulted and recommended transfer to [facility] for palliative radiation.   His hospital course has been complicated by by hypotension, persistent atrial fibrillation with RVR, intermittent confusion, difficulty swallowing, and subacute stroke.  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 (HCC) Active Problems:   Essential hypertension   COPD (chronic obstructive pulmonary disease) with emphysema (HCC)   Chronic respiratory failure with hypoxia, on home O2 therapy (HCC)   Acute on chronic systolic heart failure (HCC)   Physical deconditioning   CAP (community acquired pneumonia)   Malnutrition of moderate degree   Goals of care,  counseling/discussion   Palliative care by specialist   Paroxysmal atrial fibrillation (HCC)   Pleural effusion   S/P thoracentesis  Acute hypoxic respiratory failure secondary to COPD exacerbation, acute on chronic systolic CHF, non-small cell lung cancer, malignant pleural effusion: Was also hospitalized at [facility] 12/31 - 1/6 for pneumonia where AFib was difficult to control as well. - Treat contributing conditions as below. - Recently noted to have decompensated with increased O2 requirements, needing bipap - Recent CXR personally reviewed. Findings of L sided white out, concerns for pleural effusion. Have requested US guided thoracentesis primarily for comfort - Appreciate assistance by Palliative Care. See note earlier. Pt now DNR/DNI - Weaned off Bipap, currently on high flow O2.   Emphysematous/bullous COPD:  - Continue BD's (xopenex). - On bronchodilators as tolerated and as needed  Squamous cell NSCLC: Per family, was slated to start chemotherapy 12/26 and was held due to hypoxia.   - XRT was started 01/31/2018 with plans for 2 weeks per Dr. Lisbeth Renshaw with aim of improving respiratory status. Continuing daily M-F. - Has referral to Dr. Julien Nordmann, establish care appointment 2/3.  - With worsening renal function, likely unable to tolerate chemo. Discussed with on-call Oncologist as well as pt's primary Oncologist - Appreciate input by Radiation Oncology. Pt is not candidate for further chemo and no further plans for radiation treatments noted. Now plan to focus on comfort -Discussed with Palliative Care. Question home with hospice vs residential hospice  PAF with RVR: Rate still intermittently elevated. - Had been continued on eliquis and continued metoprolol 100mg  po BID.  - Presently on amiodarone. TSH recently 1.65. K and Mg at goal. - Anticipate transition to comfort later  Acute on chronic HFrEF:  - Holding lasix 40mg  po daily home dose  with AKI and ongoing little po  intake, in fact will continue supporting with sub-maintenance IV fluids, no peripheral overload or crackles noted. - Cr recently worsened, on IVF. 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  - Continued on eliquis.  - Not giving statin to reduce po burden - PT earlier recommended SNF - No CES on echocardiogram - Remains stable at present  Dysphagia: Reviewed MBS video images with patient and wife at bedside. SLP evaluations ongoing.  - Regular diet to encourage po intake per lengthy goals of care discussion at bedside today. This places at risk of aspiration but is best chance to augment nutritional intake.  - Must follow strict adjustments to minimize risk of aspiration including small bites, alt fluid/solid, chin tuck, frequent clearance, etc. - Stable at present  Acute toxic metabolic encephalopathy: Suspect due to narcotic administration. - Recently decreased dilaudid dose significantly.  - Delirium precautions - More alert now while off bipap  Abdominal pain: - Presently stable  Medication nonadherence: This predates this admission per his wife, though it has gotten worse and is actively impacting his care. We discussed the incongruence of wanting full life saving measures in a code situation but refusing medications at this time.  - Patient was earlier given encouragement to take medications, and will continue to seek ways to help with this.   HTN: BP not elevated. - Monitor on medications as above. Holding lisinopril, lasix, and norvasc -Stable at present  AKI:  - Minimal Urine output noted. Will check bladder scan - BUN trending up - continued on D5 at 100cc/hr - renal function had been stable.  Hypernatremia: Improved with hypotonic saline - Stable at present  Moderate protein calorie malnutrition:  - Supplement with magic cup (prefers  chocolate, won't eat anything else), ensure enlive per dietitian recommendations. Will continue to offer this. He will not accept prostat, will discontinue. - Discussed with dietitian today. Consideration for alternative means of nutrition, however given worsening state, holding off currently  Hyperlipidemia:  - Continue to hold statin for now to decrease pill burden  Allergies:  - Hold antihistamine to decrease pill burden  End of Life - See previous documentation - Wishes noted to be DNR/DNI - Appreciate in put by Palliative Care and Radiation Oncology - Possible transition to full comfort later. Discussed with Palliative Care  DVT prophylaxis: eliquis Code Status: DNR Family Communication: Patient in room, family not at bedside Disposition Plan: Uncertain at this time  Consultants:   Oncology  Palliative Care  Radiation Oncology  Procedures:     Antimicrobials: Anti-infectives (From admission, onward)   Start     Dose/Rate Route Frequency Ordered Stop   01/20/18 2200  cefdinir (OMNICEF) capsule 300 mg  Status:  Discontinued     300 mg Oral 2 times daily 01/20/18 1604 01/20/18 1608   01/20/18 1700  cefdinir (OMNICEF) capsule 300 mg  Status:  Discontinued     300 mg Oral 2 times daily 01/20/18 1608 01/22/18 0704      Subjective: Unable to assess. Pt not very verbal  Objective: Vitals:   02/11/18 1100 02/11/18 1200 02/11/18 1300 02/11/18 1400  BP: (!) 152/109 (!) 150/116 (!) 150/103 (!) 129/106  Pulse: 86 74 80 81  Resp: (!) 28 (!) 28 (!) 29 (!) 29  Temp:  98.4 F (36.9 C)    TempSrc:  Oral    SpO2: 94% 92% 94% 94%  Weight:      Height:        Intake/Output Summary (Last 24 hours) at 02/11/2018 1450 Last data filed at 02/11/2018 1400 Gross per 24 hour  Intake 2376.88 ml  Output -  Net 2376.88 ml   Filed Weights   02/09/18 0410 02/10/18 0425 02/11/18 0405  Weight: 79 kg 78.3 kg 79.1 kg    Examination: General exam: awake, not conversant, in no  acute distress Respiratory system: normal chest rise, clear, no audible wheezing Cardiovascular system: regular rhythm, s1-s2 Gastrointestinal system: Nondistended, nontender, pos BS Central nervous system: No seizures, no tremors Extremities: No cyanosis, no joint deformities Skin: No rashes, no pallor Psychiatry: Unable to assess as pt not verbal  Data Reviewed: I have personally reviewed following labs and imaging studies  CBC: Recent Labs  Lab 02/05/18 0540 02/07/18 0516 02/09/18 0428 02/10/18 0423 02/11/18 0359  WBC 12.8* 12.5* 10.6* 11.9* 11.7*  HGB 12.4* 12.2* 11.8* 10.5* 10.6*  HCT 40.8 41.1 39.7 34.4* 34.5*  MCV 98.1 99.0 101.0* 98.9 95.6  PLT 157 168 123* 94* 77*   Basic Metabolic Panel: Recent Labs  Lab 02/06/18 0523 02/07/18 0516 02/09/18 0428 02/10/18 0423 02/11/18 0359  NA 137 135 133* 133* 131*  K 4.3 4.3 5.6* 4.1 4.3  CL 101 102 100 101 98  CO2 26 22 26 25 27   GLUCOSE 100* 101* 132* 134* 126*  BUN 47* 45* 47* 42* 36*  CREATININE 1.29* 1.22 1.55* 1.36* 1.30*  CALCIUM 8.7* 8.6* 9.0 8.9 8.8*   GFR: Estimated Creatinine Clearance: 52.2 mL/min (A) (by C-G formula based on SCr of 1.3 mg/dL (H)). Liver Function Tests: No results for input(s): AST, ALT, ALKPHOS, BILITOT, PROT, ALBUMIN in the last 168 hours. No results for input(s): LIPASE, AMYLASE in the last 168 hours. No results for input(s): AMMONIA in the last 168 hours. Coagulation Profile: No results for input(s): INR, PROTIME in the last 168 hours. Cardiac Enzymes: No results for input(s): CKTOTAL, CKMB, CKMBINDEX, TROPONINI in the last 168 hours. BNP (last 3 results) No results for input(s): PROBNP in the last 8760 hours. HbA1C: No results for input(s): HGBA1C in the last 72 hours. CBG: No results for input(s): GLUCAP in the last 168 hours. Lipid Profile: No results for input(s): CHOL, HDL, LDLCALC, TRIG, CHOLHDL, LDLDIRECT in the last 72 hours. Thyroid Function Tests: No results for  input(s): TSH, T4TOTAL, FREET4, T3FREE, THYROIDAB in the last 72 hours. Anemia Panel: No results for input(s): VITAMINB12, FOLATE, FERRITIN, TIBC, IRON, RETICCTPCT in the last 72 hours. Sepsis Labs: No results for input(s): PROCALCITON, LATICACIDVEN in the last 168 hours.  Recent Results (from the past 240 hour(s))  MRSA PCR Screening     Status: None   Collection Time: 02/09/18  4:07 AM  Result Value Ref Range Status   MRSA by PCR NEGATIVE NEGATIVE Final    Comment:        The GeneXpert MRSA Assay (FDA approved for NASAL specimens only), is one component of a comprehensive MRSA colonization surveillance program. It is not intended to diagnose MRSA infection nor to guide or monitor treatment for MRSA infections. Performed at [facility], [place] [address], [place], [place] [address]   Culture, body fluid-bottle     Status: None (Preliminary result)   Collection Time: 02/09/18  2:49 PM  Result Value Ref Range Status   Specimen Description FLUID PLEURAL LEFT  Final   Special Requests BOTTLES DRAWN AEROBIC AND ANAEROBIC  Final   Gram Stain  PENDING  Incomplete   Culture   Final    NO GROWTH 2 DAYS Performed at [facility], [place] [address], [place], [place] [address]    Report Status PENDING  Incomplete  Gram stain     Status: None   Collection Time: 02/09/18  2:49 PM  Result Value Ref Range Status   Specimen Description FLUID PLEURAL LEFT  Final   Special Requests NONE  Final   Gram Stain   Final    NO WBC SEEN NO ORGANISMS SEEN CYTOSPIN SMEAR Performed at [facility], [address] [place], [place] [address]    Report Status 02/09/2018 FINAL  Final     Radiology Studies: Dg Chest 1 View  Result Date: 02/09/2018 CLINICAL DATA:  Status post LEFT thoracentesis. EXAM: CHEST  1 VIEW COMPARISON:  Chest radiograph February 09, 2018 at 0226 hours and CT chest December 31, 2017. FINDINGS: LEFT lung base out of field of view. Small bilateral  pleural effusions, improved on the LEFT. Diffuse interstitial prominence. Subcentimeter nodular densities projecting LEFT lung similar to prior CT. No pneumothorax. 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 Advanced LEFT glenohumeral osteoarthrosis. IMPRESSION: 1. Small residual pleural effusions, no pneumothorax status post thoracentesis. 2. Cardiomegaly and interstitial prominence concerning for chronic interstitial changes and superimposed pulmonary edema. 3. Similar nodular densities LEFT lung. Electronically Signed   By: Elon Alas M.D.   On: 02/09/2018 15:48   US Thoracentesis Asp Pleural Space W/img Guide  Result Date: 02/09/2018 INDICATION: Emphysematous COPD, systolic CHF, and acute hypoxic respiratory failure. Left pleural effusion. Request therapeutic thoracentesis EXAM: ULTRASOUND GUIDED LEFT THORACENTESIS MEDICATIONS: 1% lidocaine 10 mL COMPLICATIONS: None immediate. PROCEDURE: An ultrasound guided thoracentesis was thoroughly discussed with the patient and questions answered. The benefits, risks, alternatives and complications were also discussed. The patient understands and wishes to proceed with the procedure. Written consent was obtained. Ultrasound was performed to localize and mark an adequate pocket of fluid in the left chest. The area was then prepped and draped in the normal sterile fashion. 1% Lidocaine was used for local anesthesia. Under ultrasound guidance a 6 Fr Safe-T-Centesis catheter was introduced. Thoracentesis was performed. The catheter was removed and a dressing applied. FINDINGS: A total of approximately 2.1 L of hazy yellow fluid was removed. IMPRESSION: Successful ultrasound guided left thoracentesis yielding 2.1 L of pleural fluid. No pneumothorax on post-procedure chest x-ray. Read by: Gareth Eagle, PA-C Electronically Signed   By: Corrie Mckusick D.O.   On: 02/09/2018 15:44    Scheduled Meds: . amiodarone   200 mg Oral BID  . apixaban  5 mg Oral BID  . chlorhexidine  15 mL Mouth Rinse BID  . diltiazem  240 mg Oral Daily  . feeding supplement (ENSURE ENLIVE)  237 mL Oral BID BM  . mouth rinse  15 mL Mouth Rinse BID  . methylPREDNISolone (SOLU-MEDROL) injection  40 mg Intravenous Daily  . metoprolol tartrate  100 mg Oral BID  . sodium chloride flush  10-40 mL Intracatheter Q12H   Continuous Infusions: . dextrose 100 mL/hr at 02/11/18 1400     LOS: 22 days   Marylu Lund, MD [facility] Pager On Amion  If 7PM-7AM, please contact night-coverage 02/11/2018, 2:50 PM

## 2018-02-12 MED ORDER — DILTIAZEM HCL ER COATED BEADS 240 MG PO CP24: 240.0000 mg | ORAL_CAPSULE | Freq: Every day | ORAL | 0 refills | Status: AC

## 2018-02-12 MED ORDER — ONDANSETRON HCL 4 MG/2ML IJ SOLN: 4.0000 mg | Freq: Three times a day (TID) | INTRAMUSCULAR | 0 refills | Status: AC | PRN

## 2018-02-12 MED ORDER — AMIODARONE HCL 200 MG PO TABS: 200.0000 mg | ORAL_TABLET | Freq: Two times a day (BID) | ORAL | 0 refills | Status: AC

## 2018-02-12 NOTE — Progress Notes (Signed)
Patient discharging to [facility].  Confirmed bed at facility & faxed required docs. [facility] will be used for transport. Family aware of discharge plan.  RN call report: [place], MSW, [place] [employer] [phone]

## 2018-02-12 NOTE — Progress Notes (Signed)
PROGRESS NOTE    Cody Lane  WER:[id number] DOB: 12-25-1940 DOA: 01/20/2018 PCP: [name]    Brief Narrative:  78 year old male with a history of emphysematous COPD, systolic CHF, atrial fibrillation, recent CVA who is admitted for acute hypoxic respiratory failure. 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 He was in the process of initiating chemotherapy through the [place] system but this was deferred because he became hypoxemic and was hospitalized on these occasions. He was recently discharged and readmitted to [facility] acute hypoxic respiratory failure secondary to acute on chronic systolic CHF, knownlung cancer, chronic COPD, and community-acquired pneumonia. He completed a full course of antibiotics for CAP and has been diuresed for pulmonary edema. He underwent a thoracentesis with 900 cc of fluid removed. 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 Despite removal of fluid he continues to have increased supplemental oxygen requirements. Pulmonology and radiation oncology both consulted and recommended transfer to [facility] for palliative radiation.   His hospital course has been complicated by by hypotension, persistent atrial fibrillation with RVR, intermittent confusion, difficulty swallowing, and subacute stroke.  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 (HCC) Active Problems:   Essential hypertension   COPD (chronic obstructive pulmonary disease) with emphysema (HCC)   Chronic respiratory failure with hypoxia, on home O2 therapy (HCC)   Acute on chronic systolic heart failure (HCC)   Physical deconditioning   CAP (community acquired pneumonia)   Malnutrition of moderate degree   Goals of care,  counseling/discussion   Palliative care by specialist   Paroxysmal atrial fibrillation (HCC)   Pleural effusion   S/P thoracentesis  Acute hypoxic respiratory failure secondary to COPD exacerbation, acute on chronic systolic CHF, non-small cell lung cancer, malignant pleural effusion: Was also hospitalized at [facility] 12/31 - 1/6 for pneumonia where AFib was difficult to control as well. - Treat contributing conditions as below. - Recently noted to have decompensated with increased O2 requirements, needing bipap - Recent CXR personally reviewed. Findings of L sided white out, concerns for pleural effusion. Have requested US guided thoracentesis primarily for comfort - Appreciate assistance by Palliative Care. See note earlier. Pt now DNR/DNI - Weaned off Bipap, remains on high flow o2. Appears to have increased work of breathing. Decrease IVF  Emphysematous/bullous COPD:  - Continue BD's (xopenex). - On bronchodilators as tolerated and as needed  Squamous cell NSCLC: Per family, was slated to start chemotherapy 12/26 and was held due to hypoxia.   - XRT was started 01/31/2018 with plans for 2 weeks per Dr. Lisbeth Renshaw with aim of improving respiratory status. Continuing daily M-F. - Has referral to Dr. Julien Nordmann, establish care appointment 2/3.  - With worsening renal function, likely unable to tolerate chemo. Discussed with on-call Oncologist as well as pt's primary Oncologist - Appreciate input by Radiation Oncology. Pt is not candidate for further chemo and no further plans for radiation treatments noted. Now plan to focus on comfort -Appreciate assistance by Palliative Care. Referral to residential hospice in progress  PAF with RVR: Rate still intermittently elevated. - Had been continued on eliquis and continued metoprolol 100mg  po BID.  - Presently on amiodarone. TSH recently 1.65. K and Mg at goal. - Appears stable at present  Acute on chronic HFrEF:  -  Holding lasix 40mg  po daily  home dose with AKI and ongoing little po intake, in fact will continue supporting with sub-maintenance IV fluids, no peripheral overload or crackles noted. - Cr recently worsened, improved with IVF hydration. Pt with LE swelling. 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  - Continued on eliquis.  - Not giving statin to reduce po burden - PT earlier recommended SNF - No CES on echocardiogram - currently stable  Dysphagia: Reviewed MBS video images with patient and wife at bedside. SLP evaluations ongoing.  - Regular diet to encourage po intake per lengthy goals of care discussion at bedside today. This places at risk of aspiration but is best chance to augment nutritional intake.  - Must follow strict adjustments to minimize risk of aspiration including small bites, alt fluid/solid, chin tuck, frequent clearance, etc. - Stable at present  Acute toxic metabolic encephalopathy: Suspect due to narcotic administration. - Recently decreased dilaudid dose significantly.  - Delirium precautions - Alert and arousable off bipap  Abdominal pain: - Presently stable  Medication nonadherence: This predates this admission per his wife, though it has gotten worse and is actively impacting his care. We discussed the incongruence of wanting full life saving measures in a code situation but refusing medications at this time.  - Patient was earlier given encouragement to take medications, and will continue to seek ways to help with this.   HTN: BP not elevated. - Monitor on medications as above. Holding lisinopril, lasix, and norvasc -Stable at present  AKI:  - Minimal Urine output noted. Will check bladder scan - BUN trending up - continued on D5 at 100cc/hr, decrease to 50cc/hr secondary to swelling - renal function had been stable.  Hypernatremia: Improved with hypotonic saline -  Stable at present  Moderate protein calorie malnutrition:  - Supplement with magic cup (prefers chocolate, won't eat anything else), ensure enlive per dietitian recommendations. Will continue to offer this. He will not accept prostat, will discontinue. - Discussed with dietitian today. Consideration for alternative means of nutrition, however given worsening state, holding off currently  Hyperlipidemia:  - Continue to hold statin for now to decrease pill burden  Allergies:  - Hold antihistamine to decrease pill burden  End of Life - See previous documentation - Wishes noted to be DNR/DNI - Appreciate in put by Palliative Care and Radiation Oncology - Referral to residential hospice under way  DVT prophylaxis: eliquis Code Status: DNR Family Communication: Patient in room, family not at bedside Disposition Plan: Uncertain at this time  Consultants:   Oncology  Palliative Care  Radiation Oncology  Procedures:     Antimicrobials: Anti-infectives (From admission, onward)   Start     Dose/Rate Route Frequency Ordered Stop   01/20/18 2200  cefdinir (OMNICEF) capsule 300 mg  Status:  Discontinued     300 mg Oral 2 times daily 01/20/18 1604 01/20/18 1608   01/20/18 1700  cefdinir (OMNICEF) capsule 300 mg  Status:  Discontinued     300 mg Oral 2 times daily 01/20/18 1608 01/22/18 0704      Subjective: Cannot determine. Pt not conversant  Objective: Vitals:   02/12/18 0800 02/12/18 0900 02/12/18 1000 02/12/18 1125  BP: (!) 129/106 (!) 127/94 (!) 143/113   Pulse: 94 92 (!) 103   Resp: 17 11 20    Temp:    98.2 F (36.8 C)  TempSrc:    Axillary  SpO2: 90% 94% (!) 89%   Weight:      Height:        Intake/Output Summary (Last 24 hours) at 02/12/2018 1236 Last data filed at 02/12/2018 1000 Gross per 24 hour  Intake 2444.73 ml  Output 475 ml  Net 1969.73 ml   Filed Weights   02/10/18 0425 02/11/18 0405 02/12/18 0500  Weight: 78.3 kg 79.1 kg 80.1 kg     Examination: General exam: Awake, laying in bed, in nad Respiratory system: Increased respiratory effort, no wheezing Cardiovascular system: regular rate, s1, s2 Gastrointestinal system: Soft, nondistended, positive BS Central nervous system: CN2-12 grossly intact, strength intact Extremities: Perfused, no clubbing, trace BLE swelling Skin: Normal skin turgor, no notable skin lesions seen Psychiatry: Mood normal // no visual hallucinations    Data Reviewed: I have personally reviewed following labs and imaging studies  CBC: Recent Labs  Lab 02/07/18 0516 02/09/18 0428 02/10/18 0423 02/11/18 0359  WBC 12.5* 10.6* 11.9* 11.7*  HGB 12.2* 11.8* 10.5* 10.6*  HCT 41.1 39.7 34.4* 34.5*  MCV 99.0 101.0* 98.9 95.6  PLT 168 123* 94* 77*   Basic Metabolic Panel: Recent Labs  Lab 02/06/18 0523 02/07/18 0516 02/09/18 0428 02/10/18 0423 02/11/18 0359  NA 137 135 133* 133* 131*  K 4.3 4.3 5.6* 4.1 4.3  CL 101 102 100 101 98  CO2 26 22 26 25 27   GLUCOSE 100* 101* 132* 134* 126*  BUN 47* 45* 47* 42* 36*  CREATININE 1.29* 1.22 1.55* 1.36* 1.30*  CALCIUM 8.7* 8.6* 9.0 8.9 8.8*   GFR: Estimated Creatinine Clearance: 52.2 mL/min (A) (by C-G formula based on SCr of 1.3 mg/dL (H)). Liver Function Tests: No results for input(s): AST, ALT, ALKPHOS, BILITOT, PROT, ALBUMIN in the last 168 hours. No results for input(s): LIPASE, AMYLASE in the last 168 hours. No results for input(s): AMMONIA in the last 168 hours. Coagulation Profile: No results for input(s): INR, PROTIME in the last 168 hours. Cardiac Enzymes: No results for input(s): CKTOTAL, CKMB, CKMBINDEX, TROPONINI in the last 168 hours. BNP (last 3 results) No results for input(s): PROBNP in the last 8760 hours. HbA1C: No results for input(s): HGBA1C in the last 72 hours. CBG: No results for input(s): GLUCAP in the last 168 hours. Lipid Profile: No results for input(s): CHOL, HDL, LDLCALC, TRIG, CHOLHDL, LDLDIRECT in the  last 72 hours. Thyroid Function Tests: No results for input(s): TSH, T4TOTAL, FREET4, T3FREE, THYROIDAB in the last 72 hours. Anemia Panel: No results for input(s): VITAMINB12, FOLATE, FERRITIN, TIBC, IRON, RETICCTPCT in the last 72 hours. Sepsis Labs: No results for input(s): PROCALCITON, LATICACIDVEN in the last 168 hours.  Recent Results (from the past 240 hour(s))  MRSA PCR Screening     Status: None   Collection Time: 02/09/18  4:07 AM  Result Value Ref Range Status   MRSA by PCR NEGATIVE NEGATIVE Final    Comment:        The GeneXpert MRSA Assay (FDA approved for NASAL specimens only), is one component of a comprehensive MRSA colonization surveillance program. It is not intended to diagnose MRSA infection nor to guide or monitor treatment for MRSA infections. Performed at [facility], [place] [address] [place], [place] [address]   Culture, body fluid-bottle     Status: None (Preliminary result)   Collection Time: 02/09/18  2:49 PM  Result Value Ref Range Status   Specimen Description FLUID PLEURAL LEFT  Final   Special Requests BOTTLES DRAWN AEROBIC AND ANAEROBIC  Final   Gram Stain PENDING  Incomplete   Culture NO GROWTH 3 DAYS  Final   Report Status PENDING  Incomplete  Gram stain     Status: None   Collection Time: 02/09/18  2:49 PM  Result Value Ref Range Status   Specimen Description FLUID PLEURAL LEFT  Final   Special Requests NONE  Final   Gram Stain   Final    NO WBC SEEN NO ORGANISMS SEEN CYTOSPIN SMEAR Performed at [facility], [place] [address] [place], [place] [address]    Report Status 02/09/2018 FINAL  Final     Radiology Studies: No results found.  Scheduled Meds: . amiodarone  200 mg Oral BID  . apixaban  5 mg Oral BID  . chlorhexidine  15 mL Mouth Rinse BID  . diltiazem  240 mg Oral Daily  . feeding supplement (ENSURE ENLIVE)  237 mL Oral BID BM  . mouth rinse  15 mL Mouth Rinse BID  . methylPREDNISolone  (SOLU-MEDROL) injection  40 mg Intravenous Daily  . metoprolol tartrate  100 mg Oral BID  . sodium chloride flush  10-40 mL Intracatheter Q12H   Continuous Infusions: . dextrose 50 mL/hr at 02/12/18 1000     LOS: 23 days   Marylu Lund, MD [facility] Pager On Amion  If 7PM-7AM, please contact night-coverage 02/12/2018, 12:36 PM

## 2018-02-12 NOTE — Progress Notes (Signed)
Daily Progress Note   Patient Name: Cody Lane       Date: 02/12/2018 DOB: March 06, 1940  Age: 78 y.o. MRN#: [id number] Attending Physician: Donne Hazel, MD Primary Care Physician: [name] Admit Date: 01/20/2018  Reason for Consultation/Follow-up: Establishing goals of care  Subjective:  Patient is lying in bed. Minimal attempts to engage, but denied pain and SOB.   Wife and sister in law at the bedside.    We again discussed clinical course as well as wishes moving forward in light of his continued decline.  We discussed difference between a aggressive medical intervention path and a palliative, comfort focused care path.   Discussed regarding nutrition, aspiration risk, IVF with concern for fluid overload and third spacing as nutrition declines, and worsening of respiratory function.  We talked about options moving forward including potential for residential hospice.  She reports that she thinks that [facility] may be best option moving forward and requested to speak with hospice liaison.  Questions and concerns addressed.   PMT will continue to support holistically.   Length of Stay: 23  Current Medications: Scheduled Meds:  . amiodarone  200 mg Oral BID  . apixaban  5 mg Oral BID  . chlorhexidine  15 mL Mouth Rinse BID  . diltiazem  240 mg Oral Daily  . feeding supplement (ENSURE ENLIVE)  237 mL Oral BID BM  . mouth rinse  15 mL Mouth Rinse BID  . methylPREDNISolone (SOLU-MEDROL) injection  40 mg Intravenous Daily  . metoprolol tartrate  100 mg Oral BID  . sodium chloride flush  10-40 mL Intracatheter Q12H    Continuous Infusions: . dextrose 50 mL/hr at 02/12/18 1000    PRN Meds: acetaminophen **OR** acetaminophen, HYDROmorphone (DILAUDID) injection,  levalbuterol, metoprolol tartrate, ondansetron (ZOFRAN) IV, RESOURCE THICKENUP CLEAR, senna-docusate, sodium chloride flush  Physical Exam         Weak appearing gentleman Diminished breath sound No abdominal pain No edema More awake but does not engage in conversation Generalized weakness evident  Vital Signs: BP (!) 143/113   Pulse (!) 103   Temp 98.1 F (36.7 C) (Oral)   Resp 20   Ht 6' (1.829 m)   Wt 80.1 kg   SpO2 (!) 89%   BMI 23.95 kg/m  SpO2: SpO2: (!) 89 %  O2 Device: O2 Device: High Flow Nasal Cannula O2 Flow Rate: O2 Flow Rate (L/min): 10 L/min  Intake/output summary:   Intake/Output Summary (Last 24 hours) at 02/12/2018 1203 Last data filed at 02/12/2018 1000 Gross per 24 hour  Intake 2444.73 ml  Output 475 ml  Net 1969.73 ml   LBM: Last BM Date: 02/09/18 Baseline Weight: Weight: 76.2 kg Most recent weight: Weight: 80.1 kg      PPS 30% Palliative Assessment/Data:    Flowsheet Rows     Most Recent Value  Intake Tab  Referral Department  Hospitalist  Unit at Time of Referral  Intermediate Care Unit  Palliative Care Primary Diagnosis  Pulmonary  Date Notified  01/20/18  Palliative Care Type  New Palliative care  Reason for referral  Clarify Goals of Care, Psychosocial or Spiritual support  Date of Admission  01/20/18  Date first seen by Palliative Care  01/21/18  # of days Palliative referral response time  1 Day(s)  # of days IP prior to Palliative referral  0  Clinical Assessment  Pain Max last 24 hours  Not able to report  Pain Min Last 24 hours  Not able to report  Dyspnea Max Last 24 Hours  Not able to report  Dyspnea Min Last 24 hours  Not able to report  Nausea Max Last 24 Hours  Not able to report  Nausea Min Last 24 Hours  Not able to report  Anxiety Max Last 24 Hours  Not able to report  Anxiety Min Last 24 Hours  Not able to report  Other Max Last 24 Hours  Not able to report  Psychosocial & Spiritual Assessment  Palliative Care  Outcomes  Patient/Family meeting held?  Yes  Who was at the meeting?  pt and wife  Palliative Care follow-up planned  Yes, Facility      Patient Active Problem List   Diagnosis Date Noted  . S/P thoracentesis   . Pleural effusion   . Paroxysmal atrial fibrillation (HCC)   . Acute on chronic systolic heart failure ([place]) 01/21/2018  . Physical deconditioning 01/21/2018  . CAP (community acquired pneumonia) 01/21/2018  . Malnutrition of moderate degree 01/21/2018  . Goals of care, counseling/discussion   . Palliative care by specialist   . Chest pain 01/20/2018  . Primary malignant neoplasm of bronchus of left lower lobe ([place]) 01/20/2018  . COPD (chronic obstructive pulmonary disease) with emphysema ([place]) 01/20/2018  . Chronic respiratory failure with hypoxia, on home O2 therapy ([place]) 01/20/2018  . Vertigo 10/15/2017  . Diplopia 10/15/2017  . Cough   . Aortic valve insufficiency S/P aortic valve replacement 03/26/2014  . Kidney mass 03/26/2014  . Essential hypertension 03/26/2014  . Permanent atrial fibrillation 03/26/2014  . Long term current use of anticoagulant therapy 03/26/2014  . Hyperlipidemia 03/26/2014  . CHF (congestive heart failure) ([place]) 03/26/2014  . Insomnia 03/26/2014    Palliative Care Assessment & Plan   Patient Profile:  78 year old male with a history of emphysematous COPD, systolic CHF, atrial fibrillation, recent CVA who is admitted for acute hypoxic respiratory failure. 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 He was in the process of initiating chemotherapy through the [place] system but this was deferred because he became hypoxemic and was hospitalized.   He was recently discharged and readmitted to [facility] acute hypoxic respiratory failure secondary to acute on chronic systolic CHF, knownlung cancer,  chronic COPD, and community-acquired  pneumonia. He completed a full course of antibiotics for CAP and has been diuresed for pulmonary edema. He underwent a thoracentesis with 900 cc of fluid removed. 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 Despite removal of fluid he continued to have increased supplemental oxygen requirements. Pulmonology and radiation oncology both consulted and recommendeded transfer to [facility] for palliative radiation.   His hospital course has been complicated by by hypotension, persistent atrial fibrillation with RVR, intermittent confusion, difficulty swallowing, and subacute stroke.  Assessment:  ongoing functional decline Ongoing dysphagia Ongoing cancer related cachexia Patient refusing radiation/other treatments at times Abdominal pain.   Recommendations/Plan:  Continue IV dilaudid to be used PRN.  DNR/DNI  Discussed again with his wife that he is not a candidate for further disease modifying therapy and the way to add as much time in quality to his life is through aggressive symptom management.  Discussed prognosis of likely weeks at best with real possibility of acute worsening and recommended consideration for residential hospice as he in not going to maintain his nutrition, is going to continue to aspirate, and may develop high symptom burden again (significant distress earlier this week prior to thoracentesis).  Referral placed to SW for [facility] referral.  Would hold on repeat thoracentesis for now, but will continue to evaluate if this may be beneficial if he is not able to transition to [facility] this weekend.      Goals of Care and Additional Recommendations:  Limitations on Scope of Treatment: Transitioning to comfort care.   Prognosis:  > 2 weeks.  He has tenuous respiratory status and is aspirating.  With a focus on comfort, he will either fail to have adequate nutrition  or , more likely, develop recurrent respiratory failure/aspiration in the next few days.  I anticipate his prognosis to be days to weeks and shared this with his wife today.  Discharge Planning:  Hospice facility?  Care plan was discussed with  Patient's wife  Thank you for allowing the Palliative Medicine Team to assist in the care of this patient.   Total Time 40 Prolonged Time Billed  no       Greater than 50%  of this time was spent counseling and coordinating care related to the above assessment and plan.  Micheline Rough, MD  Please contact Palliative Medicine Team phone at [phone] for questions and concerns.

## 2018-02-12 NOTE — Progress Notes (Signed)
Report called to [facility] placed. Pt discharged with PTAR to [facility] place.

## 2018-02-12 NOTE — Progress Notes (Signed)
Daily Progress Note   Patient Name: Cody Lane       Date: 02/12/2018 DOB: 02-Nov-1940  Age: 78 y.o. MRN#: [id number] Attending Physician: Donne Hazel, MD Primary Care Physician: [name] Admit Date: 01/20/2018  Reason for Consultation/Follow-up: Establishing goals of care  Subjective:  Patient is lying in bed.  Off bipap and acknowledges examiner as enters room. Minimal attempts to engage, but endorsed pain and SOB.   Wife, sister in law, and nephew at the bedside.  His wife reports understanding that he is not a good candidate for further disease modifying therapy (radiation, chemo, etc) and we began discussion that the best way forward in light of this fact is through aggressive symptom management and a focus on him being as comfortable as possible.  She reports that continuing to control symptoms such as pain and shortness of breath is very important to family, however, she seems hesitant when discussing de-escalation of current interventions.  We therefore made a plan to continue to see how he does overnight, treat pain and shortness of breath aggressively as needed, and meet again tomorrow at 10 AM to continue conversation.   Length of Stay: 23  Current Medications: Scheduled Meds:  . amiodarone  200 mg Oral BID  . apixaban  5 mg Oral BID  . chlorhexidine  15 mL Mouth Rinse BID  . diltiazem  240 mg Oral Daily  . feeding supplement (ENSURE ENLIVE)  237 mL Oral BID BM  . mouth rinse  15 mL Mouth Rinse BID  . methylPREDNISolone (SOLU-MEDROL) injection  40 mg Intravenous Daily  . metoprolol tartrate  100 mg Oral BID  . sodium chloride flush  10-40 mL Intracatheter Q12H    Continuous Infusions: . dextrose 50 mL/hr at 02/12/18 0827    PRN Meds: acetaminophen  **OR** acetaminophen, HYDROmorphone (DILAUDID) injection, levalbuterol, metoprolol tartrate, ondansetron (ZOFRAN) IV, RESOURCE THICKENUP CLEAR, senna-docusate, sodium chloride flush  Physical Exam         Weak appearing gentleman Diminished breath sound No abdominal pain No edema More awake but does not engage in conversation Generalized weakness evident  Vital Signs: BP (!) 129/106   Pulse 94   Temp 97.8 F (36.6 C) (Axillary)   Resp 17   Ht 6' (1.829 m)   Wt 80.1 kg   SpO2  90%   BMI 23.95 kg/m  SpO2: SpO2: 90 % O2 Device: O2 Device: High Flow Nasal Cannula O2 Flow Rate: O2 Flow Rate (L/min): 10 L/min  Intake/output summary:   Intake/Output Summary (Last 24 hours) at 02/12/2018 4098 Last data filed at 02/12/2018 0827 Gross per 24 hour  Intake 2366.4 ml  Output 475 ml  Net 1891.4 ml   LBM: Last BM Date: 02/09/18 Baseline Weight: Weight: 76.2 kg Most recent weight: Weight: 80.1 kg      PPS 30% Palliative Assessment/Data:    Flowsheet Rows     Most Recent Value  Intake Tab  Referral Department  Hospitalist  Unit at Time of Referral  Intermediate Care Unit  Palliative Care Primary Diagnosis  Pulmonary  Date Notified  01/20/18  Palliative Care Type  New Palliative care  Reason for referral  Clarify Goals of Care, Psychosocial or Spiritual support  Date of Admission  01/20/18  Date first seen by Palliative Care  01/21/18  # of days Palliative referral response time  1 Day(s)  # of days IP prior to Palliative referral  0  Clinical Assessment  Pain Max last 24 hours  Not able to report  Pain Min Last 24 hours  Not able to report  Dyspnea Max Last 24 Hours  Not able to report  Dyspnea Min Last 24 hours  Not able to report  Nausea Max Last 24 Hours  Not able to report  Nausea Min Last 24 Hours  Not able to report  Anxiety Max Last 24 Hours  Not able to report  Anxiety Min Last 24 Hours  Not able to report  Other Max Last 24 Hours  Not able to report  Psychosocial &  Spiritual Assessment  Palliative Care Outcomes  Patient/Family meeting held?  Yes  Who was at the meeting?  pt and wife  Palliative Care follow-up planned  Yes, Facility      Patient Active Problem List   Diagnosis Date Noted  . S/P thoracentesis   . Pleural effusion   . Paroxysmal atrial fibrillation (HCC)   . Acute on chronic systolic heart failure ([place]) 01/21/2018  . Physical deconditioning 01/21/2018  . CAP (community acquired pneumonia) 01/21/2018  . Malnutrition of moderate degree 01/21/2018  . Goals of care, counseling/discussion   . Palliative care by specialist   . Chest pain 01/20/2018  . 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 COPD (chronic obstructive pulmonary disease) with emphysema ([place]) 01/20/2018  . Chronic respiratory failure with hypoxia, on home O2 therapy ([place]) 01/20/2018  . Vertigo 10/15/2017  . Diplopia 10/15/2017  . Cough   . Aortic valve insufficiency S/P aortic valve replacement 03/26/2014  . Kidney mass 03/26/2014  . Essential hypertension 03/26/2014  . Permanent atrial fibrillation 03/26/2014  . Long term current use of anticoagulant therapy 03/26/2014  . Hyperlipidemia 03/26/2014  . CHF (congestive heart failure) ([place]) 03/26/2014  . Insomnia 03/26/2014    Palliative Care Assessment & Plan   Patient Profile:  78 year old male with a history of emphysematous COPD, systolic CHF, atrial fibrillation, recent CVA who is admitted for acute hypoxic respiratory failure. 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 He was in the process of initiating chemotherapy through the [place] system but this was deferred because he became hypoxemic and was hospitalized.   He was recently discharged and readmitted to [facility] acute hypoxic  respiratory failure secondary to acute on chronic systolic CHF, knownlung cancer,  chronic COPD, and community-acquired pneumonia. He completed a full course of antibiotics for CAP and has been diuresed for pulmonary edema. He underwent a thoracentesis with 900 cc of fluid removed. 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 Despite removal of fluid he continued to have increased supplemental oxygen requirements. Pulmonology and radiation oncology both consulted and recommendeded transfer to [facility] for palliative radiation.   His hospital course has been complicated by by hypotension, persistent atrial fibrillation with RVR, intermittent confusion, difficulty swallowing, and subacute stroke.  Assessment:  ongoing functional decline Ongoing dysphagia Ongoing cancer related cachexia Patient refusing radiation/other treatments at times Abdominal pain.   Recommendations/Plan:  Continue IV dilaudid to be used PRN.  DNR/DNI  Off Bipap currently and more awake today.  Discussed with his wife that he is not a candidate for further disease modifying therapy and the way to add as much time in quality to his life is through aggressive symptom management.  She is open to this conversation, however, she gets overwhelmed when discussing consideration for de-escalating current interventions.    PMT to follow to continue to progress goals conversation based on clinical course.  Plan for follow-up meeting tomorrow at 10 AM     Goals of Care and Additional Recommendations:  Limitations on Scope of Treatment: Full Scope Treatment   Prognosis:   guarded  Discharge Planning:  To Be Determined  Care plan was discussed with  Patient's wife  Thank you for allowing the Palliative Medicine Team to assist in the care of this patient.   Total Time 30 Prolonged Time Billed  no       Greater than 50%  of this time was spent counseling and coordinating care related to the above  assessment and plan.  Micheline Rough, MD  Please contact Palliative Medicine Team phone at [phone] for questions and concerns.

## 2018-02-12 NOTE — Progress Notes (Signed)
[facility] room available for Mr. Meints today. Paperwork completed with spouse [name]. Appreciate report from RN and Dr. Domingo Cocking.   Discharge summary has been routed.   RN please call report to [phone].  Thank you,  Erling Conte, LCSW [phone]

## 2018-02-12 NOTE — Progress Notes (Signed)
CSW consulted for residential hospice placement [facility]. Sent referral to liaison, Harmon Pier, who will f/u with family.   Pricilla Holm, MSW, [place] [facility] [phone]

## 2018-02-12 NOTE — Discharge Summary (Signed)
Physician Discharge Summary  Cody Lane TGG:[id number] DOB: 08-Dec-1940 DOA: 01/20/2018  PCP: [name]  Admit date: 01/20/2018 Discharge date: 02/12/2018  Admitted From: Home Disposition:  [place] [facility]  Recommendations for Outpatient Follow-up:  1. Follow up with Hospice physician as needed  Discharge Condition:Stable CODE STATUS:DNR Diet recommendation: Comfort   Brief/Interim Summary: 78 year old male with a history of emphysematous COPD, systolic CHF, atrial fibrillation, recent CVA who is admitted for acute hypoxic respiratory failure. 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 He was in the process of initiating chemotherapy through the [place] system but this was deferred because he became hypoxemic and was hospitalized on these occasions. He was recently discharged and readmitted to [facility] acute hypoxic respiratory failure secondary to acute on chronic systolic CHF, knownlung cancer, chronic COPD, and community-acquired pneumonia. He completed a full course of antibiotics for CAP and has been diuresed for pulmonary edema. He underwent a thoracentesis with 900 cc of fluid removed. 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 Despite removal of fluid he continues to have increased supplemental oxygen requirements. Pulmonology and radiation oncology both consulted and recommended transfer to [facility] for palliative radiation.   His hospital course has been complicated by by hypotension, persistent atrial fibrillation with RVR, intermittent confusion, difficulty swallowing, and subacute stroke.  Discharge Diagnoses:  Principal Problem:   Primary malignant neoplasm of bronchus of left lower lobe (HCC) Active Problems:   Essential hypertension   COPD (chronic  obstructive pulmonary disease) with emphysema (HCC)   Chronic respiratory failure with hypoxia, on home O2 therapy (HCC)   Acute on chronic systolic heart failure (HCC)   Physical deconditioning   CAP (community acquired pneumonia)   Malnutrition of moderate degree   Goals of care, counseling/discussion   Palliative care by specialist   Paroxysmal atrial fibrillation (HCC)   Pleural effusion   S/P thoracentesis  Acute hypoxic respiratory failure secondary to COPD exacerbation, acute on chronic systolic CHF, non-small cell lung cancer, malignant pleural effusion: Was also hospitalized at [facility] 12/31 - 1/6 for pneumonia where AFib was difficult to control as well. - Treat contributing conditions as below. - Recently noted to have decompensated with increased O2 requirements, needing bipap - Recent CXR personally reviewed. Findings of L sided white out, concerns for pleural effusion. Have requested US guided thoracentesis primarily for comfort - Appreciate assistance by Palliative Care. See note earlier. Pt now DNR/DNI - Weaned off Bipap, remains on high flow o2. Appears to have increased work of breathing. Decrease IVF  Emphysematous/bullous COPD:  - Continue BD's(xopenex). - On bronchodilators as tolerated and as needed  Squamous cell NSCLC: Per family, was slated to start chemotherapy 12/26 and was held due to hypoxia.  - XRT was started 01/31/2018 with plans for 2 weeks per Dr. Lisbeth Renshaw with aim of improving respiratory status. Continuing daily M-F. - Has referral to Dr. Julien Nordmann, establish care appointment 2/3. - With worsening renal function, likely unable to tolerate chemo. Discussed with on-call Oncologist as well as pt's primary Oncologist - Appreciate input by Radiation Oncology. Pt is not candidate for further chemo and no further plans for radiation treatments noted. Now plan to focus on comfort -Appreciate assistance by Palliative Care. Plan d/c to residential  hospice  PAF with VOJ:JKKX still intermittently elevated. - Had been continued on eliquis and continued metoprolol  100mg  po BID.  - Presently onamiodarone. TSH recently 1.65. K and Mg at goal. - Appears stable at present  Acute on chronic HFrEF:  - Holding lasix 40mg  po daily home dose with AKI and ongoing little po intake, in fact will continue supporting with sub-maintenance IV fluids, no peripheral overload or crackles noted. - Cr recently worsened, improved with IVF hydration. Pt with LE swelling. Decrease IVF rate to 50cc/hr. 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  - Continued on eliquis.  - Not giving statin to reduce po burden - PT earlier recommended SNF - No CES on echocardiogram - currently stable  Dysphagia: Reviewed MBS video images with patient and wife at bedside.SLP evaluations ongoing. - Regular diet to encourage po intakeper lengthy goals of care discussion at bedside today. This places at risk of aspiration but is best chance to augment nutritional intake. - Must follow strict adjustments to minimize risk of aspiration including small bites, alt fluid/solid, chin tuck, frequent clearance, etc. - Stable at present  Acute toxic metabolic encephalopathy: Suspect due to narcotic administration. - Recently decreased dilaudid dose significantly.  - Delirium precautions - Alert and arousable off bipap  Abdominal pain: - Presently stable  Medication nonadherence: This predates this admission per his wife, though it has gotten worse and is actively impacting his care. We discussed the incongruence of wanting full life saving measures in a code situation but refusing medications at this time.  - Patient was earlier given encouragement to take medications, and will continue to seek ways to help with this.   HTN: BP not elevated. - Monitor on  medications as above. Holding lisinopril, lasix, and norvasc -Stable at present  AKI:  - Minimal Urine output noted. Will check bladder scan - BUN trending up - continued on D5 at 100cc/hr, decrease to 50cc/hr secondary to swelling - renal function had been stable.  Hypernatremia: Improved with hypotonic saline - Stable at present  Moderate protein calorie malnutrition:  - Supplement with magic cup (prefers chocolate, won't eat anything else), ensure enlive per dietitian recommendations. Will continue to offer this. He will not accept prostat, will discontinue. - Discussed with dietitian today. Consideration for alternative means of nutrition, however given worsening state, holding off currently  Hyperlipidemia:  - Continue to hold statin for now to decrease pill burden  Allergies:  - Hold antihistamine to decrease pill burden  End of Life - See previous documentation - Wishes noted to be DNR/DNI - Appreciate in put by Palliative Care and Radiation Oncology - Plan to d/c to residential hospice   Discharge Instructions   Allergies as of 02/12/2018   No Known Allergies     Medication List    STOP taking these medications   albuterol (2.5 MG/3ML) 0.083% nebulizer solution Commonly known as:  PROVENTIL   albuterol 108 (90 Base) MCG/ACT inhaler Commonly known as:  PROVENTIL HFA;VENTOLIN HFA   amLODipine 10 MG tablet Commonly known as:  NORVASC   atorvastatin 40 MG tablet Commonly known as:  LIPITOR   cefdinir 300 MG capsule Commonly known as:  OMNICEF   diltiazem 60 MG tablet Commonly known as:  CARDIZEM   lisinopril 2.5 MG tablet Commonly known as:  PRINIVIL,ZESTRIL   ondansetron 4 MG disintegrating tablet Commonly known as:  ZOFRAN-ODT     TAKE these medications   amiodarone 200 MG tablet Commonly known as:  PACERONE Take 1 tablet (200 mg total)  by mouth 2 (two) times daily.   apixaban 5 MG Tabs tablet Commonly known as:  ELIQUIS Take 1 tablet  (5 mg total) by mouth 2 (two) times daily.   diltiazem 240 MG 24 hr capsule Commonly known as:  CARDIZEM CD Take 1 capsule (240 mg total) by mouth daily. Start taking on:  February 13, 2018   furosemide 40 MG tablet Commonly known as:  LASIX Take 40 mg by mouth daily.   gabapentin 400 MG capsule Commonly known as:  NEURONTIN Take 400 mg by mouth 2 (two) times daily.   loratadine 10 MG tablet Commonly known as:  CLARITIN Take 10 mg by mouth daily.   metoprolol tartrate 25 MG tablet Commonly known as:  LOPRESSOR Take 37.5 mg by mouth 2 (two) times daily.   multivitamin with minerals tablet Take 1 tablet by mouth daily.   ondansetron 4 MG/2ML Soln injection Commonly known as:  ZOFRAN Inject 2 mLs (4 mg total) into the vein every 8 (eight) hours as needed for nausea or vomiting.   tiotropium 18 MCG inhalation capsule Commonly known as:  SPIRIVA Place 1 capsule into inhaler and inhale 2 (two) times daily.       Contact information for follow-up providers    Hospice physician as needed Follow up.            Contact information for after-discharge care    Destination    [facility] Preferred SNF .   Service:  Skilled Nursing Contact information: [place] [phone]                 No Known Allergies  Consultations:  Oncology  Radiation oncology  Palliative Care  Procedures/Studies: Dg Chest 1 View  Result Date: 02/09/2018 CLINICAL DATA:  Status post LEFT thoracentesis. EXAM: CHEST  1 VIEW COMPARISON:  Chest radiograph February 09, 2018 at 0226 hours and CT chest December 31, 2017. FINDINGS: LEFT lung base out of field of view. Small bilateral pleural effusions, improved on the LEFT. Diffuse interstitial prominence. Subcentimeter nodular densities projecting LEFT lung similar to prior CT. No pneumothorax. 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  Advanced LEFT glenohumeral osteoarthrosis. IMPRESSION: 1. Small residual pleural effusions, no pneumothorax status post thoracentesis. 2. Cardiomegaly and interstitial prominence concerning for chronic interstitial changes and superimposed pulmonary edema. 3. Similar nodular densities LEFT lung. Electronically Signed   By: Elon Alas M.D.   On: 02/09/2018 15:48   Dg Chest 1 View  Result Date: 01/31/2018 CLINICAL DATA:  78 year old male with a history of hypoxia EXAM: CHEST  1 VIEW COMPARISON:  01/26/2018, 01/20/2010 FINDINGS: Cardiomediastinal silhouette unchanged in size and contour. Worsening mixed interstitial and airspace disease of the left lung. Partial obscuration of the left hemidiaphragm. Left lung base has been excluded from the exam. No pneumothorax. Redemonstration of emphysematous/bullous changes of the right lung with mild interstitial opacities. Surgical changes of median sternotomy and aortic valve repair. No displaced fracture. IMPRESSION: Worsening mixed interstitial and airspace disease of the left lung compared to the prior plain film. Left pleural effusion not excluded. Surgical changes of median sternotomy and aortic valve repair. Electronically Signed   By: Corrie Mckusick D.O.   On: 01/31/2018 13:47   Dg Chest 2 View  Result Date: 01/20/2018 CLINICAL DATA:  Left chest pain. EXAM: CHEST - 2 VIEW COMPARISON:  12/24/2017 FINDINGS: Previous median sternotomy CABG procedure. Aortic atherosclerosis. Small bilateral pleural effusions and mild diffuse  interstitial edema identified. Asymmetric opacification of the left lower lobe is identified which may reflect asymmetric edema, pneumonia or postobstructive pneumonitis secondary to known central hilar mass. IMPRESSION: 1. Cardiac enlargement and CHF. 2. Asymmetric opacification of the left lower lobe which may represent asymmetric edema, pneumonia versus progressive postobstructive pneumonitis secondary to known central left hilar lung  mass. Electronically Signed   By: Kerby Moors M.D.   On: 01/20/2018 09:01   Ct Head Wo Contrast  Result Date: 01/25/2018 CLINICAL DATA:  Mental status changes, speech difficulty, history lung cancer, CHF, AVR, stroke, atrial fibrillation, COPD, hypertension, coronary artery disease post MI EXAM: CT HEAD WITHOUT CONTRAST TECHNIQUE: Contiguous axial images were obtained from the base of the skull through the vertex without intravenous contrast. Sagittal and coronal MPR images reconstructed from axial data set. COMPARISON:  10/15/2017 FINDINGS: Brain: Generalized atrophy. Normal ventricular morphology. No midline shift or mass effect. Small vessel chronic ischemic changes of deep cerebral white matter. Lacunar infarct RIGHT basal ganglia age indeterminate but new since prior exam. Old lacunar infarct LEFT thalamus. No intracranial hemorrhage or focal mass lesion. No extra-axial fluid collections. Vascular: Unremarkable Skull: Intact Sinuses/Orbits: Clear Other: N/A IMPRESSION: Atrophy with small vessel chronic ischemic changes of deep cerebral white matter. Age-indeterminate lacunar infarct RIGHT basal ganglia, new since 10/15/2017. Old LEFT thalamic lacunar infarct. No intracranial hemorrhage identified. Electronically Signed   By: Lavonia Dana M.D.   On: 01/25/2018 18:47   Mr Jeri Cos RD Contrast  Result Date: 01/27/2018 CLINICAL DATA:  78 y/o M; mental status changes, speech difficulty, history of lung cancer. EXAM: MRI HEAD WITHOUT AND WITH CONTRAST TECHNIQUE: Multiplanar, multiecho pulse sequences of the brain and surrounding structures were obtained without and with intravenous contrast. CONTRAST:  7 cc Gadavist COMPARISON:  01/25/2018 CT head.  10/28/2017 MRI head. FINDINGS: Brain: Motion degraded study. 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 Small chronic lacunar infarcts are present within the left thalamus and bilateral caudate heads. Small chronic infarct of  the left anterior corona radiata. Stable confluent nonspecific T2 FLAIR hyperintensities in subcortical and periventricular white matter are compatible with moderate to severe chronic microvascular ischemic changes for age. Moderate volume loss of the brain. Numerous stable scattered foci of susceptibility hypointensity compatible with chronic microhemorrhage, central predominance, likely related to chronic hypertension. No abnormal enhancement of the brain. Small linear foci of reduced diffusion are present within the posterior frontal centrum semiovale bilaterally (series 8, image 16) compatible with acute/early subacute infarctions. A few additional punctate foci in white matter may be present bilaterally. Vascular: Normal flow voids. Skull and upper cervical spine: Normal marrow signal. Sinuses/Orbits: Negative. Other: Bilateral intra-ocular lens replacement. IMPRESSION: 1. Small acute/early subacute infarctions within the bilateral posterior frontal lobe centrum semiovale. No hemorrhage or mass effect. Few probable additional scattered punctate foci of infarction, partially obscured by motion artifact. Distribution favors watershed infarction. 2. Stable moderate to severe chronic microvascular ischemic changes and moderate volume loss of the brain. 3. Stable foci of chronic microhemorrhage, likely related to chronic hypertension. 4. No intracranial metastasis identified. These results will be called to the ordering clinician or representative by the Radiologist Assistant, and communication documented in the PACS or zVision Dashboard. Electronically Signed   By: Kristine Garbe M.D.   On: 01/27/2018 16:09   Dg Chest Port 1 View  Result Date: 02/09/2018 CLINICAL DATA:  Pleural effusion EXAM: PORTABLE CHEST 1 VIEW COMPARISON:  02/04/2018, 01/31/2018, 01/26/2018 FINDINGS: Post sternotomy changes. Interval white out of the  left hemithorax. Development of right pleural effusion with right basilar  airspace disease. Cardiomediastinal silhouette is largely obscured. IMPRESSION: Interval complete opacification of the left thorax which may be secondary to large effusion and or combination of effusion and underlying parenchymal lung disease. Development of at least small right pleural effusion with right basilar atelectasis or infiltrate. Electronically Signed   By: Donavan Foil M.D.   On: 02/09/2018 03:01   Dg Chest Port 1 View  Result Date: 02/04/2018 CLINICAL DATA:  Respiratory distress. EXAM: PORTABLE CHEST 1 VIEW COMPARISON:  01/31/2018. FINDINGS: Prior CABG and cardiac valve replacement. Severe cardiomegaly. Progressive diffuse left lung infiltrate right lung remains clear. Left-sided pleural effusion most likely present. No pneumothorax. No acute bony abnormality. IMPRESSION: 1. Progressive diffuse left lung infiltrate. Left-sided pleural effusion also most likely present. 2. Prior CABG and cardiac valve replacement. Severe cardiomegaly again noted. Electronically Signed   By: Marcello Moores  Register   On: 02/04/2018 07:35   Dg Chest Port 1 View  Result Date: 01/26/2018 CLINICAL DATA:  .  Left thoracentesis. EXAM: PORTABLE CHEST 1 VIEW COMPARISON:  Chest x-ray dated January 20, 2018. FINDINGS: Stable cardiomegaly status post CABG. Mild diffuse interstitial edema is similar to prior study. Unchanged small right pleural effusion. No definite residual left pleural effusion status post thoracentesis. Patchy increased density in the left lower lobe is unchanged. No pneumothorax. No acute osseous abnormality. IMPRESSION: 1. No definite residual left pleural effusion status post thoracentesis. No pneumothorax. 2. Unchanged interstitial pulmonary edema and left lower lobe opacity which could reflect alveolar edema or pneumonia. Electronically Signed   By: Titus Dubin M.D.   On: 01/26/2018 13:34   Dg Swallowing Func-speech Pathology  Result Date: 01/29/2018 Objective Swallowing Evaluation: Type of Study:  MBS-Modified Barium Swallow Study  Patient Details Name: Cody Lane MRN: [id number] Date of Birth: 06/23/1940 Today's Date: 01/29/2018 Time: SLP Start Time (ACUTE ONLY): 1401 -SLP Stop Time (ACUTE ONLY): 1424 SLP Time Calculation (min) (ACUTE ONLY): 23 min Past Medical History: Past Medical History: Diagnosis Date . Aortic valve regurgitation 01/26/14 . Atrial fibrillation ([place])  . 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 CHF (congestive heart failure) ([place]) 01/26/2014 . COPD (chronic obstructive pulmonary disease) ([place])  . CVA (cerebral vascular accident) ([place]) 10/2017  "left side still weak" (01/20/2018) . Emphysema (subcutaneous) (surgical) resulting from a procedure  . Heart murmur  . Hypertension  . 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 Lung cancer ([place]) dx'd 12/2017 . Lung mass  . Mitral valve regurgitation 01/26/2014 . Myocardial infarction ([place])   "I've had 2; last 1 was ~ 1 wk ago" (01/20/2018) . On home oxygen therapy   "2L; 24/7" (01/20/2018) . Pneumonia   "several times" (01/20/2018) . Pseudophakia 08/17/2013 . 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 Thoracic aortic aneurysm ([place])  . Vitamin D deficiency  Past Surgical History: Past Surgical History: Procedure Laterality Date . AORTIC VALVE REPLACEMENT   . CARDIAC VALVE REPLACEMENT   . CATARACT EXTRACTION W/ INTRAOCULAR LENS  IMPLANT, BILATERAL   . CORONARY ANGIOPLASTY WITH STENT PLACEMENT    "I have 2 stents" (01/20/2018) . INGUINAL HERNIA REPAIR Left  . IR THORACENTESIS ASP PLEURAL SPACE W/IMG GUIDE  01/26/2018 HPI: 78 year old male admitted 01/20/2018 PMH: non-small cell lung cancer, COPD, emphysema, AFib, AVR, aneurysm, CHF, HTN, A&MVRegurg, HLD, recurrent PNA. Recent hospitalization for hypoxia and PNA amd acute metabolic encephalopathy. CXR = interstitial pulmonary edema and left lower lobe opacity which could reflect alveolar edema or pneumonia; BSE completed on 01/28/18 indicating need for  MBS d/t overt s/s of aspiration.  Subjective: Pt confused/requires cueing during MBS Assessment / Plan /  Recommendation CHL IP CLINICAL IMPRESSIONS 01/29/2018 Clinical Impression Mild oropharyngeal dysphagia characterized by lingual pumping with puree consistency, oral holding prior to swallow with premature spillage to valleculae/pyriform sinuses placing pt at mild-moderate risk for aspiration during intake d/t decreased mentation/efficiency of swallow; flash penetration with larger boluses of thin/nectar  Noted x1, but eliminated with smaller amounts given; mild vallecular residue noted, but eliminated with repetitive swallows and effortful swallow and alternating solids/liquids; potential esophageal component with backflow possibility (difficult to assess d/t pt positioning); pt coughing during intake, but no material appeared to enter airway during MBS; fatigue factor may impact meal tolerance and consumption to maintain nutrition/hydration; pt will continue to be followed by ST for diet tolerance and upgrade when mentation/strength improved; recommend Dysphagia 1/nectar-thick liquids d/t mentation/efficiency of swallow function. SLP Visit Diagnosis Dysphagia, oropharyngeal phase (R13.12) Attention and concentration deficit following -- Frontal lobe and executive function deficit following -- Impact on safety and function Mild-moderate aspiration risk.   CHL IP TREATMENT RECOMMENDATION 01/29/2018 Treatment Recommendations Therapy as outlined in treatment plan below   Prognosis 01/29/2018 Prognosis for Safe Diet Advancement Guarded Barriers to Reach Goals Cognitive deficits;Time post onset;Severity of deficits Barriers/Prognosis Comment -- CHL IP DIET RECOMMENDATION 01/29/2018 SLP Diet Recommendations Dysphagia 1 (Puree) solids;Nectar thick liquid;Other (small amounts) Liquid Administration via Cup;Other (Comment);Straw Medication Administration Crushed with puree Compensations Minimize environmental distractions;Slow rate;Small sips/bites;Follow solids with liquid;Multiple dry swallows after each bite/sip;Effortful  swallow Postural Changes Seated upright at 90 degrees   CHL IP OTHER RECOMMENDATIONS 01/29/2018 Recommended Consults Consider esophageal assessment Oral Care Recommendations Oral care BID Other Recommendations Order thickener from pharmacy   CHL IP FOLLOW UP RECOMMENDATIONS 01/29/2018 Follow up Recommendations 24 hour supervision/assistance;Skilled Nursing facility   [facility] IP FREQUENCY AND DURATION 01/29/2018 Speech Therapy Frequency (ACUTE ONLY) min 1 x/week Treatment Duration 1 week;2 weeks      CHL IP ORAL PHASE 01/29/2018 Oral Phase Impaired Oral - Pudding Teaspoon -- Oral - Pudding Cup -- Oral - Honey Teaspoon -- Oral - Honey Cup -- Oral - Nectar Teaspoon -- Oral - Nectar Cup -- Oral - Nectar Straw -- Oral - Thin Teaspoon Premature spillage Oral - Thin Cup Premature spillage Oral - Thin Straw Premature spillage Oral - Puree Lingual pumping;Delayed oral transit Oral - Mech Soft -- Oral - Regular -- Oral - Multi-Consistency -- Oral - Pill -- Oral Phase - Comment --  CHL IP PHARYNGEAL PHASE 01/29/2018 Pharyngeal Phase Impaired Pharyngeal- Pudding Teaspoon -- Pharyngeal -- Pharyngeal- Pudding Cup -- Pharyngeal -- Pharyngeal- Honey Teaspoon -- Pharyngeal -- Pharyngeal- Honey Cup -- Pharyngeal -- Pharyngeal- Nectar Teaspoon Delayed swallow initiation-vallecula Pharyngeal -- Pharyngeal- Nectar Cup -- Pharyngeal -- Pharyngeal- Nectar Straw Delayed swallow initiation-vallecula;Delayed swallow initiation-pyriform sinuses Pharyngeal -- Pharyngeal- Thin Teaspoon Delayed swallow initiation-pyriform sinuses Pharyngeal -- Pharyngeal- Thin Cup -- Pharyngeal -- Pharyngeal- Thin Straw Delayed swallow initiation-pyriform sinuses;Penetration/Aspiration during swallow Pharyngeal Material enters airway, remains ABOVE vocal cords then ejected out Pharyngeal- Puree Delayed swallow initiation-vallecula;Pharyngeal residue - valleculae;Compensatory strategies attempted (with notebox);Other (alternated liquids/solids, effortful swallow,  repetitive swallow) Pharyngeal -- Pharyngeal- Mechanical Soft Other (Attempted, but unable to be fully assessed d/t pt non-compliance d/t mentation) Pharyngeal -- Pharyngeal- Regular -- Pharyngeal -- Pharyngeal- Multi-consistency -- Pharyngeal -- Pharyngeal- Pill -- Pharyngeal -- Pharyngeal Comment --  CHL IP CERVICAL ESOPHAGEAL PHASE 01/29/2018 Cervical Esophageal Phase Impaired Pudding Teaspoon -- Pudding Cup -- Honey Teaspoon -- Honey Cup -- [employer]  Teaspoon -- Nectar Cup -- Nectar Straw -- Thin Teaspoon -- Thin Cup -- Thin Straw Other (Comment) Puree Esophageal backflow into cervical esophagus;Other (DTA with positioning of pt, but coughing noted intermittently during intake; potential for backflow primarily with larger boluses and timing of swallow initiation) Mechanical Soft -- Regular -- Multi-consistency -- Pill -- Cervical Esophageal Comment -- Elvina Sidle, M.S., CCC-SLP 01/29/2018, 4:58 PM              Ir Thoracentesis Asp Pleural Space W/img Guide  Result Date: 01/26/2018 INDICATION: Patient with history of paroxysmal a.fib with RVR, AKI, HTN, acute hypoxic respiratory failure - request for diagnostic and therapeutic thoracentesis today at bedside. EXAM: ULTRASOUND GUIDED LEFT THORACENTESIS MEDICATIONS: 10 mL 1% lidocaine COMPLICATIONS: None immediate. PROCEDURE: An ultrasound guided thoracentesis was thoroughly discussed with the patient's wife and questions answered. The benefits, risks, alternatives and complications were also discussed. The patient's wife understands and wishes to proceed with the procedure. Written consent was obtained. Ultrasound was performed to localize and mark an adequate pocket of fluid in the left chest. The area was then prepped and draped in the normal sterile fashion. 1% Lidocaine was used for local anesthesia. Under ultrasound guidance a 6 Fr Safe-T-Centesis catheter was introduced. Thoracentesis was performed. The catheter was removed and a dressing applied. FINDINGS: A  total of approximately 900 mL of clear yellow fluid was removed. Samples were sent to the laboratory as requested by the clinical team. IMPRESSION: Successful ultrasound guided left thoracentesis yielding 900 mL of pleural fluid. Read by Candiss Norse, PA-C Electronically Signed   By: Aletta Edouard M.D.   On: 01/26/2018 13:39   US Thoracentesis Asp Pleural Space W/img Guide  Result Date: 02/09/2018 INDICATION: Emphysematous COPD, systolic CHF, and acute hypoxic respiratory failure. Left pleural effusion. Request therapeutic thoracentesis EXAM: ULTRASOUND GUIDED LEFT THORACENTESIS MEDICATIONS: 1% lidocaine 10 mL COMPLICATIONS: None immediate. PROCEDURE: An ultrasound guided thoracentesis was thoroughly discussed with the patient and questions answered. The benefits, risks, alternatives and complications were also discussed. The patient understands and wishes to proceed with the procedure. Written consent was obtained. Ultrasound was performed to localize and mark an adequate pocket of fluid in the left chest. The area was then prepped and draped in the normal sterile fashion. 1% Lidocaine was used for local anesthesia. Under ultrasound guidance a 6 Fr Safe-T-Centesis catheter was introduced. Thoracentesis was performed. The catheter was removed and a dressing applied. FINDINGS: A total of approximately 2.1 L of hazy yellow fluid was removed. IMPRESSION: Successful ultrasound guided left thoracentesis yielding 2.1 L of pleural fluid. No pneumothorax on post-procedure chest x-ray. Read by: Gareth Eagle, PA-C Electronically Signed   By: Corrie Mckusick D.O.   On: 02/09/2018 15:44     Subjective: Unable to assess  Discharge Exam: Vitals:   02/12/18 1000 02/12/18 1125  BP: (!) 143/113   Pulse: (!) 103   Resp: 20   Temp:  98.2 F (36.8 C)  SpO2: (!) 89%    Vitals:   02/12/18 0800 02/12/18 0900 02/12/18 1000 02/12/18 1125  BP: (!) 129/106 (!) 127/94 (!) 143/113   Pulse: 94 92 (!) 103   Resp: 17  11 20    Temp:    98.2 F (36.8 C)  TempSrc:    Axillary  SpO2: 90% 94% (!) 89%   Weight:      Height:        General: Pt is alert, awake, not in acute distress Cardiovascular: RRR, S1/S2 +, no rubs, no gallops  Respiratory: CTA bilaterally, no wheezing, no rhonchi Abdominal: Soft, NT, ND, bowel sounds + Extremities: no edema, no cyanosis   The results of significant diagnostics from this hospitalization (including imaging, microbiology, ancillary and laboratory) are listed below for reference.     Microbiology: Recent Results (from the past 240 hour(s))  MRSA PCR Screening     Status: None   Collection Time: 02/09/18  4:07 AM  Result Value Ref Range Status   MRSA by PCR NEGATIVE NEGATIVE Final    Comment:        The GeneXpert MRSA Assay (FDA approved for NASAL specimens only), is one component of a comprehensive MRSA colonization surveillance program. It is not intended to diagnose MRSA infection nor to guide or monitor treatment for MRSA infections. Performed at [facility], [place] [address] [place], [place] [address]   Culture, body fluid-bottle     Status: None (Preliminary result)   Collection Time: 02/09/18  2:49 PM  Result Value Ref Range Status   Specimen Description FLUID PLEURAL LEFT  Final   Special Requests BOTTLES DRAWN AEROBIC AND ANAEROBIC  Final   Gram Stain PENDING  Incomplete   Culture NO GROWTH 3 DAYS  Final   Report Status PENDING  Incomplete  Gram stain     Status: None   Collection Time: 02/09/18  2:49 PM  Result Value Ref Range Status   Specimen Description FLUID PLEURAL LEFT  Final   Special Requests NONE  Final   Gram Stain   Final    NO WBC SEEN NO ORGANISMS SEEN CYTOSPIN SMEAR Performed at [facility], [place] [address] [place], [place] [address]    Report Status 02/09/2018 FINAL  Final     Labs: BNP (last 3 results) Recent Labs    01/20/18 0902  BNP 098.1*   Basic Metabolic Panel: Recent Labs  Lab  02/06/18 0523 02/07/18 0516 02/09/18 0428 02/10/18 0423 02/11/18 0359  NA 137 135 133* 133* 131*  K 4.3 4.3 5.6* 4.1 4.3  CL 101 102 100 101 98  CO2 26 22 26 25 27   GLUCOSE 100* 101* 132* 134* 126*  BUN 47* 45* 47* 42* 36*  CREATININE 1.29* 1.22 1.55* 1.36* 1.30*  CALCIUM 8.7* 8.6* 9.0 8.9 8.8*   Liver Function Tests: No results for input(s): AST, ALT, ALKPHOS, BILITOT, PROT, ALBUMIN in the last 168 hours. No results for input(s): LIPASE, AMYLASE in the last 168 hours. No results for input(s): AMMONIA in the last 168 hours. CBC: Recent Labs  Lab 02/07/18 0516 02/09/18 0428 02/10/18 0423 02/11/18 0359  WBC 12.5* 10.6* 11.9* 11.7*  HGB 12.2* 11.8* 10.5* 10.6*  HCT 41.1 39.7 34.4* 34.5*  MCV 99.0 101.0* 98.9 95.6  PLT 168 123* 94* 77*   Cardiac Enzymes: No results for input(s): CKTOTAL, CKMB, CKMBINDEX, TROPONINI in the last 168 hours. BNP: Invalid input(s): POCBNP CBG: No results for input(s): GLUCAP in the last 168 hours. D-Dimer No results for input(s): DDIMER in the last 72 hours. Hgb A1c No results for input(s): HGBA1C in the last 72 hours. Lipid Profile No results for input(s): CHOL, HDL, LDLCALC, TRIG, CHOLHDL, LDLDIRECT in the last 72 hours. Thyroid function studies No results for input(s): TSH, T4TOTAL, T3FREE, THYROIDAB in the last 72 hours.  Invalid input(s): FREET3 Anemia work up No results for input(s): VITAMINB12, FOLATE, FERRITIN, TIBC, IRON, RETICCTPCT in the last 72 hours. Urinalysis    Component Value Date/Time   COLORURINE YELLOW 10/15/2017 [place]  10/15/2017 1850   LABSPEC 1.032 (H) 10/15/2017 1850   [place] 7.0 10/15/2017 1850   GLUCOSEU NEGATIVE 10/15/2017 1850   HGBUR NEGATIVE 10/15/2017 1850   BILIRUBINUR NEGATIVE 10/15/2017 1850   KETONESUR NEGATIVE 10/15/2017 1850   PROTEINUR NEGATIVE 10/15/2017 1850   NITRITE NEGATIVE 10/15/2017 1850   LEUKOCYTESUR NEGATIVE 10/15/2017 1850   Sepsis Labs Invalid input(s):  PROCALCITONIN,  WBC,  LACTICIDVEN Microbiology Recent Results (from the past 240 hour(s))  MRSA PCR Screening     Status: None   Collection Time: 02/09/18  4:07 AM  Result Value Ref Range Status   MRSA by PCR NEGATIVE NEGATIVE Final    Comment:        The GeneXpert MRSA Assay (FDA approved for NASAL specimens only), is one component of a comprehensive MRSA colonization surveillance program. It is not intended to diagnose MRSA infection nor to guide or monitor treatment for MRSA infections. Performed at [facility], [place] [address] [place], [place] [address]   Culture, body fluid-bottle     Status: None (Preliminary result)   Collection Time: 02/09/18  2:49 PM  Result Value Ref Range Status   Specimen Description FLUID PLEURAL LEFT  Final   Special Requests BOTTLES DRAWN AEROBIC AND ANAEROBIC  Final   Gram Stain PENDING  Incomplete   Culture NO GROWTH 3 DAYS  Final   Report Status PENDING  Incomplete  Gram stain     Status: None   Collection Time: 02/09/18  2:49 PM  Result Value Ref Range Status   Specimen Description FLUID PLEURAL LEFT  Final   Special Requests NONE  Final   Gram Stain   Final    NO WBC SEEN NO ORGANISMS SEEN CYTOSPIN SMEAR Performed at [facility], [place] [address] [place], [place] [address]    Report Status 02/09/2018 FINAL  Final   Time spent: 72min  SIGNED:   Marylu Lund, MD  [facility] 02/12/2018, 12:51 PM  If 7PM-7AM, please contact night-coverage

## 2018-02-14 ENCOUNTER — Ambulatory Visit: Payer: Medicare Other

## 2018-02-14 ENCOUNTER — Inpatient Hospital Stay: Payer: Medicare Other

## 2018-02-14 ENCOUNTER — Ambulatory Visit: Payer: Non-veteran care | Admitting: Internal Medicine

## 2018-02-15 ENCOUNTER — Ambulatory Visit: Payer: Medicare Other

## 2018-02-15 LAB — CULTURE, BODY FLUID W GRAM STAIN -BOTTLE: Culture: NO GROWTH

## 2018-02-16 ENCOUNTER — Ambulatory Visit: Payer: Medicare Other

## 2018-02-16 ENCOUNTER — Ambulatory Visit: Payer: Non-veteran care | Admitting: Pulmonary Disease

## 2018-02-17 ENCOUNTER — Ambulatory Visit: Payer: Medicare Other

## 2018-02-18 ENCOUNTER — Ambulatory Visit: Payer: Medicare Other

## 2018-02-21 ENCOUNTER — Ambulatory Visit: Payer: Medicare Other

## 2018-02-22 ENCOUNTER — Ambulatory Visit: Payer: Medicare Other

## 2018-02-23 ENCOUNTER — Ambulatory Visit: Payer: Medicare Other

## 2018-02-24 ENCOUNTER — Ambulatory Visit: Payer: Medicare Other

## 2018-02-24 ENCOUNTER — Encounter: Payer: Self-pay | Admitting: Cardiology

## 2018-02-25 ENCOUNTER — Ambulatory Visit: Payer: Medicare Other

## 2018-03-13 DEATH — deceased

## 2018-03-14 ENCOUNTER — Ambulatory Visit: Payer: Non-veteran care | Admitting: Cardiology

## 2018-03-29 ENCOUNTER — Encounter: Payer: Self-pay | Admitting: Radiation Oncology

## 2018-03-29 ENCOUNTER — Ambulatory Visit: Payer: Self-pay | Admitting: Radiation Oncology

## 2018-03-29 NOTE — Progress Notes (Signed)
  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 His radiotherapy was discontinued after 7 treatments.  Plan: The patient died after discontinuing radiation. ________________________________  Jodelle Gross, MD, PhD  This document serves as a record of services personally performed by Kyung Rudd, MD. It was created on his behalf by Rae Lips, a trained medical scribe. The creation of this record is based on the scribe's personal observations and the provider's statements to them. This document has been checked and approved by the attending provider.

## 2018-04-18 ENCOUNTER — Ambulatory Visit: Payer: Non-veteran care | Admitting: Neurology

## 2018-07-19 DIAGNOSIS — R52 Pain, unspecified: Secondary | ICD-10-CM | POA: Insufficient documentation

## 2018-07-19 DIAGNOSIS — Z515 Encounter for palliative care: Secondary | ICD-10-CM | POA: Insufficient documentation

## 2020-08-23 IMAGING — CT CT CTA ABD/PEL W/CM AND/OR W/O CM
2 of 9 series · 12 of 46 positions shown, 14 images · IV contrast (iopamidol)
Comparison: PET-CT 11/17/2017 and previous

CLINICAL DATA: aortic valve replacement and atrial
fibrillation.CVA. noted to have atrial fibrillation and was placed
on apixaban. ejection fraction 45 to 50%, bioprosthetic aortic
valve, moderately dilated ascending aorta at 51 mm, mild mitral
regurgitation, moderate left atrial enlargement and mild pulmonary
hypertension. Chest CT showed left lower lobe lung mass felt likely
bronchogenic carcinoma. There were pulmonary nodules suspicious for
pulmonary metastases. There is also note of adenopathy. Ascending
thoracic aorta measured 5 cm. PET scan revealed left lower lobe
retrohilar mass with adenopathy. It confirmed 5.1 cm ascending
thoracic aortic aneurysm, bilateral internal iliac artery aneurysms
measuring up to 4 cm, left renal artery aneurysm measuring 4.1 cm.
severe emphysema. some dyspnea on exertion but there is no
orthopnea, INJAC, pedal edema, chest pain, palpitations, syncope or
bleeding.

EXAM:
CT ANGIOGRAPHY CHEST, ABDOMEN AND PELVIS
TECHNIQUE: Multidetector CT imaging through the chest, abdomen and pelvis was
performed using the standard protocol during bolus administration of
intravenous contrast. Multiplanar reconstructed images and MIPs were
obtained and reviewed to evaluate the vascular anatomy.
CONTRAST:  100mL 1GF94D-STX IOPAMIDOL (1GF94D-STX) INJECTION 76%

[Series 5: aorta 3.0 i31f 2 · axial · 0.83mm/px · z∈[+1040,+1572]mm · 9 of 201 slices shown, 11 images]
[im 12/201  soft-tissue]
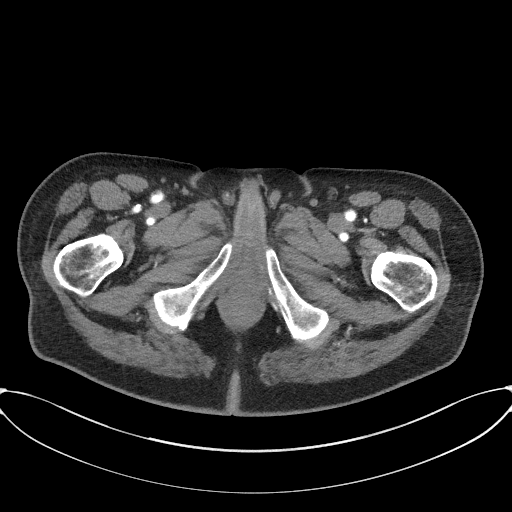
[im 12/201  bone]
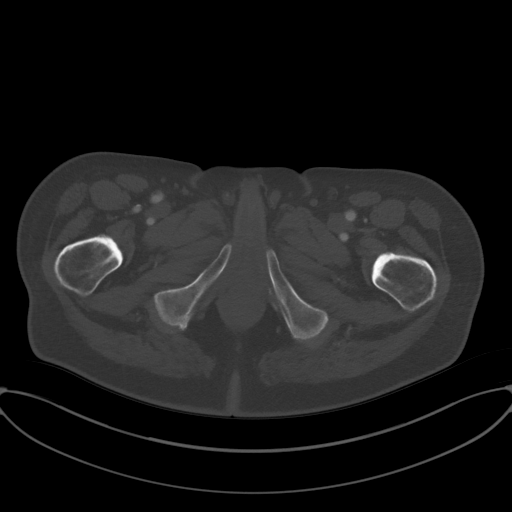
[im 34/201  soft-tissue]
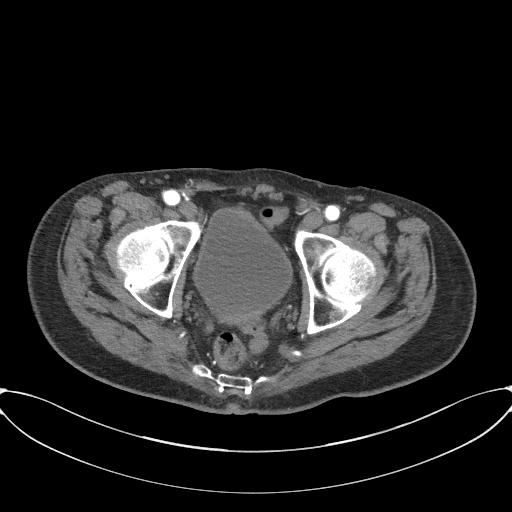
[im 56/201  soft-tissue]
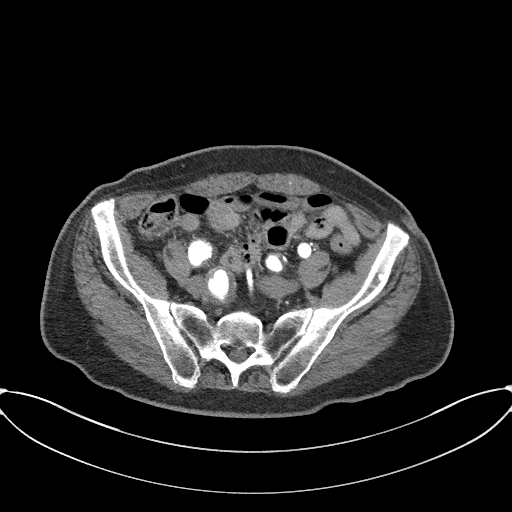
[im 78/201  soft-tissue]
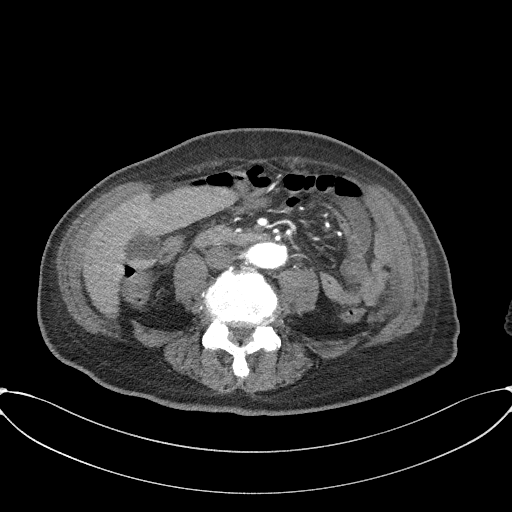
[im 101/201  soft-tissue]
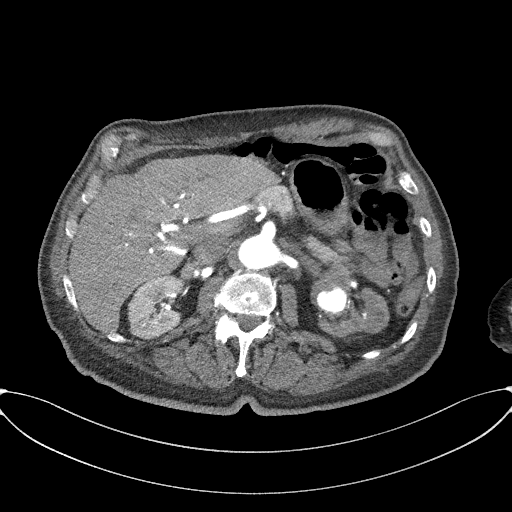
[im 123/201  soft-tissue]
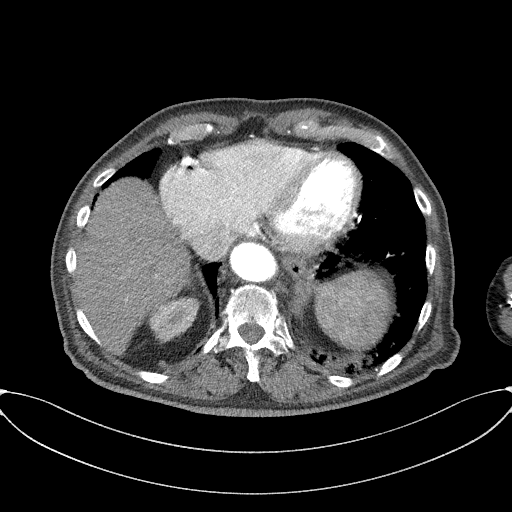
[im 145/201  soft-tissue]
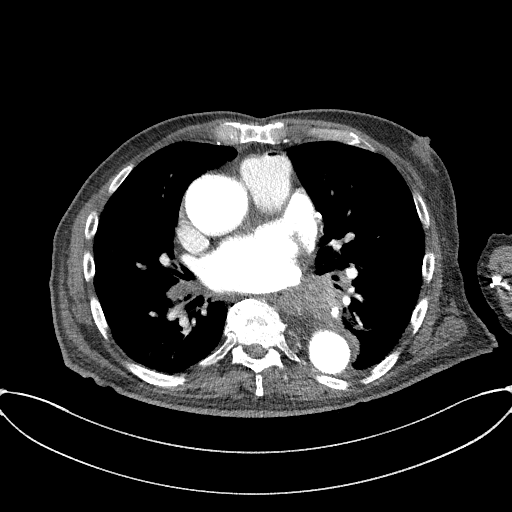
[im 167/201  soft-tissue]
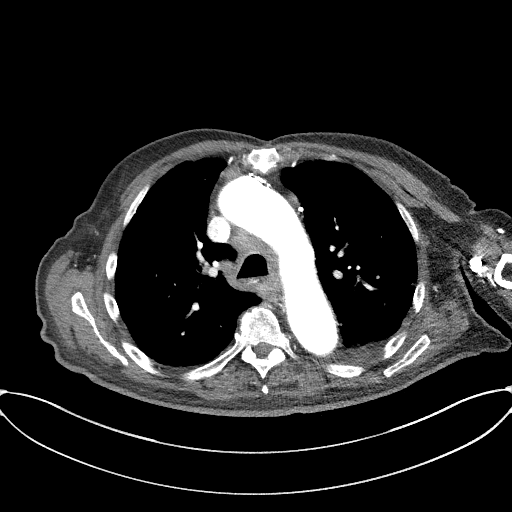
[im 189/201  soft-tissue]
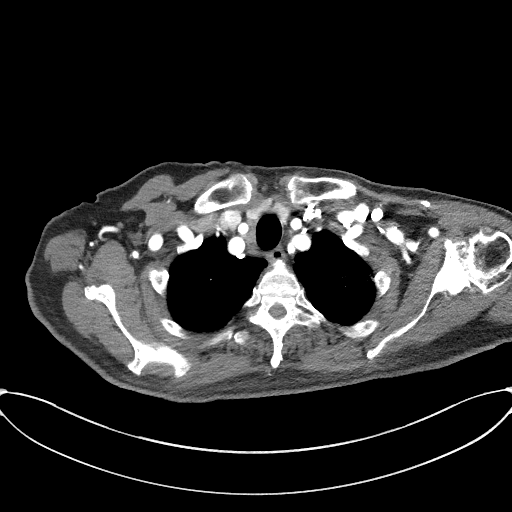
[im 189/201  bone]
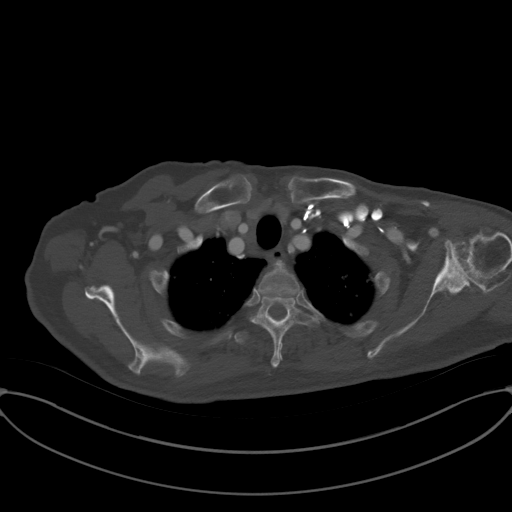

[Series 8: coronals · coronal · 0.81mm/px · 3 of 132 slices shown]
[im 33/132  soft-tissue]
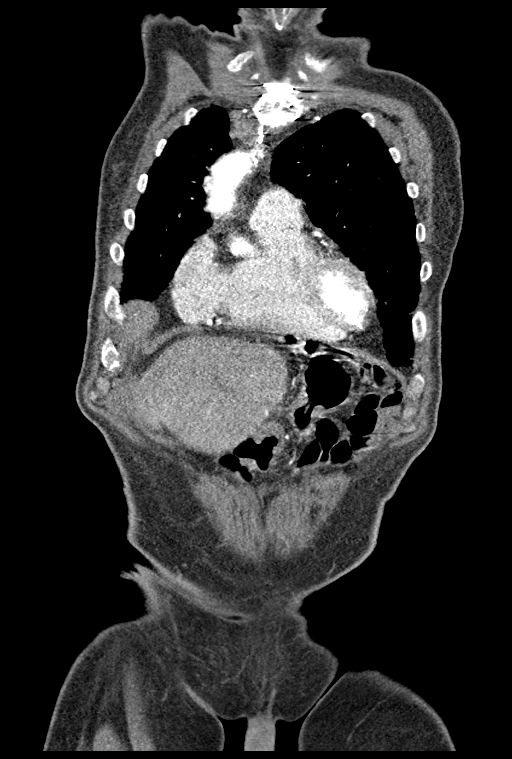
[im 66/132  soft-tissue]
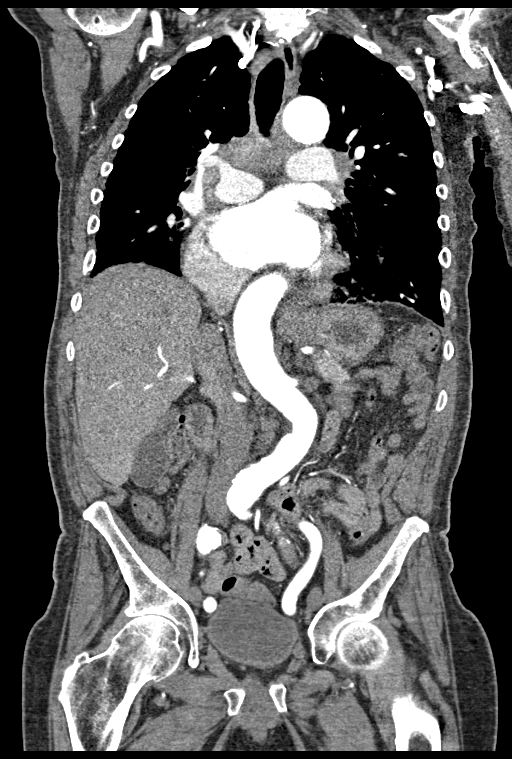
[im 99/132  soft-tissue]
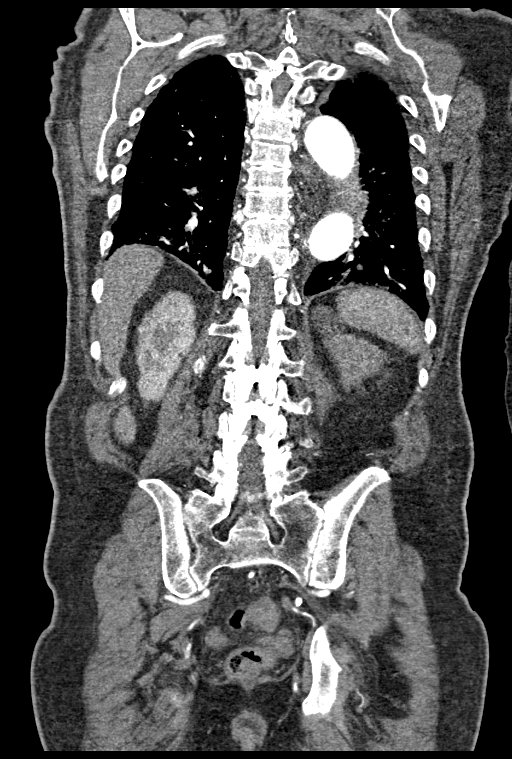

[12 of 46 positions shown; findings below may reference images not displayed]

FINDINGS: CTA CHEST FINDINGS

Cardiovascular: Biatrial cardiac enlargement. There is fair contrast
opacification of the pulmonary arterial tree without any discrete
filling defects identified. The exam was not optimized for detection
of pulmonary emboli. Interval thrombosis of the inferior left
pulmonary vein since CT 10/17/2017. Previous AVR. Good contrast
opacification of the thoracic aorta. Transverse dimensions as
follows:

4.7 cm aortic Root

4.8 cm proximal ascending

5 cm mid ascending

4.3 cm distal ascending/proximal arch

3.5 cm distal arch

3.7 cm proximal descending

3.4 cm distal descending

No dissection or stenosis. Classic 3 vessel brachiocephalic arterial
origin anatomy without proximal stenosis. Tortuous descending
thoracic segment.

Mediastinum/Nodes: Progression of left infrahilar mass measuring at
least 5.3 x 4.3 cm maximum transverse dimensions, with adjacent
confluent left hilar adenopathy. Worsening mediastinal adenopathy,
index nodes:

2.2 cm left supraclavicular node, previously 2 cm

2.4 cm right paratracheal adenopathy previously 2.2 cm

2 cm subcarinal lymph node stable

1.8 cm AP window node previously 1.2 cm.

3 cm right hilar node, previously 2.3 cm.

Lungs/Pleura: Trace left pleural effusion. No pneumothorax. Advanced
pulmonary emphysema. Progressive enlargement of left hilar mass with
narrowing of the distal, left main bronchus encasement of lingular
and left lower lobe bronchi with severe narrowing.

Right middle lobe 9 mm pulmonary nodule, previously 7 mm. Additional
smaller bilateral pulmonary nodules, many demonstrating progression
since prior study.

Musculoskeletal: Previous median sternotomy. Anterior vertebral
endplate spurring at multiple levels in the mid and lower thoracic
spine.

Review of the MIP images confirms the above findings.

CTA ABDOMEN AND PELVIS FINDINGS

VASCULAR

Aorta: Moderate tortuosity. No aneurysm, dissection, or stenosis.
Minimal scattered calcified atheromatous plaque.

Celiac: Patent without evidence of aneurysm, dissection, vasculitis
or significant stenosis.

SMA: Patent without evidence of aneurysm, dissection, vasculitis or
significant stenosis.

Renals: Single right, widely patent, with 1.9 cm saccular aneurysm
at the bifurcation.

Single left, widely patent, with 5 cm partially thrombosed saccular
aneurysm near the renal hilum.

IMA: Patent without evidence of aneurysm, dissection, vasculitis or
significant stenosis.

Inflow: Moderately tortuous iliac arterial system. Fusiform
aneurysmal dilatation of the left common iliac up to 1.9 cm, right
1.8 cm

Partially thrombosed bilateral internal iliac artery aneurysms
measuring 4.1 cm left, 3.6 cm right.

Veins: No obvious venous abnormality within the limitations of this
arterial phase study.

Review of the MIP images confirms the above findings.

NON-VASCULAR

Hepatobiliary: Innumerable subcentimeter stones layer in the
dependent aspect of the nondilated gallbladder. No focal liver
lesion identified. No biliary ductal dilatation.

Pancreas: Unremarkable. No pancreatic ductal dilatation or
surrounding inflammatory changes.

Spleen: Calcified granuloma. Normal size.

Adrenals/Urinary Tract: No definite adrenal mass. No hydronephrosis.
Decreased left renal parenchymal enhancement compared to the right.
2 low-attenuation left renal lesions possibly cysts but incompletely
characterized on this single phase study. Urinary bladder
incompletely distended.

Stomach/Bowel: Stomach is nondilated. Small bowel decompressed.
Normal appendix. Colon is decompressed, unremarkable.

Lymphatic: No abdominal or pelvic adenopathy.

Reproductive: Moderate prostatic enlargement with coarse
calcifications.

Other: No ascites. No free air.

Musculoskeletal: Multilevel lumbar spondylitic change. Fusion across
L5-S1 interspace and posterior elements. Negative for fracture or
worrisome bone lesion.

Review of the MIP images confirms the above findings.
IMPRESSION: 1. Progression of left hilar mass since 10/17/2017, with interval
occlusion of the inferior left pulmonary vein, and narrowing of
distal distal left bronchus and lobar branches.
2. Progressive mediastinal adenopathy and bilateral pulmonary
nodules.
3. 5 cm ascending Ascending thoracic aortic aneurysm. Recommend
semi-annual imaging followup by CTA or MRA and referral to
cardiothoracic surgery if not already obtained. This recommendation
follows 3737 ACCF/AHA/AATS/ACR/ASA/SCA/PINAICOBO/PAULA-ANN/REXH/GZ Guidelines
for the Diagnosis and Management of Patients With Thoracic Aortic
Disease. Circulation. 3737; 121: [id number]
4. Bilateral renal artery renal artery aneurysms, left greater than
right.
5. Bilateral common iliac artery aneurysms, left greater than right.
6. Cholelithiasis.
